# Patient Record
Sex: Female | Born: 1960 | Hispanic: Yes | Marital: Married | Smoking: Never smoker
Health system: Southern US, Community
[De-identification: ages and names within clinical notes are randomized; demographics above are authoritative.]

## PROBLEM LIST (undated history)

## (undated) DIAGNOSIS — E785 Hyperlipidemia, unspecified: Secondary | ICD-10-CM

## (undated) DIAGNOSIS — C55 Malignant neoplasm of uterus, part unspecified: Secondary | ICD-10-CM

## (undated) DIAGNOSIS — C189 Malignant neoplasm of colon, unspecified: Secondary | ICD-10-CM

## (undated) DIAGNOSIS — K5900 Constipation, unspecified: Secondary | ICD-10-CM

## (undated) DIAGNOSIS — Z8049 Family history of malignant neoplasm of other genital organs: Secondary | ICD-10-CM

## (undated) DIAGNOSIS — Z1509 Genetic susceptibility to other malignant neoplasm: Secondary | ICD-10-CM

## (undated) DIAGNOSIS — K219 Gastro-esophageal reflux disease without esophagitis: Secondary | ICD-10-CM

## (undated) DIAGNOSIS — I10 Essential (primary) hypertension: Secondary | ICD-10-CM

## (undated) DIAGNOSIS — E119 Type 2 diabetes mellitus without complications: Secondary | ICD-10-CM

## (undated) DIAGNOSIS — E669 Obesity, unspecified: Secondary | ICD-10-CM

## (undated) DIAGNOSIS — Z5189 Encounter for other specified aftercare: Secondary | ICD-10-CM

## (undated) HISTORY — DX: Encounter for other specified aftercare: Z51.89

## (undated) HISTORY — DX: Family history of malignant neoplasm of other genital organs: Z80.49

## (undated) HISTORY — PX: ABDOMINAL HYSTERECTOMY: SHX81

## (undated) HISTORY — DX: Genetic susceptibility to other malignant neoplasm: Z15.09

## (undated) HISTORY — DX: Malignant neoplasm of colon, unspecified: C18.9

## (undated) HISTORY — PX: MULTIPLE TOOTH EXTRACTIONS: SHX2053

## (undated) HISTORY — DX: Gastro-esophageal reflux disease without esophagitis: K21.9

## (undated) HISTORY — PX: UPPER GASTROINTESTINAL ENDOSCOPY: SHX188

## (undated) HISTORY — PX: OTHER SURGICAL HISTORY: SHX169

## (undated) HISTORY — DX: Hyperlipidemia, unspecified: E78.5

## (undated) HISTORY — PX: COLON SURGERY: SHX602

## (undated) HISTORY — DX: Essential (primary) hypertension: I10

## (undated) HISTORY — DX: Constipation, unspecified: K59.00

## (undated) HISTORY — DX: Malignant neoplasm of uterus, part unspecified: C55

## (undated) HISTORY — DX: Obesity, unspecified: E66.9

---

## 1991-06-16 HISTORY — PX: TUBAL LIGATION: SHX77

## 2004-06-15 HISTORY — PX: FRACTURE SURGERY: SHX138

## 2007-08-10 DIAGNOSIS — C189 Malignant neoplasm of colon, unspecified: Secondary | ICD-10-CM | POA: Insufficient documentation

## 2008-06-15 HISTORY — PX: COLON SURGERY: SHX602

## 2013-08-10 ENCOUNTER — Emergency Department (HOSPITAL_COMMUNITY): Payer: Self-pay

## 2013-08-10 ENCOUNTER — Encounter (HOSPITAL_COMMUNITY): Payer: Self-pay | Admitting: Emergency Medicine

## 2013-08-10 ENCOUNTER — Emergency Department (HOSPITAL_COMMUNITY)
Admission: EM | Admit: 2013-08-10 | Discharge: 2013-08-10 | Disposition: A | Discharge status: Home / Self Care | Payer: Self-pay | Attending: Emergency Medicine | Admitting: Emergency Medicine

## 2013-08-10 DIAGNOSIS — R0602 Shortness of breath: Secondary | ICD-10-CM | POA: Insufficient documentation

## 2013-08-10 DIAGNOSIS — Z79899 Other long term (current) drug therapy: Secondary | ICD-10-CM | POA: Insufficient documentation

## 2013-08-10 DIAGNOSIS — J329 Chronic sinusitis, unspecified: Secondary | ICD-10-CM | POA: Insufficient documentation

## 2013-08-10 DIAGNOSIS — N898 Other specified noninflammatory disorders of vagina: Secondary | ICD-10-CM | POA: Insufficient documentation

## 2013-08-10 DIAGNOSIS — R000 Tachycardia, unspecified: Secondary | ICD-10-CM | POA: Insufficient documentation

## 2013-08-10 LAB — CBC WITH DIFFERENTIAL/PLATELET
BASOS ABS: 0 10*3/uL (ref 0.0–0.1)
Basophils Relative: 0 % (ref 0–1)
EOS PCT: 2 % (ref 0–5)
Eosinophils Absolute: 0.2 10*3/uL (ref 0.0–0.7)
HEMATOCRIT: 40.1 % (ref 36.0–46.0)
Hemoglobin: 13.7 g/dL (ref 12.0–15.0)
Lymphocytes Relative: 27 % (ref 12–46)
Lymphs Abs: 2.4 10*3/uL (ref 0.7–4.0)
MCH: 29.2 pg (ref 26.0–34.0)
MCHC: 34.2 g/dL (ref 30.0–36.0)
MCV: 85.5 fL (ref 78.0–100.0)
MONO ABS: 0.5 10*3/uL (ref 0.1–1.0)
Monocytes Relative: 5 % (ref 3–12)
Neutro Abs: 5.9 10*3/uL (ref 1.7–7.7)
Neutrophils Relative %: 66 % (ref 43–77)
PLATELETS: 241 10*3/uL (ref 150–400)
RBC: 4.69 MIL/uL (ref 3.87–5.11)
RDW: 12.6 % (ref 11.5–15.5)
WBC: 9.1 10*3/uL (ref 4.0–10.5)

## 2013-08-10 LAB — I-STAT CHEM 8, ED
BUN: 7 mg/dL (ref 6–23)
CALCIUM ION: 1.19 mmol/L (ref 1.12–1.23)
Chloride: 101 mEq/L (ref 96–112)
Creatinine, Ser: 0.7 mg/dL (ref 0.50–1.10)
Glucose, Bld: 219 mg/dL — ABNORMAL HIGH (ref 70–99)
HEMATOCRIT: 44 % (ref 36.0–46.0)
Hemoglobin: 15 g/dL (ref 12.0–15.0)
Potassium: 3.8 mEq/L (ref 3.7–5.3)
SODIUM: 139 meq/L (ref 137–147)
TCO2: 23 mmol/L (ref 0–100)

## 2013-08-10 LAB — WET PREP, GENITAL
CLUE CELLS WET PREP: NONE SEEN
TRICH WET PREP: NONE SEEN
Yeast Wet Prep HPF POC: NONE SEEN

## 2013-08-10 LAB — RAPID STREP SCREEN (MED CTR MEBANE ONLY): STREPTOCOCCUS, GROUP A SCREEN (DIRECT): NEGATIVE

## 2013-08-10 LAB — D-DIMER, QUANTITATIVE: D-Dimer, Quant: <0.27 ug/mL-FEU (ref 0.00–0.48)

## 2013-08-10 LAB — URINALYSIS, ROUTINE W REFLEX MICROSCOPIC
Bilirubin Urine: NEGATIVE
GLUCOSE, UA: NEGATIVE mg/dL
HGB URINE DIPSTICK: NEGATIVE
Ketones, ur: NEGATIVE mg/dL
Leukocytes, UA: NEGATIVE
Nitrite: NEGATIVE
Protein, ur: NEGATIVE mg/dL
SPECIFIC GRAVITY, URINE: 1.006 (ref 1.005–1.030)
UROBILINOGEN UA: 0.2 mg/dL (ref 0.0–1.0)
pH: 6.5 (ref 5.0–8.0)

## 2013-08-10 MED ORDER — HYDROCODONE-ACETAMINOPHEN 5-325 MG PO TABS: 1.0000 | ORAL_TABLET | ORAL | Status: DC | PRN

## 2013-08-10 MED ORDER — SULFAMETHOXAZOLE-TRIMETHOPRIM 800-160 MG PO TABS: 1.0000 | ORAL_TABLET | Freq: Two times a day (BID) | ORAL | Status: DC

## 2013-08-10 MED ORDER — MORPHINE SULFATE 2 MG/ML IJ SOLN
2.0000 mg | Freq: Once | INTRAMUSCULAR | Status: AC
  Administered 2013-08-10: 2 mg via INTRAVENOUS
  Filled 2013-08-10: qty 1

## 2013-08-10 MED ORDER — SODIUM CHLORIDE 0.9 % IV BOLUS (SEPSIS)
1000.0000 mL | Freq: Once | INTRAVENOUS | Status: AC
  Administered 2013-08-10: 1000 mL via INTRAVENOUS

## 2013-08-10 NOTE — ED Notes (Signed)
Patient states has had sore throat x 2 wks.   Patient states is short of breath and worsens at night.   Patient is tachycardic on arrival.   Patient denies fever, nausea, vomiting.  Does state that she had chills recently.

## 2013-08-10 NOTE — ED Notes (Signed)
MD at bedside. 

## 2013-08-10 NOTE — ED Notes (Signed)
Obtaining the Interpreter phone

## 2013-08-10 NOTE — ED Provider Notes (Signed)
CSN: [id number]     Arrival date & time 08/10/13  1243 History   First MD Initiated Contact with Patient 08/10/13 1306     Chief Complaint  Patient presents with  . Shortness of Breath  . Tachycardia  . Sore Throat     (Consider location/radiation/quality/duration/timing/severity/associated sxs/prior Treatment) Patient is a 53 y.o. female presenting with pharyngitis. The history is provided by the patient. A language interpreter was used Designer, multimedia [phone]).  Sore Throat Associated symptoms include coughing and a sore throat. Pertinent negatives include no abdominal pain, myalgias, nausea or vomiting. Associated symptoms comments: She reports sore throat for the past 2 weeks. She states that multiple family members have been ill as well. No fever, nausea or vomiting. She denies abdominal pain, urinary symptoms. She does have a brownish vaginal discharge and is experiencing periodic hot flashes and is concerned she is going through menopause. Marland Kitchen    History reviewed. No pertinent past medical history. Past Surgical History  Procedure Laterality Date  . Arm surgery    . Colon surgery     No family history on file. History  Substance Use Topics  . Smoking status: Not on file  . Smokeless tobacco: Not on file  . Alcohol Use: Not on file   OB History   Grav Para Term Preterm Abortions TAB SAB Ect Mult Living                 Review of Systems  HENT: Positive for postnasal drip, sinus pressure and sore throat. Negative for trouble swallowing.   Respiratory: Positive for cough. Negative for shortness of breath.   Cardiovascular: Negative for palpitations.  Gastrointestinal: Negative for nausea, vomiting and abdominal pain.  Genitourinary: Negative for dysuria.  Musculoskeletal: Negative for myalgias.      Allergies  Review of patient's allergies indicates no known allergies.  Home Medications   Current Outpatient Rx  Name  Route  Sig  Dispense  Refill  . OVER THE  COUNTER MEDICATION   Oral   Take 1 tablet by mouth 2 (two) times daily. Pt takes one in the morning and one at night for menopause symptoms         . OVER THE COUNTER MEDICATION   Oral   Take 1 tablet by mouth 2 (two) times daily. Pt takes for cold symptoms          BP 132/88  Pulse 131  Temp(Src) 98.5 F (36.9 C) (Oral)  Resp 18  SpO2 99% Physical Exam  Constitutional: She is oriented to person, place, and time. She appears well-developed and well-nourished.  HENT:  Head: Normocephalic.  Mouth/Throat: Oropharynx is clear and moist.  Eyes: Conjunctivae are normal.  Neck: Normal range of motion. Neck supple. No thyromegaly present.  Cardiovascular: Regular rhythm.  Tachycardia present.   Pulmonary/Chest: Effort normal and breath sounds normal.  Abdominal: Soft. Bowel sounds are normal. There is no tenderness. There is no rebound and no guarding.  Musculoskeletal: Normal range of motion.  Neurological: She is alert and oriented to person, place, and time.  Skin: Skin is warm and dry. No rash noted.  Psychiatric: She has a normal mood and affect.    ED Course  Procedures (including critical care time) Labs Review Labs Reviewed  WET PREP, GENITAL - Abnormal; Notable for the following:    WBC, Wet Prep HPF POC MODERATE (*)    All other components within normal limits  I-STAT CHEM 8, ED - Abnormal; Notable for the following:  Glucose, Bld 219 (*)    All other components within normal limits  RAPID STREP SCREEN  CULTURE, GROUP A STREP  GC/CHLAMYDIA PROBE AMP  CBC WITH DIFFERENTIAL  D-DIMER, QUANTITATIVE  URINALYSIS, ROUTINE W REFLEX MICROSCOPIC  TSH  T4, FREE   Results for orders placed during the hospital encounter of 08/10/13  RAPID STREP SCREEN      Result Value Ref Range   Streptococcus, Group A Screen (Direct) NEGATIVE  NEGATIVE  WET PREP, GENITAL      Result Value Ref Range   Yeast Wet Prep HPF POC NONE SEEN  NONE SEEN   Trich, Wet Prep NONE SEEN  NONE  SEEN   Clue Cells Wet Prep HPF POC NONE SEEN  NONE SEEN   WBC, Wet Prep HPF POC MODERATE (*) NONE SEEN  CBC WITH DIFFERENTIAL      Result Value Ref Range   WBC 9.1  4.0 - 10.5 K/uL   RBC 4.69  3.87 - 5.11 MIL/uL   Hemoglobin 13.7  12.0 - 15.0 g/dL   HCT 72.6  20.3 - 55.9 %   MCV 85.5  78.0 - 100.0 fL   MCH 29.2  26.0 - 34.0 pg   MCHC 34.2  30.0 - 36.0 g/dL   RDW 74.1  63.8 - 45.3 %   Platelets 241  150 - 400 K/uL   Neutrophils Relative % 66  43 - 77 %   Neutro Abs 5.9  1.7 - 7.7 K/uL   Lymphocytes Relative 27  12 - 46 %   Lymphs Abs 2.4  0.7 - 4.0 K/uL   Monocytes Relative 5  3 - 12 %   Monocytes Absolute 0.5  0.1 - 1.0 K/uL   Eosinophils Relative 2  0 - 5 %   Eosinophils Absolute 0.2  0.0 - 0.7 K/uL   Basophils Relative 0  0 - 1 %   Basophils Absolute 0.0  0.0 - 0.1 K/uL  D-DIMER, QUANTITATIVE      Result Value Ref Range   D-Dimer, Quant <0.27  0.00 - 0.48 ug/mL-FEU  URINALYSIS, ROUTINE W REFLEX MICROSCOPIC      Result Value Ref Range   Color, Urine YELLOW  YELLOW   APPearance CLEAR  CLEAR   Specific Gravity, Urine 1.006  1.005 - 1.030   pH 6.5  5.0 - 8.0   Glucose, UA NEGATIVE  NEGATIVE mg/dL   Hgb urine dipstick NEGATIVE  NEGATIVE   Bilirubin Urine NEGATIVE  NEGATIVE   Ketones, ur NEGATIVE  NEGATIVE mg/dL   Protein, ur NEGATIVE  NEGATIVE mg/dL   Urobilinogen, UA 0.2  0.0 - 1.0 mg/dL   Nitrite NEGATIVE  NEGATIVE   Leukocytes, UA NEGATIVE  NEGATIVE  I-STAT CHEM 8, ED      Result Value Ref Range   Sodium 139  137 - 147 mEq/L   Potassium 3.8  3.7 - 5.3 mEq/L   Chloride 101  96 - 112 mEq/L   BUN 7  6 - 23 mg/dL   Creatinine, Ser 6.46  0.50 - 1.10 mg/dL   Glucose, Bld 803 (*) 70 - 99 mg/dL   Calcium, Ion 2.12  2.48 - 1.23 mmol/L   TCO2 23  0 - 100 mmol/L   Hemoglobin 15.0  12.0 - 15.0 g/dL   HCT 25.0  03.7 - 04.8 %    Imaging Review Dg Chest 2 View  08/10/2013   CLINICAL DATA:  Shortness of breath, tachycardia  EXAM: CHEST  2 VIEW  COMPARISON:  None.  FINDINGS:  The heart size and mediastinal contours are within normal limits. Both lungs are clear. The visualized skeletal structures are unremarkable.  IMPRESSION: No active cardiopulmonary disease.   Electronically Signed   By: Daryll Brod M.D.   On: 08/10/2013 14:41    EKG Interpretation    Date/Time:  Thursday August 10 2013 13:23:42 EST Ventricular Rate:  115 PR Interval:  146 QRS Duration: 74 QT Interval:  331 QTC Calculation: 458 R Axis:   63 Text Interpretation:  Sinus tachycardia LAE, consider biatrial enlargement Confirmed by DELOS  MD, DOUGLAS ([id number]) on 08/10/2013 2:08:20 PM            MDM   Final diagnoses:  None    1. Sinusitis 2. Tachycardia 3. Vaginal discharge  Tachycardia improves with IV fluids. She is evaluated by Dr. Stark Jock who feels symptoms of sore throat, sinus pressure and congestion x 2 weeks warrants abx treatment. Thyroid studies pending for follow up with primary care. She had a concern for menopausal symptoms which cannot be confirmed in the ED, however, are not thought contributory to sore throat or tachycardia. Patient is stable for discharge home.     Dewaine Oats, PA-C 08/10/13 1648

## 2013-08-10 NOTE — Discharge Instructions (Signed)
Grgaras de agua con sal ([place] Gargle) Esta solucin har que sienta alivio en la boca y la garganta. INSTRUCCIONES PARA EL CUIDADO DOMICILIARIO  Mezcle 1 cucharadita de sal en 8 onzas de agua tibia.  Haga grgaras con esta solucin con la frecuencia que necesite o segn le hayan indicado. Hgalo suavemente si tiene lesiones o lastimaduras en la boca.  No trague esta solucin. Document Released: 09/17/2008 Document Revised: 08/24/2011 [facility] Patient Information 2014 [place], [place].  Sinusitis  (Sinusitis) La sinusitis es el enrojecimiento, sensibilidad e hinchazn (inflamacin) de los senos paranasales. Los senos paranasales son bolsas de aire que se encuentran dentro de los huesos de la cara (por debajo de los ojos, en la mitad de la frente o por encima de los ojos). En los senos paranasales sanos, el moco puede drenar y el aire circula a travs de ellos en su camino hacia la Lawyer. Sin embargo, cuando se [place], el moco y el aire [place]. Esto hace que se desarrollen bacterias y otros grmenes y originen una infeccin.   La sinusitis puede desarrollarse rpidamente y durar slo un tiempo corto (aguda) o continuar por un perodo largo (crnica). La sinusitis que dura ms de 12 semanas se considera crnica.  CAUSAS  Las causas de la sinusitis son:   Advertising account executive estructurales, como el desplazamiento del cartlago que separa las fosas nasales (desvo del tabique) pueden disminuir el flujo de aire por la nariz y los senos paranasales y Print production planner su drenaje.  Las alteraciones funcionales, como cuando los pequeos pelos (cilias) que se encuentran en los senos nasales y que ayudan a eliminar la mucosidad no funcionan correctamente o no estn presentes. SNTOMAS  Los sntomas de sinusitis aguda y crnica son los mismos. Los sntomas principales son Conservation officer, historic buildings y la presin alrededor de los senos paranasales afectados. Otros sntomas son:   Agricultural consultant.  Dolor de odos.  Dolor de [place].  Mal aliento.  Disminucin del sentido del olfato y del gusto.  Tos, que empeora al [employer].  Fatiga.  Cristy Hilts.  Drenaje de moco espeso por la nariz, que generalmente es de color verde y puede contener pus (purulento).  Hinchazn y calor en los senos paranasales afectados. DIAGNSTICO  El mdico le har un examen fsico. Durante el examen, el mdico:   Revisar su nariz buscando signos de crecimientos anormales en las fosas nasales (plipos nasales).  Palpar los senos paranasales afectados para buscar signos de infeccin.  Observar el interior de los senos paranasales (endoscopa) con un dispositivo especial que emite luz (endoscopio) colocndolo dentro de los senos paranasales. Si el mdico sospecha que usted sufre sinusitis crnica, podr indicar una o ms de las siguientes pruebas:   Pruebas de Buyer, retail.  Cultivo de secreciones nasales: tomar Truddie Coco del moco nasal y la enviar a un laboratorio para detectar bacterias.  Citologa nasal: el mdico tomar [place] de moco de la nariz para determinar si la sinusitis que usted sufre est relacionada con Obie Dredge. TRATAMIENTO  La mayora de los casos de sinusitis aguda se deben a una infeccin viral y se resuelven espontneamente dentro de los 10 das. En algunos casos se recetan medicamentos para [employer] (analgsicos, descongestivos, aerosoles nasales con corticoides o aerosoles salinos).  Sin embargo, para la sinusitis por infeccin bacteriana, Camera operator. Los antibiticos son medicamentos que destruyen las bacterias que causan la infeccin.  Rara vez la sinusitis tiene su origen en una infeccin por hongos.  En estos casos, Designer, television/film set un medicamento antifngico.  Para algunos casos de sinusitis crnica, es necesario someterse a [place]. Generalmente se trata de [employer] la sinusitis se repite ms de 3 veces al  ao, a pesar de otros tratamientos.  INSTRUCCIONES PARA EL CUIDADO EN EL HOGAR   Tiene que beber gran cantidad de agua. Los lquidos ayudan a Psychologist, educational moco para que drene ms fcilmente de los senos paranasales.  Use un humidificador.  Inhale vapor de 3 a 4 veces al da (por ejemplo, sintese en el bao con la ducha abierta).  Aplique un pao tibio y hmedo en su cara 3  4 veces al da, o segn las indicaciones de su mdico.  Use un aerosol nasal salino para ayudar a Air cabin crew y [employer] senos nasales.  Tome medicamentos de venta libre o recetados para Haematologist, Health and safety inspector o la fiebre slo segn las indicaciones de su mdico. SOLICITE ATENCIN MDICA DE INMEDIATO SI:   Siente ms dolor o sufre dolores de cabeza intensos.  Tiene nuseas, vmitos o somnolencia.  Observa hinchazn alrededor del rostro.  Tiene problemas de visin.  Presenta rigidez en el cuello.  Tiene dificultad para respirar. ASEGRESE DE QUE:   Comprende estas instrucciones.  Controlar su enfermedad.  Recibir ayuda de inmediato si no mejora o si empeora. Document Released: 03/11/2005 Document Revised: 02/01/2013 [facility] Patient Information 2014 [place], [place].

## 2013-08-10 NOTE — ED Notes (Signed)
Per pt. And translator phone. Pt is experiencing coughing and sore throat with scant blood in saliva. Pt. Stated no n/v/d or fever. States that her family members have been sick as well.

## 2013-08-10 NOTE — ED Provider Notes (Signed)
MSE was initiated and I personally evaluated the patient and placed orders (if any) at  1:02 PM on August 10, 2013.  The patient appears stable so that the remainder of the MSE may be completed by another provider.   Streeter phone interpreter was used. Pt with URI sxs including nasal congestion, sore throat, cough productive with phlegm but occasional hemoptysis x 2 weeks.  No prior hx of PE or , and no significant risk factors for PE but pt is tachycardic and c/o SOB.  No fever.  Pt will need further work up to r/o PE.   BP 141/100  Pulse 131  Temp(Src) 98.5 F (36.9 C) (Oral)  Resp 20  SpO2 99%   Domenic Moras, PA-C 08/10/13 1306

## 2013-08-11 LAB — GC/CHLAMYDIA PROBE AMP
CT PROBE, AMP APTIMA: NEGATIVE
GC Probe RNA: NEGATIVE

## 2013-08-11 LAB — TSH: TSH: 3.446 u[IU]/mL (ref 0.350–4.500)

## 2013-08-11 LAB — T4, FREE: Free T4: 1.15 ng/dL (ref 0.80–1.80)

## 2013-08-12 LAB — CULTURE, GROUP A STREP

## 2013-08-12 NOTE — ED Provider Notes (Signed)
Medical screening examination/treatment/procedure(s) were performed by non-physician practitioner and as supervising physician I was immediately available for consultation/collaboration.   EKG Interpretation   Date/Time:  Thursday August 10 2013 13:23:42 EST Ventricular Rate:  115 PR Interval:  146 QRS Duration: 74 QT Interval:  331 QTC Calculation: 458 R Axis:   63 Text Interpretation:  Sinus tachycardia LAE, consider biatrial enlargement  Confirmed by Beau Fanny  MD, DOUGLAS ([id number]) on 08/10/2013 2:08:20 PM        Houston Siren III, MD 08/12/13 [phone]

## 2013-08-16 NOTE — ED Provider Notes (Signed)
Medical screening examination/treatment/procedure(s) were conducted as a shared visit with non-physician practitioner(s) and myself.  I personally evaluated the patient during the encounter. Patient presents with complaints of sore throat and vaginal discharge. This is been going on for the past 2 weeks. She denies fevers or chills. She denies vomiting or diarrhea. She does not speak [place] and history was taken with the use of the translator line.  On exam vitals are stable the patient is afebrile. Head is atraumatic normocephalic. Heart is regular rate and rhythm and lungs are clear. Oropharynx is erythematous without exudates. Abdomen is soft nontender nondistended. Extremities are without edema.  Workup reveals unremarkable laboratory studies and WBCs on the wet prep. Due to the length of symptoms she will be treated with antibiotics for suspected sinusitis. She was initially tachycardic upon presentation and this improved with IV hydration.    EKG Interpretation   Date/Time:  Thursday August 10 2013 13:23:42 EST Ventricular Rate:  115 PR Interval:  146 QRS Duration: 74 QT Interval:  331 QTC Calculation: 458 R Axis:   63 Text Interpretation:  Sinus tachycardia LAE, consider biatrial enlargement  Confirmed by Beau Fanny  MD, Nhyla Nappi ([id number]) on 08/10/2013 2:08:20 PM       Veryl Speak, MD 08/16/13 [phone]

## 2014-06-15 DIAGNOSIS — C55 Malignant neoplasm of uterus, part unspecified: Secondary | ICD-10-CM

## 2014-06-15 HISTORY — DX: Malignant neoplasm of uterus, part unspecified: C55

## 2014-07-25 ENCOUNTER — Inpatient Hospital Stay (HOSPITAL_COMMUNITY)
Admission: AD | Admit: 2014-07-25 | Discharge: 2014-07-25 | Disposition: A | Discharge status: Home / Self Care | Payer: Self-pay | Source: Ambulatory Visit | Attending: Obstetrics & Gynecology | Admitting: Obstetrics & Gynecology

## 2014-07-25 DIAGNOSIS — R1030 Lower abdominal pain, unspecified: Secondary | ICD-10-CM | POA: Insufficient documentation

## 2014-07-25 DIAGNOSIS — M549 Dorsalgia, unspecified: Secondary | ICD-10-CM | POA: Insufficient documentation

## 2014-07-25 DIAGNOSIS — N950 Postmenopausal bleeding: Secondary | ICD-10-CM | POA: Insufficient documentation

## 2014-07-25 DIAGNOSIS — N939 Abnormal uterine and vaginal bleeding, unspecified: Secondary | ICD-10-CM

## 2014-07-25 LAB — CBC
HCT: 41.3 % (ref 36.0–46.0)
Hemoglobin: 13.9 g/dL (ref 12.0–15.0)
MCH: 29.6 pg (ref 26.0–34.0)
MCHC: 33.7 g/dL (ref 30.0–36.0)
MCV: 88.1 fL (ref 78.0–100.0)
PLATELETS: 225 10*3/uL (ref 150–400)
RBC: 4.69 MIL/uL (ref 3.87–5.11)
RDW: 12.7 % (ref 11.5–15.5)
WBC: 6.7 10*3/uL (ref 4.0–10.5)

## 2014-07-25 LAB — POCT PREGNANCY, URINE: Preg Test, Ur: NEGATIVE

## 2014-07-25 LAB — URINALYSIS, ROUTINE W REFLEX MICROSCOPIC
Bilirubin Urine: NEGATIVE
Glucose, UA: NEGATIVE mg/dL
Ketones, ur: NEGATIVE mg/dL
LEUKOCYTES UA: NEGATIVE
Nitrite: NEGATIVE
PH: 6 (ref 5.0–8.0)
Protein, ur: NEGATIVE mg/dL
Specific Gravity, Urine: 1.01 (ref 1.005–1.030)
UROBILINOGEN UA: 0.2 mg/dL (ref 0.0–1.0)

## 2014-07-25 LAB — WET PREP, GENITAL
Clue Cells Wet Prep HPF POC: NONE SEEN
TRICH WET PREP: NONE SEEN
Yeast Wet Prep HPF POC: NONE SEEN

## 2014-07-25 LAB — URINE MICROSCOPIC-ADD ON

## 2014-07-25 MED ORDER — IBUPROFEN 600 MG PO TABS: 600.0000 mg | ORAL_TABLET | Freq: Four times a day (QID) | ORAL | Status: DC | PRN

## 2014-07-25 NOTE — MAU Note (Signed)
Pt states she is post menopausal, last period was 3 years ago.  Started bleeding again 5 days ago.  Only sees blood on toilet tissue when wiping.  Also having lower abd & back pain.  Noted  Brownish clot today.

## 2014-07-25 NOTE — Discharge Instructions (Signed)
Hemorragia postmenopusica (Postmenopausal Bleeding) El sangrado postmenopusico es el sangrado que tiene una mujer despus de haber entrado en la menopausia. La menopausia es el final de la edad frtil de la [place]. Despus de la menopausia, una mujer deja de ovular y de tener perodos Guadalupe Guerra.  La hemorragia postmenopusica puede tener varias causas. Cualquier tipo de hemorragia postmenopusica, incluso si parece ser un perodo menstrual tpico, es preocupante. Esto lo evaluar el mdico. Cualquier tratamiento depender de la causa del sangrado. INSTRUCCIONES PARA EL CUIDADO EN EL HOGAR Controle su afeccin para ver si hay cambios. Las siguientes indicaciones ayudarn a Chief Strategy Officer que pueda sentir:  Evite las duchas vaginales y el uso de tampones segn lo que le indique su mdico.  Lyons compresas con frecuencia.  Hgase exmenes plvicos regulares y pruebas de [place]apan[place]olaou.  Cumpla con todas las visitas de control, segn le indique su mdico. SOLICITE ATENCIN MDICA SI:   El sangrado dura ms de 1 semana.  Siente dolor abdominal.  Tiene hemorragias [place]. SOLICITE ATENCIN MDICA DE INMEDIATO SI:   Usted tiene fiebre, escalofros, mareos, dolor de cabeza, dolores musculares y Pensions consultant.  Tiene dolor con el sangrado.  Elimina cogulos de [place].  Tiene sangrado y necesita ms de un apsito por hora.  Siente que va a desmayarse. ASEGRESE DE QUE:  Comprende estas instrucciones.  Controlar su afeccin.  Recibir ayuda de inmediato si no mejora o si empeora. Document Released: 11/18/2007 Document Revised: 03/22/2013 [facility] Patient Information 2015 [place]. This information is not intended to replace advice given to you by your health care provider. Make sure you discuss any questions you have with your health care provider.

## 2014-07-25 NOTE — Progress Notes (Signed)
Pt states bleeding is a small amount  Only when she wipes

## 2014-07-25 NOTE — MAU Provider Note (Signed)
Chief Complaint: Vaginal Bleeding; Abdominal Pain; and Back Pain   First Provider Initiated Contact with Patient 07/25/14 1646     SUBJECTIVE HPI: Laura Douglas is a 54 y.o. No obstetric history on file who presents to maternity admissions reporting postmenopausal bleeding.  She reports her last menstrual period was 3 years ago and was heavy.  She has not had any vaginal bleeding for 3 years until 2 weeks ago when she started having dark brown spotting off and on. The bleeding has continued to be light, spotting only, and is accompanied by back pain and abdominal cramping.   She does not have a PCP or Gyn provider currently. She denies vaginal itching/burning, urinary symptoms, h/a, dizziness, n/v, or fever/chills.     No past medical history on file. No past surgical history on file. History   Social History  . Marital Status: Single    Spouse Name: N/A  . Number of Children: N/A  . Years of Education: N/A   Occupational History  . Not on file.   Social History Main Topics  . Smoking status: Not on file  . Smokeless tobacco: Not on file  . Alcohol Use: Not on file  . Drug Use: Not on file  . Sexual Activity: Not on file   Other Topics Concern  . Not on file   Social History Narrative  . No narrative on file   No current facility-administered medications on file prior to encounter.   No current outpatient prescriptions on file prior to encounter.   No Known Allergies  ROS: Pertinent items in HPI  OBJECTIVE Blood pressure 145/87, pulse 96, resp. rate 18, height 5\' 2"  (1.575 m), weight 73.029 kg (161 lb). GENERAL: Well-developed, well-nourished female in no acute distress.  HEENT: Normocephalic HEART: normal rate RESP: normal effort ABDOMEN: Soft, non-tender EXTREMITIES: Nontender, no edema NEURO: Alert and oriented Pelvic exam: Cervix pink, visually closed, without lesion, scant thin brown discharge, vaginal walls and external genitalia normal Bimanual  exam: Cervix 0/long/high, firm, anterior, neg CMT, uterus nontender, nonenlarged, adnexa without tenderness, enlargement, or mass  LAB RESULTS Results for orders placed or performed during the hospital encounter of 07/25/14 (from the past 24 hour(s))  Urinalysis, Routine w reflex microscopic     Status: Abnormal   Collection Time: 07/25/14  3:16 PM  Result Value Ref Range   Color, Urine YELLOW YELLOW   APPearance CLEAR CLEAR   Specific Gravity, Urine 1.010 1.005 - 1.030   pH 6.0 5.0 - 8.0   Glucose, UA NEGATIVE NEGATIVE mg/dL   Hgb urine dipstick SMALL (A) NEGATIVE   Bilirubin Urine NEGATIVE NEGATIVE   Ketones, ur NEGATIVE NEGATIVE mg/dL   Protein, ur NEGATIVE NEGATIVE mg/dL   Urobilinogen, UA 0.2 0.0 - 1.0 mg/dL   Nitrite NEGATIVE NEGATIVE   Leukocytes, UA NEGATIVE NEGATIVE  Urine microscopic-add on     Status: None   Collection Time: 07/25/14  3:16 PM  Result Value Ref Range   Squamous Epithelial / LPF RARE RARE   WBC, UA 0-2 <3 WBC/hpf   RBC / HPF 0-2 <3 RBC/hpf   Bacteria, UA RARE RARE  Pregnancy, urine POC     Status: None   Collection Time: 07/25/14  3:32 PM  Result Value Ref Range   Preg Test, Ur NEGATIVE NEGATIVE  CBC     Status: None   Collection Time: 07/25/14  4:39 PM  Result Value Ref Range   WBC 6.7 4.0 - 10.5 K/uL   RBC 4.69 3.87 -  5.11 MIL/uL   Hemoglobin 13.9 12.0 - 15.0 g/dL   HCT 41.3 36.0 - 46.0 %   MCV 88.1 78.0 - 100.0 fL   MCH 29.6 26.0 - 34.0 pg   MCHC 33.7 30.0 - 36.0 g/dL   RDW 12.7 11.5 - 15.5 %   Platelets 225 150 - 400 K/uL  Wet prep, genital     Status: Abnormal   Collection Time: 07/25/14  4:39 PM  Result Value Ref Range   Yeast Wet Prep HPF POC NONE SEEN NONE SEEN   Trich, Wet Prep NONE SEEN NONE SEEN   Clue Cells Wet Prep HPF POC NONE SEEN NONE SEEN   WBC, Wet Prep HPF POC MODERATE (A) NONE SEEN    A:  1. Postmenopausal bleeding   2. Abnormal uterine bleeding (AUB)    P: D/C home Ibuprofen 600 mg Q 6 hours PRN Outpatient  Pelvic U/S ordered Appt 1:00 on 08/01/14 for follow up in [place] Return to MAU as needed for emergencies     Follow-up Information    Follow up with [facility].   Specialty:  Radiology   Why:  El hospital le llamar con cita o llamar al nmero de abajo. The hospital will call you with appointment or call the number below.   Contact information:   [address] [id number] [place] [phone]      Follow up with [facility].   Specialty:  Obstetrics and Gynecology   Why:  [place] cita a las 1:00 horas del mircoles 08/01/13. Por favor, llame al nmero a continuacin si necesita cambiar la fecha. You have an appointment at 1:00 pm on Wednesday 08/01/13. Please call the number below if you need to reschedule.    Contact information:   [place] [phone]      Follow up with [facility].   Why:  Segn sea necesario para emergencias.  As needed for emergencies   Contact information:   [address] [phone] [place] Certified Nurse-Midwife 07/25/2014  5:16 PM

## 2014-07-26 LAB — GC/CHLAMYDIA PROBE AMP (~~LOC~~) NOT AT ARMC
CHLAMYDIA, DNA PROBE: NEGATIVE
Neisseria Gonorrhea: NEGATIVE

## 2014-08-01 ENCOUNTER — Ambulatory Visit: Payer: Self-pay | Admitting: Obstetrics & Gynecology

## 2014-08-01 ENCOUNTER — Ambulatory Visit (INDEPENDENT_AMBULATORY_CARE_PROVIDER_SITE_OTHER): Payer: Self-pay | Admitting: Obstetrics & Gynecology

## 2014-08-01 ENCOUNTER — Encounter: Payer: Self-pay | Admitting: Obstetrics & Gynecology

## 2014-08-01 ENCOUNTER — Other Ambulatory Visit (HOSPITAL_COMMUNITY)
Admission: RE | Admit: 2014-08-01 | Discharge: 2014-08-01 | Disposition: A | Discharge status: Home / Self Care | Payer: Self-pay | Source: Ambulatory Visit | Attending: Obstetrics & Gynecology | Admitting: Obstetrics & Gynecology

## 2014-08-01 VITALS — BP 150/94 | HR 113 | Temp 98.6°F | Resp 20 | Ht 60.0 in | Wt 164.9 lb

## 2014-08-01 DIAGNOSIS — N950 Postmenopausal bleeding: Secondary | ICD-10-CM | POA: Insufficient documentation

## 2014-08-01 NOTE — Progress Notes (Signed)
   Subjective:    Patient ID: Laura Douglas, female    DOB: 03/29/1961, 54 y.o.   MRN: [id number]  HPI  54 yo HP4 is here for a EMBX. She has had PMB for 2 weeks. She had been menopausal for about 3 years.  Review of Systems     Objective:   Physical Exam   UPT negative, consent signed, time out done There was a small polyp near the os but I was unable to grasp it with uterine dressing forceps. Cervix prepped with betadine and grasped with a single tooth tenaculum Uterus sounded to 9 cm Pipelle used for 3 passes with a very large amount of tissue obtained. She tolerated the procedure well.        Assessment & Plan:  PMB- await EMBX Schedule gyn u/s RTC 2 weeks

## 2014-08-02 ENCOUNTER — Telehealth: Payer: Self-pay | Admitting: General Practice

## 2014-08-02 NOTE — Telephone Encounter (Signed)
Called patient with [facility] for interpreter and informed patient of new appointment of 2/24 @ 2:45

## 2014-08-08 ENCOUNTER — Ambulatory Visit (INDEPENDENT_AMBULATORY_CARE_PROVIDER_SITE_OTHER): Payer: Self-pay | Admitting: Obstetrics & Gynecology

## 2014-08-08 ENCOUNTER — Ambulatory Visit: Payer: Self-pay | Admitting: Obstetrics & Gynecology

## 2014-08-08 ENCOUNTER — Ambulatory Visit (HOSPITAL_COMMUNITY)
Admission: RE | Admit: 2014-08-08 | Discharge: 2014-08-08 | Disposition: A | Discharge status: Home / Self Care | Payer: Self-pay | Source: Ambulatory Visit | Attending: Advanced Practice Midwife | Admitting: Advanced Practice Midwife

## 2014-08-08 DIAGNOSIS — C541 Malignant neoplasm of endometrium: Secondary | ICD-10-CM

## 2014-08-08 DIAGNOSIS — N950 Postmenopausal bleeding: Secondary | ICD-10-CM | POA: Insufficient documentation

## 2014-08-08 NOTE — Patient Instructions (Signed)
Cncer de tero (Uterine Cancer) El cncer de tero es un crecimiento de tejido anormal (tumor) en el tero, que es canceroso ([place]). A diferencia de los tumores no cancerosos (benignos) los tumores malignos pueden diseminarse a otras partes del cuerpo. La pared del tero tiene dos capas de tejido. La capa interna es el endometrio. La capa externa de tejido muscular es el miometrio. El tipo ms frecuente de cncer de tero comienza en el endometrio. Se denomina cncer de endometrio. El que se inicia en el miometrio se llama sarcoma uterino, y es muy raro.  FACTORES DE RIESGO  Aunque la causa exacta del cncer de tero no se conoce, hay una cantidad de factores de riesgo que aumentan la probabilidad de sufrir cncer de tero. Entre los que se incluyen:  La edad. El cncer de tero ocurre principalmente en mujeres mayores de 41 aos.  Tener el endometrio agrandado (hiperplasia de endometrio).  Terapia hormonal.  La obesidad.  Tomar tamoxifeno.  Ser de raza blanca.  Infertilidad.  No haber tenido embarazos.  Inicio del perodo menstrual antes de los 12 aos.  Continuar con los perodos [employer] de los 60 aos.  Antecedentes personales de cncer de ovarios, intestinos o colorrectal.  Tener antecedentes familiares de cncer de tero.  Antecedentes familiares de cncer de colon hereditario sin poliposis.  Sufrir diabetes, hipertensin arterial, enfermedad tiroidea o enfermedad de la vescula biliar.  Uso de anticonceptivos orales en altas dosis por [employer].  Exposicin a la radiacin.  Fumar. SIGNOS Y SNTOMAS   Secrecin o sangrado vaginal anormal. El sangrado puede comenzar como un flujo sanguinolento acuoso, que gradualmente contiene cada vez ms [place].  Todo sangrado luego de la menopausia.  Dificultad o dolor al [employer].  [place].  Dolor en la zona plvica.  Bultos en la vagina.  Dolor o distensin en el  abdomen.  Ganas frecuentes de Garment/textile technologist.  Hemorragias entre los perodos [employer].  Bultos en el estmago.  Prdida de peso sin causa aparente. El cncer de tero generalmente aparece despus de la menopausia. Sin embargo, tambin puede ocurrir en el momento en que la menopausia comienza. El sangrado vaginal anormal es el sntoma ms frecuente. Las mujeres no deben suponer que un sangrado vaginal anormal es parte de la menopausia. DIAGNSTICO  El [employer] preguntar sobre su historia clnica. Tambin podr realizar algunos procedimientos como:  Examen fsico. El mdico palpar la pelvis para ver si hay bultos.  Anlisis de [place] y [place].  Radiografas.  Diagnsticos por imagen, como tomografa computarizada, ecografa o resonancia magntica.  Una histeroscopia para visualizar el interior del tero.  Un Papanicolau para tomar Truddie Coco de clulas del cuello del tero y de la parte superior de la vagina, para ver si hay clulas anormales.  Truddie Coco de tejido (biopsia) de la membrana interna que recubre el tero para ver si hay clulas cancerosas.  Dilatacin y Advertising copywriter (D y L). Consiste en la apertura (dilatacin) del cuello del tero y el raspado (legrado) en la membrana interna del tero para tomar Truddie Coco de tejido. La muestra se examina en el microscopio para diagnosticar si hay clulas cancerosas. El tumor se estadificar para determinar su gravedad y extensin. La estadificacin es un intento cuidadoso para diagnosticar el tamao del tumor, si se ha propagado, y si es as, a Brewing technologist del cuerpo. Es posible que tenga que hacerse ms estudios para determinar el estadio o etapa del cncer. Los resultados de las pruebas ayudarn a Teacher, adult education  qu plan de tratamiento es el mejor para usted. Las etapas son:   Joyice Faster. El cncer se encuentra solamente en el tero.  Estadio II. El cncer se ha diseminado al cuello del tero.  Estadio III. El cncer se ha diseminado al  exterior del tero, pero no fuera de la pelvis. El cncer puede haberse diseminado a los ganglios linfticos en la pelvis.  Estadio IV. El cncer se ha extendido a Airline pilot del cuerpo, como la vejiga o el recto. TRATAMIENTO  El tratamiento de la mayora de las mujeres que sufren cncer de tero es la [place]. Esto incluye la extirpacin del tero, el cuello del tero, las trompas de Falopio y los ovarios (histerectoma total). Tambin le extirparn los ganglios linfticos que se encuentran cerca del tumor. Algunas mujeres recibirn tratamiento de radiacin, quimioterapia o terapia hormonal. Otras mujeres reciben una combinacin de estas terapias. [place] los medicamentos solamente como se lo haya indicado el mdico.  Mantenga una dieta saludable.  Haga ejercicios regularmente.  Si tiene diabetes, hipertensin arterial, enfermedad tiroidea o enfermedad de la vescula biliar, siga las indicaciones de su mdico para mantenerlas bajo control.  No fume.  Considere participar en un grupo de apoyo. Esto puede ayudarla a sobrellevar el estrs que implica sufrir cncer de tero.  Busque asesoramiento que lo ayude a [employer] secundarios del [place].  Concurra a todas las visitas de control como se lo haya indicado el mdico. SOLICITE ATENCIN MDICA SI:  Aumenta el dolor estomacal o plvico.  No puede orinar.  Tiene un sangrado anormal. Document Released: 02/01/2013 Document Revised: 10/16/2013 [facility] Patient Information 2015 [place], [place]. This information is not intended to replace advice given to you by your health care provider. Make sure you discuss any questions you have with your health care provider.

## 2014-08-09 ENCOUNTER — Encounter: Payer: Self-pay | Admitting: Obstetrics & Gynecology

## 2014-08-09 ENCOUNTER — Ambulatory Visit: Payer: Self-pay | Admitting: Obstetrics & Gynecology

## 2014-08-09 NOTE — Progress Notes (Signed)
Patient ID: Laura Douglas, female   DOB: 08-15-1960, 54 y.o.   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  I reviewed with pt her sx and her surg path.  She thought she just had polyps.  I explained to her the need for referral to GYN ONC and explained that she would need surgery.     She was apporpriately upset by the dx.  She asked appropriate questions. The questions were answered to her satisfaction.  She was informed of her visit with GYN ONC.  She reports that she will keep the appt,  82min face to face with pt was spent.  A spanish interpreter was used for the entire conversation.

## 2014-08-13 ENCOUNTER — Ambulatory Visit: Payer: Self-pay | Attending: Gynecologic Oncology | Admitting: Gynecologic Oncology

## 2014-08-13 ENCOUNTER — Ambulatory Visit: Payer: Self-pay | Admitting: Gynecologic Oncology

## 2014-08-13 ENCOUNTER — Encounter: Payer: Self-pay | Admitting: Gynecologic Oncology

## 2014-08-13 VITALS — BP 159/85 | HR 97 | Temp 98.5°F | Resp 20 | Ht 60.0 in | Wt 160.3 lb

## 2014-08-13 DIAGNOSIS — Z8719 Personal history of other diseases of the digestive system: Secondary | ICD-10-CM | POA: Insufficient documentation

## 2014-08-13 DIAGNOSIS — Z808 Family history of malignant neoplasm of other organs or systems: Secondary | ICD-10-CM

## 2014-08-13 DIAGNOSIS — Z9049 Acquired absence of other specified parts of digestive tract: Secondary | ICD-10-CM | POA: Insufficient documentation

## 2014-08-13 DIAGNOSIS — C541 Malignant neoplasm of endometrium: Secondary | ICD-10-CM | POA: Insufficient documentation

## 2014-08-13 DIAGNOSIS — Z8049 Family history of malignant neoplasm of other genital organs: Secondary | ICD-10-CM | POA: Insufficient documentation

## 2014-08-13 DIAGNOSIS — I10 Essential (primary) hypertension: Secondary | ICD-10-CM | POA: Insufficient documentation

## 2014-08-13 NOTE — Patient Instructions (Addendum)
Preparing for your Surgery  Plan for surgery on March 29 with Dr. Skeet Latch.  Pre-operative Testing -You will receive a phone call from presurgical testing at [facility] to arrange for a pre-operative testing appointment before your surgery.  This appointment normally occurs one to two weeks before your scheduled surgery.   -Bring your insurance card, copy of an advanced directive if applicable, medication list  -At that visit, you will be asked to sign a consent for a possible blood transfusion in case a transfusion becomes necessary during surgery.  The need for a blood transfusion is rare but having consent is a necessary part of your care.     -You should not be taking blood thinners or aspirin at least ten days prior to surgery unless instructed by your surgeon.  Day Before Surgery at [place] will be asked to take in only clear liquids the day before surgery.  Examples of clear liquids include broths, jello, and clear juices.  You will be advised to have nothing to eat or drink after midnight the evening before.    Your role in recovery Your role is to become active as soon as directed by your doctor, while still giving yourself time to heal.  Rest when you feel tired. You will be asked to do the following in order to speed your recovery:  - Cough and breathe deeply. This helps toclear and expand your lungs and can prevent pneumonia. You may be given a spirometer to practice deep breathing. A staff member will show you how to use the spirometer. - Do mild physical activity. Walking or moving your legs help your circulation and body functions return to normal. A staff member will help you when you try to walk and will provide you with simple exercises. Do not try to get up or walk alone the first time. - Actively manage your pain. Managing your pain lets you move in comfort. We will ask you to rate your pain on a scale of zero to 10. It is your responsibility to tell  your doctor or nurse where and how much you hurt so your pain can be treated.  Special Considerations -If you are diabetic, you may be placed on insulin after surgery to have closer control over your blood sugars to promote healing and recovery.  This does not mean that you will be discharged on insulin.  If applicable, your oral antidiabetics will be resumed when you are tolerating a solid diet.  -Your final pathology results from surgery should be available by the Friday after surgery and the results will be relayed to you when available.   Cncer de tero (Uterine Cancer) El cncer de tero es un crecimiento de tejido anormal (tumor) en el tero, que es canceroso ([place]). A diferencia de los tumores no cancerosos (benignos) los tumores malignos pueden diseminarse a otras partes del cuerpo. La pared del tero tiene dos capas de tejido. La capa interna es el endometrio. La capa externa de tejido muscular es el miometrio. El tipo ms frecuente de cncer de tero comienza en el endometrio. Se denomina cncer de endometrio. El que se inicia en el miometrio se llama sarcoma uterino, y es muy raro.  FACTORES DE RIESGO  Aunque la causa exacta del cncer de tero no se conoce, hay una cantidad de factores de riesgo que aumentan la probabilidad de sufrir cncer de tero. Entre los que se incluyen:  La edad. El cncer de tero ocurre principalmente en mujeres mayores de  [place]  Tener el endometrio agrandado (hiperplasia de endometrio).  Terapia hormonal.  La obesidad.  Tomar tamoxifeno.  Ser de raza blanca.  Infertilidad.  No haber tenido embarazos.  Inicio del perodo menstrual antes de los 12 aos.  Continuar con los perodos [employer] de los 29 aos.  Antecedentes personales de cncer de ovarios, intestinos o colorrectal.  Tener antecedentes familiares de cncer de tero.  Antecedentes familiares de cncer de colon hereditario sin poliposis.  Sufrir diabetes,  hipertensin arterial, enfermedad tiroidea o enfermedad de la vescula biliar.  Uso de anticonceptivos orales en altas dosis por [employer].  Exposicin a la radiacin.  Fumar. SIGNOS Y SNTOMAS   Secrecin o sangrado vaginal anormal. El sangrado puede comenzar como un flujo sanguinolento acuoso, que gradualmente contiene cada vez ms [place].  Todo sangrado luego de la menopausia.  Dificultad o dolor al [employer].  [place].  Dolor en la zona plvica.  Bultos en la vagina.  Dolor o distensin en el abdomen.  Ganas frecuentes de Garment/textile technologist.  Hemorragias entre los perodos [employer].  Bultos en el estmago.  Prdida de peso sin causa aparente. El cncer de tero generalmente aparece despus de la menopausia. Sin embargo, tambin puede ocurrir en el momento en que la menopausia comienza. El sangrado vaginal anormal es el sntoma ms frecuente. Las mujeres no deben suponer que un sangrado vaginal anormal es parte de la menopausia. DIAGNSTICO  El [employer] preguntar sobre su historia clnica. Tambin podr realizar algunos procedimientos como:  Examen fsico. El mdico palpar la pelvis para ver si hay bultos.  Anlisis de [place] y [place].  Radiografas.  Diagnsticos por imagen, como tomografa computarizada, ecografa o resonancia magntica.  Una histeroscopia para visualizar el interior del tero.  Un Papanicolau para tomar Truddie Coco de clulas del cuello del tero y de la parte superior de la vagina, para ver si hay clulas anormales.  Truddie Coco de tejido (biopsia) de la membrana interna que recubre el tero para ver si hay clulas cancerosas.  Dilatacin y Advertising copywriter (D y L). Consiste en la apertura (dilatacin) del cuello del tero y el raspado (legrado) en la membrana interna del tero para tomar Truddie Coco de tejido. La muestra se examina en el microscopio para diagnosticar si hay clulas cancerosas. El tumor se estadificar para  determinar su gravedad y extensin. La estadificacin es un intento cuidadoso para diagnosticar el tamao del tumor, si se ha propagado, y si es as, a Brewing technologist del cuerpo. Es posible que tenga que hacerse ms estudios para determinar el estadio o etapa del cncer. Los resultados de las pruebas ayudarn a Transport planner plan de tratamiento es el mejor para usted. Las etapas son:   Joyice Faster. El cncer se encuentra solamente en el tero.  Estadio II. El cncer se ha diseminado al cuello del tero.  Estadio III. El cncer se ha diseminado al exterior del tero, pero no fuera de la pelvis. El cncer puede haberse diseminado a los ganglios linfticos en la pelvis.  Estadio IV. El cncer se ha extendido a Airline pilot del cuerpo, como la vejiga o el recto. TRATAMIENTO  El tratamiento de la mayora de las mujeres que sufren cncer de tero es la [place]. Esto incluye la extirpacin del tero, el cuello del tero, las trompas de Falopio y los ovarios (histerectoma total). Tambin le extirparn los ganglios linfticos que se encuentran cerca del tumor. Algunas mujeres recibirn tratamiento de radiacin, quimioterapia o terapia hormonal. Otras mujeres reciben  una combinacin de estas terapias. [place] los medicamentos solamente como se lo haya indicado el mdico.  Mantenga una dieta saludable.  Haga ejercicios regularmente.  Si tiene diabetes, hipertensin arterial, enfermedad tiroidea o enfermedad de la vescula biliar, siga las indicaciones de su mdico para mantenerlas bajo control.  No fume.  Considere participar en un grupo de apoyo. Esto puede ayudarla a sobrellevar el estrs que implica sufrir cncer de tero.  Busque asesoramiento que lo ayude a [employer] secundarios del [place].  Concurra a todas las visitas de control como se lo haya indicado el mdico. SOLICITE ATENCIN MDICA SI:  Aumenta el dolor estomacal o plvico.  No  puede orinar.  Tiene un sangrado anormal. Document Released: 02/01/2013 Document Revised: 10/16/2013 [facility] Patient Information 2015 [place], [place]. This information is not intended to replace advice given to you by your health care provider. Make sure you discuss any questions you have with your health care provider.                 Preparing for your Surgery  Plan for surgery on March 29 with Dr. Skeet Latch.  Pre-operative Testing -You will receive a phone call from presurgical testing at [facility] to arrange for a pre-operative testing appointment before your surgery.  This appointment normally occurs one to two weeks before your scheduled surgery.   -Bring your insurance card, copy of an advanced directive if applicable, medication list  -At that visit, you will be asked to sign a consent for a possible blood transfusion in case a transfusion becomes necessary during surgery.  The need for a blood transfusion is rare but having consent is a necessary part of your care.     -You should not be taking blood thinners or aspirin at least ten days prior to surgery unless instructed by your surgeon.  Day Before Surgery at [place] will be asked to take in only clear liquids the day before surgery.  Examples of clear liquids include broths, jello, and clear juices. You will be advised to have nothing to eat or drink after midnight the evening before.    Your role in recovery Your role is to become active as soon as directed by your doctor, while still giving yourself time to heal.  Rest when you feel tired. You will be asked to do the following in order to speed your recovery:  - Cough and breathe deeply. This helps toclear and expand your lungs and can prevent pneumonia. You may be given a spirometer to practice deep breathing. A staff member will show you how to use the spirometer. - Do mild physical activity. Walking or moving your legs help your circulation and body functions  return to normal. A staff member will help you when you try to walk and will provide you with simple exercises. Do not try to get up or walk alone the first time. - Actively manage your pain. Managing your pain lets you move in comfort. We will ask you to rate your pain on a scale of zero to 10. It is your responsibility to tell your doctor or nurse where and how much you hurt so your pain can be treated.  Special Considerations -If you are diabetic, you may be placed on insulin after surgery to have closer control over your blood sugars to promote healing and recovery.  This does not mean that you will be discharged on insulin.  If applicable,  your oral antidiabetics will be resumed when you are tolerating a solid diet.  -Your final pathology results from surgery should be available by the Friday after surgery and the results will be relayed to you when available.   Transfursin de sangre  (Blood Transfusion) En una transfusin se repone la sangre o algunos de sus componentes. Debe reponerse si ha perdido [place] debido a [place], un accidente, o debido a trastornos graves como la anemia. Puede donar sangre para que se vuelva a Risk manager en usted mismo si tiene planeado realizarse [place]. Si pierde sangre durante esa [place], puede utilizarse su misma sangre para reponerla. Toda la sangre que se utilice en usted se verifica para asegurarse de que concuerde el grupo sanguneo. Se controlarn con frecuencia la temperatura, la presin arterial, y la frecuencia cardaca (signos vitales).  SOLICITE AYUDA DE INMEDIATO SI:   Si tiene malestar estomacal (nuseas) o vmitos.  Tiene materia fecal lquida (diarrea).  Le falta el aire o tiene dificultad para respirar.  Observa sangre en su orina, o tiene orina de color oscuro.  Siente dolor o presin en el pecho.  Sus ojos o su piel se vuelven amarillos (ictericia).  La temperatura oral le sube a ms de 102 F (38.9 C) y no puede bajarla con  medicamentos.  Comienza a temblar y tiene escalofros.  Observa una erupcin roja (urticaria) o siente picazn.  Se siente mareado o confundido.  Siente dolor en la espalda las articulaciones, o los msculos.  No siente hambre (prdida del apetito).  Se siente cansado, inquieto o nervioso.  Tiene clicos en el vientre (abdominales). Document Released: 07/04/2010 Document Revised: 08/24/2011 [facility] Patient Information 2015 [place]. This information is not intended to replace advice given to you by your health care provider. Make sure you discuss any questions you have with your health care provider.

## 2014-08-13 NOTE — Progress Notes (Signed)
Consult Note: Gyn-Onc  Consult was requested by Dr. Hulan Fray for the evaluation of Laura Douglas 54 y.o. 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 endometrial cancer    Assessment/Plan:  [name]  is a 54 y.o.  year old [phone] with clinical stage I endometrial adenocarcinoma and possible personal history of colon cancer (s/p colonic resection in 2009).   A detailed discussion was held with the patient with an interpreter present to translate. Discussion was made with regard to to her endometrial cancer diagnosis. We discussed the standard management options for uterine cancer which includes surgery followed possibly by adjuvant therapy depending on the results of surgery. The options for surgical management include a hysterectomy and removal of the tubes and ovaries possibly with removal of pelvic and para-aortic lymph nodes. A minimally invasive approach including a robotic hysterectomy or laparoscopic hysterectomy have benefits including shorter hospital stay, recovery time and better wound healing. The alternative approach is an open hysterectomy. The patient has been counseled about these surgical options and the risks of surgery in general including infection, bleeding, damage to surrounding structures (including bowel, bladder, ureters, nerves or vessels), and the postoperative risks of PE/ DVT, and lymphedema. I extensively reviewed the additional risks of robotic hysterectomy including possible need for conversion to open laparotomy.  I discussed positioning during surgery of trendelenberg and risks of minor facial swelling and care we take in preoperative positioning.  I discussed that she is at an increased risk for conversion to laparotomy secondary to adhesive disease due to her prior laparotomy and colon resection. After counseling and consideration of her options, she desires  to proceed with robotic hysterectomy, BSO, lymph node dissection.   She will be seen by anesthesia for preoperative clearance and discussion of postoperative pain management.  She was given the opportunity to ask questions, which were answered to her satisfaction, and she is agreement with the above mentioned plan of care.  Given her young age at diagnosis and possible prior history of colon cancer I have concerns that she may carry deletion in a microsatellite instability gene and recommends workup for Lynch syndrome postoperatively.   HPI: Laura Douglas is a 54 year old woman who is seen in consultation at the request of Dr Hulan Fray for endometrial cancer. She has a history of 2 weeks of postmenopausal bleeding in January 2016. She been menopausal for about 3 years prior to that time. She sought evaluation with OB/GYN and Dr. Hulan Fray who performed a transvaginal ultrasound scan which revealed a 7.4 x 2.7 x 4 cm uterus with a 10 mm endometrial stripe. The ovaries were grossly normal bilaterally. To follow up the thickened endometrial stripe Dr. Hulan Fray performed an endometrial [place] biopsy on 08/01/2014 which revealed well-differentiated endometrial adenocarcinoma associated with complex atypical hyperplasia.  The patient has no primary care doctor. She is an immigrant from [place]. She has hypertension but has not been treated for this.  She reports a history of resection of a colon tumor 7 years ago in [place]. She denies HEENT told that she had cancer. She denies any other family history significant for malignancy other than a paternal aunt with cervical cancer.  Current Meds:  Outpatient Encounter Prescriptions as of 08/13/2014  Medication Sig  . ibuprofen (ADVIL,MOTRIN) 600 MG tablet Take 1 tablet (600 mg total) by mouth every 6 (six) hours as needed.    Allergy: No Known Allergies  Social Hx:  History   Social History  . Marital Status: Single    Spouse Name: N/A  . Number of Children: N/A  .  Years of Education: N/A   Occupational History  . Not on file.   Social History Main Topics  . Smoking status: Never Smoker   . Smokeless tobacco: Not on file  . Alcohol Use: Yes     Comment: occasionally  . Drug Use: No  . Sexual Activity: Yes    Birth Control/ Protection: Surgical   Other Topics Concern  . Not on file   Social History Narrative    Past Surgical Hx:  Past Surgical History  Procedure Laterality Date  . Fracture surgery  2006    Lt lower arm  . Colon surgery  2010    tumor removal  . Tubal ligation  1993  . Multiple tooth extractions      Past Medical Hx:  Past Medical History  Diagnosis Date  . Hypertension   . Obesity   . Colon cancer     possible? Patient had surgery in [place]. Denies needing adjuvant therapy. The surgery "removed a tumor from the colon".    Past Gynecological History:  SVD x 4  No LMP recorded. Patient is postmenopausal.  Family Hx:  Family History  Problem Relation Age of Onset  . Cancer Father     prostate  . Diabetes Mother     Review of Systems:  Constitutional  Feels well,    ENT Normal appearing ears and nares bilaterally Skin/Breast  No rash, sores, jaundice, itching, dryness Cardiovascular  No chest pain, shortness of breath, or edema  Pulmonary  No cough or wheeze.  Gastro Intestinal  No nausea, vomitting, or diarrhoea. No bright red blood per rectum, no abdominal pain, change in bowel movement, or constipation.  Genito Urinary  No frequency, urgency, dysuria,  Musculo Skeletal  No myalgia, arthralgia, joint swelling or pain  Neurologic  No weakness, numbness, change in gait,  Psychology  No depression, anxiety, insomnia.   Vitals:  Blood pressure 159/85, pulse 97, temperature 98.5 F (36.9 C), temperature source Oral, resp. rate 20, height 5' (1.524 m), weight 160 lb 4.8 oz (72.712 kg).  Physical Exam: WD in NAD Neck  Supple NROM, without any enlargements.  Lymph Node Survey No cervical  supraclavicular or inguinal adenopathy Cardiovascular  Pulse normal rate, regularity and rhythm. S1 and S2 normal.  Lungs  Clear to auscultation bilateraly, without wheezes/crackles/rhonchi. Good air movement.  Skin  No rash/lesions/breakdown  Psychiatry  Alert and oriented to person, place, and time  Abdomen  Vertical midline incision well healed. Normoactive bowel sounds, abdomen soft, non-tender and obese without evidence of hernia.  Back No CVA tenderness Genito Urinary  Vulva/vagina: Normal external female genitalia.  No lesions. No discharge or bleeding.  Bladder/urethra:  No lesions or masses, well supported bladder  Vagina: normal  Cervix: Normal appearing, no lesions.  Uterus: Small, mobile, no parametrial involvement or nodularity.  Adnexa: no palpable masses. Rectal  Good tone, no masses no cul de sac nodularity.  Extremities  No bilateral cyanosis, clubbing or edema.   Donaciano Eva, MD   08/13/2014, 1:56 PM

## 2014-08-15 ENCOUNTER — Telehealth: Payer: Self-pay | Admitting: *Deleted

## 2014-08-15 NOTE — Telephone Encounter (Signed)
Patient does not have a primary care MD. Made patient an appt on Thursday, 08/23/14 at 10:30am at [facility] at [facility] to establish care. Spoke with Adela Lank and she states patient needs to come by the clinic anytime before her scheduled appt to pick up paperwork. Patient does not speak [place]. Information passed along to Hamel (interpreter) who is agreeable to tell patient.

## 2014-08-16 ENCOUNTER — Ambulatory Visit: Payer: Self-pay | Admitting: Gynecologic Oncology

## 2014-08-17 ENCOUNTER — Ambulatory Visit: Payer: Self-pay | Admitting: Obstetrics & Gynecology

## 2014-08-20 ENCOUNTER — Encounter: Payer: Self-pay | Admitting: *Deleted

## 2014-09-03 ENCOUNTER — Encounter (HOSPITAL_COMMUNITY): Payer: Self-pay

## 2014-09-03 ENCOUNTER — Encounter (HOSPITAL_COMMUNITY)
Admission: RE | Admit: 2014-09-03 | Discharge: 2014-09-03 | Disposition: A | Discharge status: Home / Self Care | Payer: Self-pay | Source: Ambulatory Visit | Attending: Gynecologic Oncology | Admitting: Gynecologic Oncology

## 2014-09-03 ENCOUNTER — Ambulatory Visit (HOSPITAL_COMMUNITY)
Admission: RE | Admit: 2014-09-03 | Discharge: 2014-09-03 | Disposition: A | Discharge status: Home / Self Care | Payer: Self-pay | Source: Ambulatory Visit | Attending: Gynecologic Oncology | Admitting: Gynecologic Oncology

## 2014-09-03 DIAGNOSIS — C541 Malignant neoplasm of endometrium: Secondary | ICD-10-CM

## 2014-09-03 DIAGNOSIS — Z01818 Encounter for other preprocedural examination: Secondary | ICD-10-CM | POA: Insufficient documentation

## 2014-09-03 LAB — URINALYSIS, ROUTINE W REFLEX MICROSCOPIC
Bilirubin Urine: NEGATIVE
Glucose, UA: >1000 mg/dL — AB
KETONES UR: NEGATIVE mg/dL
Leukocytes, UA: NEGATIVE
NITRITE: POSITIVE — AB
PROTEIN: NEGATIVE mg/dL
Specific Gravity, Urine: 1.017 (ref 1.005–1.030)
Urobilinogen, UA: 0.2 mg/dL (ref 0.0–1.0)
pH: 5 (ref 5.0–8.0)

## 2014-09-03 LAB — CBC WITH DIFFERENTIAL/PLATELET
BASOS ABS: 0 10*3/uL (ref 0.0–0.1)
BASOS PCT: 0 % (ref 0–1)
Eosinophils Absolute: 0.1 10*3/uL (ref 0.0–0.7)
Eosinophils Relative: 2 % (ref 0–5)
HCT: 41.8 % (ref 36.0–46.0)
Hemoglobin: 13.8 g/dL (ref 12.0–15.0)
Lymphocytes Relative: 38 % (ref 12–46)
Lymphs Abs: 2.4 10*3/uL (ref 0.7–4.0)
MCH: 29.2 pg (ref 26.0–34.0)
MCHC: 33 g/dL (ref 30.0–36.0)
MCV: 88.6 fL (ref 78.0–100.0)
MONO ABS: 0.4 10*3/uL (ref 0.1–1.0)
Monocytes Relative: 6 % (ref 3–12)
NEUTROS ABS: 3.4 10*3/uL (ref 1.7–7.7)
NEUTROS PCT: 54 % (ref 43–77)
Platelets: 221 10*3/uL (ref 150–400)
RBC: 4.72 MIL/uL (ref 3.87–5.11)
RDW: 12.5 % (ref 11.5–15.5)
WBC: 6.3 10*3/uL (ref 4.0–10.5)

## 2014-09-03 LAB — COMPREHENSIVE METABOLIC PANEL
ALBUMIN: 4.4 g/dL (ref 3.5–5.2)
ALT: 52 U/L — ABNORMAL HIGH (ref 0–35)
AST: 42 U/L — ABNORMAL HIGH (ref 0–37)
Alkaline Phosphatase: 64 U/L (ref 39–117)
Anion gap: 12 (ref 5–15)
BUN: 12 mg/dL (ref 6–23)
CO2: 24 mmol/L (ref 19–32)
CREATININE: 0.76 mg/dL (ref 0.50–1.10)
Calcium: 10 mg/dL (ref 8.4–10.5)
Chloride: 101 mmol/L (ref 96–112)
GFR calc Af Amer: >90 mL/min (ref >90)
Glucose, Bld: 258 mg/dL — ABNORMAL HIGH (ref 70–99)
Potassium: 4.1 mmol/L (ref 3.5–5.1)
Sodium: 137 mmol/L (ref 135–145)
Total Bilirubin: 0.4 mg/dL (ref 0.3–1.2)
Total Protein: 7.6 g/dL (ref 6.0–8.3)

## 2014-09-03 LAB — URINE MICROSCOPIC-ADD ON

## 2014-09-03 NOTE — Progress Notes (Signed)
Urinalysis and micro results in epic per PAT visit 09/03/2014 sent to Dr Skeet Latch

## 2014-09-03 NOTE — Progress Notes (Signed)
Pt states she signs her name legally as Laura Douglas but pts identification states Laura Douglas-copy of pts identification card placed in chart

## 2014-09-03 NOTE — Patient Instructions (Signed)
[place]  09/03/2014   Your procedure is scheduled on:     Tuesday September 11, 2014  Report to [facility] Main Entrance and follow signs to  [place] arrive at 5:30 AM.   Call this number if you have problems the morning of surgery [phone] or Presurgical Testing [phone].   Remember:  Do not eat food or drink liquids :After Midnight.  For Living Will and/or Health Care Power Attorney Forms: please provide copy for your medical record, may bring AM of surgery (forms should be already notarized-we do not provide this service).     Take these medicines the morning of surgery with A SIP OF WATER: NONE                               You may not have any metal on your body including hair pins and piercings  Do not wear jewelry, make-up, lotions, powders, prefumes or deodorant.  Do not shave body hair  48 hours(2 days) of CHG soap use.                Do not bring valuables to the hospital. [place].  Contacts, dentures or bridgework may not be worn into surgery.  Leave suitcase in the car. After surgery it may be brought to your room.  For patients admitted to the hospital, checkout time is 11:00 AM the day of discharge.     Special Instructions: review fact sheets for  Blood Transfusion fact sheet, Incentive Spirometry. ________________________________________________________________________  [facility] - Preparing for Surgery Before surgery, you can play an important role.  Because skin is not sterile, your skin needs to be as free of germs as possible.  You can reduce the number of germs on your skin by washing with CHG (chlorahexidine gluconate) soap before surgery.  CHG is an antiseptic cleaner which kills germs and bonds with the skin to continue killing germs even after washing. Please DO NOT use if you have an allergy to CHG or antibacterial soaps.  If your skin becomes reddened/irritated stop using the CHG  and inform your nurse when you arrive at Short Stay. Do not shave (including legs and underarms) for at least 48 hours prior to the first CHG shower.  You may shave your face/neck. Please follow these instructions carefully:  1.  Shower with CHG Soap the night before surgery and the  morning of Surgery.  2.  If you choose to wash your hair, wash your hair first as usual with your  normal  shampoo.  3.  After you shampoo, rinse your hair and body thoroughly to remove the  shampoo.                           4.  Use CHG as you would any other liquid soap.  You can apply chg directly  to the skin and wash                       Gently with a scrungie or clean washcloth.  5.  Apply the CHG Soap to your body ONLY FROM THE NECK DOWN.   Do not use on face/ open                           Wound  or open sores. Avoid contact with eyes, ears mouth and genitals (private parts).                       Wash face,  Genitals (private parts) with your normal soap.             6.  Wash thoroughly, paying special attention to the area where your surgery  will be performed.  7.  Thoroughly rinse your body with warm water from the neck down.  8.  DO NOT shower/wash with your normal soap after using and rinsing off  the CHG Soap.                9.  Pat yourself dry with a clean towel.            10.  Wear clean pajamas.            11.  Place clean sheets on your bed the night of your first shower and do not  sleep with pets. Day of Surgery : Do not apply any lotions/deodorants the morning of surgery.  Please wear clean clothes to the hospital/surgery center.  FAILURE TO FOLLOW THESE INSTRUCTIONS MAY RESULT IN THE CANCELLATION OF YOUR SURGERY PATIENT SIGNATURE_________________________________  NURSE SIGNATURE__________________________________  ________________________________________________________________________   Laura Douglas  An incentive spirometer is a tool that can help keep your lungs clear and  active. This tool measures how well you are filling your lungs with each breath. Taking long deep breaths may help reverse or decrease the chance of developing breathing (pulmonary) problems (especially infection) following:  A long period of time when you are unable to move or be active. BEFORE THE PROCEDURE   If the spirometer includes an indicator to show your best effort, your nurse or respiratory therapist will set it to a desired goal.  If possible, sit up straight or lean slightly forward. Try not to slouch.  Hold the incentive spirometer in an upright position. INSTRUCTIONS FOR USE   Sit on the edge of your bed if possible, or sit up as far as you can in bed or on a chair.  Hold the incentive spirometer in an upright position.  Breathe out normally.  Place the mouthpiece in your mouth and seal your lips tightly around it.  Breathe in slowly and as deeply as possible, raising the piston or the ball toward the top of the column.  Hold your breath for 3-5 seconds or for as long as possible. Allow the piston or ball to fall to the bottom of the column.  Remove the mouthpiece from your mouth and breathe out normally.  Rest for a few seconds and repeat Steps 1 through 7 at least 10 times every 1-2 hours when you are awake. Take your time and take a few normal breaths between deep breaths.  The spirometer may include an indicator to show your best effort. Use the indicator as a goal to work toward during each repetition.  After each set of 10 deep breaths, practice coughing to be sure your lungs are clear. If you have an incision (the cut made at the time of surgery), support your incision when coughing by placing a pillow or rolled up towels firmly against it. Once you are able to get out of bed, walk around indoors and cough well. You may stop using the incentive spirometer when instructed by your caregiver.  RISKS AND COMPLICATIONS  Take your time so you do not get dizzy  or  light-headed.  If you are in pain, you may need to take or ask for pain medication before doing incentive spirometry. It is harder to take a deep breath if you are having pain. AFTER USE  Rest and breathe slowly and easily.  It can be helpful to keep track of a log of your progress. Your caregiver can provide you with a simple table to help with this. If you are using the spirometer at home, follow these instructions: [place] IF:   You are having difficultly using the spirometer.  You have trouble using the spirometer as often as instructed.  Your pain medication is not giving enough relief while using the spirometer.  You develop fever of 100.5 F (38.1 C) or higher. SEEK IMMEDIATE MEDICAL CARE IF:   You cough up bloody sputum that had not been present before.  You develop fever of 102 F (38.9 C) or greater.  You develop worsening pain at or near the incision site. MAKE SURE YOU:   Understand these instructions.  Will watch your condition.  Will get help right away if you are not doing well or get worse. Document Released: 10/12/2006 Document Revised: 08/24/2011 Document Reviewed: 12/13/2006 ExitCare Patient Information 2014 ExitCare, [place].   ________________________________________________________________________  WHAT IS A BLOOD TRANSFUSION? Blood Transfusion Information  A transfusion is the replacement of blood or some of its parts. Blood is made up of multiple cells which provide different functions.  Red blood cells carry oxygen and are used for blood loss replacement.  White blood cells fight against infection.  Platelets control bleeding.  Plasma helps clot blood.  Other blood products are available for specialized needs, such as hemophilia or other clotting disorders. BEFORE THE TRANSFUSION  Who gives blood for transfusions?   Healthy volunteers who are fully evaluated to make sure their blood is safe. This is blood bank  blood. Transfusion therapy is the safest it has ever been in the practice of medicine. Before blood is taken from a donor, a complete history is taken to make sure that person has no history of diseases nor engages in risky social behavior (examples are intravenous drug use or sexual activity with multiple partners). The donor's travel history is screened to minimize risk of transmitting infections, such as malaria. The donated blood is tested for signs of infectious diseases, such as HIV and hepatitis. The blood is then tested to be sure it is compatible with you in order to minimize the chance of a transfusion reaction. If you or a relative donates blood, this is often done in anticipation of surgery and is not appropriate for emergency situations. It takes many days to process the donated blood. RISKS AND COMPLICATIONS Although transfusion therapy is very safe and saves many lives, the main dangers of transfusion include:   Getting an infectious disease.  Developing a transfusion reaction. This is an allergic reaction to something in the blood you were given. Every precaution is taken to prevent this. The decision to have a blood transfusion has been considered carefully by your caregiver before blood is given. Blood is not given unless the benefits outweigh the risks. AFTER THE TRANSFUSION  Right after receiving a blood transfusion, you will usually feel much better and more energetic. This is especially true if your red blood cells have gotten low (anemic). The transfusion raises the level of the red blood cells which carry oxygen, and this usually causes an energy increase.  The nurse administering the transfusion will monitor  you carefully for complications. HOME CARE INSTRUCTIONS  No special instructions are needed after a transfusion. You may find your energy is better. Speak with your caregiver about any limitations on activity for underlying diseases you may have. SEEK MEDICAL CARE IF:    Your condition is not improving after your transfusion.  You develop redness or irritation at the intravenous (IV) site. SEEK IMMEDIATE MEDICAL CARE IF:  Any of the following symptoms occur over the next 12 hours:  Shaking chills.  You have a temperature by mouth above 102 F (38.9 C), not controlled by medicine.  Chest, back, or muscle pain.  People around you feel you are not acting correctly or are confused.  Shortness of breath or difficulty breathing.  Dizziness and fainting.  You get a rash or develop hives.  You have a decrease in urine output.  Your urine turns a dark color or changes to pink, red, or brown. Any of the following symptoms occur over the next 10 days:  You have a temperature by mouth above 102 F (38.9 C), not controlled by medicine.  Shortness of breath.  Weakness after normal activity.  The white part of the eye turns yellow (jaundice).  You have a decrease in the amount of urine or are urinating less often.  Your urine turns a dark color or changes to pink, red, or brown. Document Released: 05/29/2000 Document Revised: 08/24/2011 Document Reviewed: 01/16/2008 [facility] Patient Information 2014 [place], [place].  _______________________________________________________________________

## 2014-09-04 ENCOUNTER — Other Ambulatory Visit: Payer: Self-pay | Admitting: *Deleted

## 2014-09-04 DIAGNOSIS — C541 Malignant neoplasm of endometrium: Secondary | ICD-10-CM

## 2014-09-04 NOTE — Progress Notes (Signed)
CMP results in epic per PAT visit 09/03/2014 sent to Dr Skeet Latch

## 2014-09-04 NOTE — Telephone Encounter (Addendum)
Called [facility] to confirm if patient made it to appt on 08/23/14. Patient did keep appt and was given orange card for future visits (her co-pay will be $20/visit). Patient did not schedule a new patient MD visit though. There are no new patient appts until mid May at this clinic. Was given contact information for [facility] who also accepts the orange card. Called and spoke with Varney Biles and she scheduled patient for 10/11/14 at 9:30am. Clinic address is [address]. Almyra Free (interpreter) is agreeable to pass information along to the patient.

## 2014-09-05 ENCOUNTER — Telehealth: Payer: Self-pay | Admitting: *Deleted

## 2014-09-05 ENCOUNTER — Other Ambulatory Visit: Payer: Self-pay | Admitting: *Deleted

## 2014-09-05 DIAGNOSIS — R7309 Other abnormal glucose: Secondary | ICD-10-CM

## 2014-09-05 LAB — HEMOGLOBIN A1C
Hgb A1c MFr Bld: 8 % — ABNORMAL HIGH (ref 4.8–5.6)
MEAN PLASMA GLUCOSE: 183 mg/dL

## 2014-09-05 MED ORDER — METFORMIN HCL 500 MG PO TABS
500.0000 mg | ORAL_TABLET | Freq: Every day | ORAL | Status: DC
Start: 2014-09-05 — End: 2015-06-03

## 2014-09-05 NOTE — Telephone Encounter (Signed)
Per Dr. Denman George, patient started on metformin 500mg  tablets once daily. Patient called with the assistance of Almyra Free (interpreter) and given information on this medication. Patient agreeable to pickup medication at her pharmacy. While on the phone, it was stressed that patient needs to keep appt on 10/11/14 to establish PCP at [facility] and patient notified that Dr. Denman George will only prescribe this medicine pre-op since she cannot get an appt until April to establish care.  Called Varney Biles back at [facility] and she is agreeable to mail new patient packet to mailing address confirmed with patient.

## 2014-09-05 NOTE — Telephone Encounter (Signed)
Labs, Dr. Serita Grit note, and information about patient starting on metformin faxed to [facility]. Called and spoke with Varney Biles and confirmed she got records.

## 2014-09-06 LAB — URINE CULTURE

## 2014-09-07 ENCOUNTER — Telehealth: Payer: Self-pay | Admitting: *Deleted

## 2014-09-07 ENCOUNTER — Other Ambulatory Visit: Payer: Self-pay | Admitting: *Deleted

## 2014-09-07 DIAGNOSIS — B9620 Unspecified Escherichia coli [E. coli] as the cause of diseases classified elsewhere: Secondary | ICD-10-CM

## 2014-09-07 DIAGNOSIS — N390 Urinary tract infection, site not specified: Principal | ICD-10-CM

## 2014-09-07 MED ORDER — CIPROFLOXACIN HCL 250 MG PO TABS: ORAL_TABLET | ORAL | Status: DC

## 2014-09-07 NOTE — Telephone Encounter (Signed)
Patient notified (with assistance of Almyra Free - Spanish interpreter) that Cipro has been sent into the pharmacy for her to take as recent urine culture shows she has a UTI. Patient notified that prescription will be on the $4 list at [facility]. Patient agreeable to pickup antibiotic and take first dose tonight.

## 2014-09-11 ENCOUNTER — Encounter (HOSPITAL_COMMUNITY): Payer: Self-pay | Admitting: *Deleted

## 2014-09-11 ENCOUNTER — Ambulatory Visit (HOSPITAL_COMMUNITY): Payer: Self-pay | Admitting: Certified Registered"

## 2014-09-11 ENCOUNTER — Encounter (HOSPITAL_COMMUNITY)
Admission: RE | Disposition: A | Discharge status: Home / Self Care | Payer: Self-pay | Source: Ambulatory Visit | Attending: Obstetrics & Gynecology

## 2014-09-11 ENCOUNTER — Ambulatory Visit (HOSPITAL_COMMUNITY)
Admission: RE | Admit: 2014-09-11 | Discharge: 2014-09-12 | Disposition: A | Discharge status: Home / Self Care | Payer: Self-pay | Source: Ambulatory Visit | Attending: Obstetrics & Gynecology | Admitting: Obstetrics & Gynecology

## 2014-09-11 DIAGNOSIS — E669 Obesity, unspecified: Secondary | ICD-10-CM | POA: Insufficient documentation

## 2014-09-11 DIAGNOSIS — C541 Malignant neoplasm of endometrium: Secondary | ICD-10-CM | POA: Insufficient documentation

## 2014-09-11 DIAGNOSIS — N950 Postmenopausal bleeding: Secondary | ICD-10-CM

## 2014-09-11 HISTORY — PX: ROBOTIC ASSISTED TOTAL HYSTERECTOMY WITH BILATERAL SALPINGO OOPHERECTOMY: SHX6086

## 2014-09-11 LAB — GLUCOSE, CAPILLARY
Glucose-Capillary: 137 mg/dL — ABNORMAL HIGH (ref 70–99)
Glucose-Capillary: 198 mg/dL — ABNORMAL HIGH (ref 70–99)

## 2014-09-11 LAB — TYPE AND SCREEN
ABO/RH(D): A POS
Antibody Screen: NEGATIVE

## 2014-09-11 LAB — ABO/RH: ABO/RH(D): A POS

## 2014-09-11 SURGERY — ROBOTIC ASSISTED TOTAL HYSTERECTOMY WITH BILATERAL SALPINGO OOPHORECTOMY
Anesthesia: General | Laterality: Bilateral

## 2014-09-11 MED ORDER — CEFAZOLIN SODIUM-DEXTROSE 2-3 GM-% IV SOLR
2.0000 g | INTRAVENOUS | Status: AC
Start: 2014-09-11 — End: 2014-09-11
  Administered 2014-09-11: 2 g via INTRAVENOUS

## 2014-09-11 MED ORDER — HYDROMORPHONE HCL 1 MG/ML IJ SOLN
INTRAMUSCULAR | Status: DC | PRN
  Administered 2014-09-11 (×2): 1 mg via INTRAVENOUS

## 2014-09-11 MED ORDER — INFLUENZA VAC SPLIT QUAD 0.5 ML IM SUSY
0.5000 mL | PREFILLED_SYRINGE | INTRAMUSCULAR | Status: AC
  Administered 2014-09-12: 0.5 mL via INTRAMUSCULAR
  Filled 2014-09-11 (×2): qty 0.5

## 2014-09-11 MED ORDER — HYDROMORPHONE HCL 1 MG/ML IJ SOLN
0.2000 mg | INTRAMUSCULAR | Status: DC | PRN
  Administered 2014-09-11 – 2014-09-12 (×2): 0.6 mg via INTRAVENOUS
  Filled 2014-09-11 (×2): qty 1

## 2014-09-11 MED ORDER — MEPERIDINE HCL 50 MG/ML IJ SOLN: 6.2500 mg | INTRAMUSCULAR | Status: DC | PRN

## 2014-09-11 MED ORDER — MIDAZOLAM HCL 5 MG/5ML IJ SOLN
INTRAMUSCULAR | Status: DC | PRN
  Administered 2014-09-11: 2 mg via INTRAVENOUS

## 2014-09-11 MED ORDER — PROPOFOL 10 MG/ML IV BOLUS
INTRAVENOUS | Status: AC
  Filled 2014-09-11: qty 20

## 2014-09-11 MED ORDER — GLYCOPYRROLATE 0.2 MG/ML IJ SOLN
INTRAMUSCULAR | Status: DC | PRN
  Administered 2014-09-11: 0.6 mg via INTRAVENOUS

## 2014-09-11 MED ORDER — STERILE WATER FOR IRRIGATION IR SOLN
Status: DC | PRN
  Administered 2014-09-11: 3000 mL

## 2014-09-11 MED ORDER — DEXAMETHASONE SODIUM PHOSPHATE 10 MG/ML IJ SOLN
INTRAMUSCULAR | Status: DC | PRN
  Administered 2014-09-11: 10 mg via INTRAVENOUS

## 2014-09-11 MED ORDER — LIDOCAINE HCL (PF) 2 % IJ SOLN
INTRAMUSCULAR | Status: DC | PRN
  Administered 2014-09-11: 20 mg via INTRADERMAL

## 2014-09-11 MED ORDER — DIPHENHYDRAMINE HCL 50 MG/ML IJ SOLN
INTRAMUSCULAR | Status: AC
  Filled 2014-09-11: qty 1

## 2014-09-11 MED ORDER — FENTANYL CITRATE 0.05 MG/ML IJ SOLN
INTRAMUSCULAR | Status: DC | PRN
  Administered 2014-09-11: 100 ug via INTRAVENOUS
  Administered 2014-09-11: 50 ug via INTRAVENOUS

## 2014-09-11 MED ORDER — KETOROLAC TROMETHAMINE 15 MG/ML IJ SOLN: 15.0000 mg | Freq: Four times a day (QID) | INTRAMUSCULAR | Status: DC | PRN

## 2014-09-11 MED ORDER — HYDROMORPHONE HCL 1 MG/ML IJ SOLN
INTRAMUSCULAR | Status: AC
  Filled 2014-09-11: qty 1

## 2014-09-11 MED ORDER — LIDOCAINE HCL (CARDIAC) 20 MG/ML IV SOLN
INTRAVENOUS | Status: AC
  Filled 2014-09-11: qty 5

## 2014-09-11 MED ORDER — PROPOFOL 10 MG/ML IV BOLUS
INTRAVENOUS | Status: DC | PRN
  Administered 2014-09-11: 130 mg via INTRAVENOUS

## 2014-09-11 MED ORDER — PROMETHAZINE HCL 25 MG/ML IJ SOLN: 6.2500 mg | INTRAMUSCULAR | Status: DC | PRN

## 2014-09-11 MED ORDER — ONDANSETRON HCL 4 MG/2ML IJ SOLN: 4.0000 mg | Freq: Four times a day (QID) | INTRAMUSCULAR | Status: DC | PRN

## 2014-09-11 MED ORDER — HEPARIN SODIUM (PORCINE) 5000 UNIT/ML IJ SOLN
5000.0000 [IU] | INTRAMUSCULAR | Status: AC
  Administered 2014-09-11: 5000 [IU] via SUBCUTANEOUS
  Filled 2014-09-11: qty 1

## 2014-09-11 MED ORDER — HYDROMORPHONE HCL 1 MG/ML IJ SOLN
0.2500 mg | INTRAMUSCULAR | Status: DC | PRN
  Administered 2014-09-11 (×2): 0.5 mg via INTRAVENOUS

## 2014-09-11 MED ORDER — ONDANSETRON HCL 4 MG/2ML IJ SOLN
INTRAMUSCULAR | Status: AC
  Filled 2014-09-11: qty 2

## 2014-09-11 MED ORDER — MIDAZOLAM HCL 2 MG/2ML IJ SOLN
INTRAMUSCULAR | Status: AC
  Filled 2014-09-11: qty 2

## 2014-09-11 MED ORDER — ONDANSETRON HCL 4 MG PO TABS: 4.0000 mg | ORAL_TABLET | Freq: Four times a day (QID) | ORAL | Status: DC | PRN

## 2014-09-11 MED ORDER — HYDROMORPHONE HCL 2 MG/ML IJ SOLN
INTRAMUSCULAR | Status: AC
  Filled 2014-09-11: qty 1

## 2014-09-11 MED ORDER — DIPHENHYDRAMINE HCL 50 MG/ML IJ SOLN
INTRAMUSCULAR | Status: DC | PRN
  Administered 2014-09-11: 12.5 mg via INTRAVENOUS

## 2014-09-11 MED ORDER — FENTANYL CITRATE 0.05 MG/ML IJ SOLN
INTRAMUSCULAR | Status: AC
  Filled 2014-09-11: qty 5

## 2014-09-11 MED ORDER — EPHEDRINE SULFATE 50 MG/ML IJ SOLN: INTRAMUSCULAR | Status: DC | PRN

## 2014-09-11 MED ORDER — LACTATED RINGERS IV SOLN
INTRAVENOUS | Status: DC | PRN
  Administered 2014-09-11 (×2): via INTRAVENOUS

## 2014-09-11 MED ORDER — ONDANSETRON HCL 4 MG/2ML IJ SOLN
INTRAMUSCULAR | Status: DC | PRN
  Administered 2014-09-11: 4 mg via INTRAVENOUS

## 2014-09-11 MED ORDER — ENOXAPARIN SODIUM 40 MG/0.4ML ~~LOC~~ SOLN
40.0000 mg | SUBCUTANEOUS | Status: DC
  Administered 2014-09-12: 40 mg via SUBCUTANEOUS
  Filled 2014-09-11: qty 0.4

## 2014-09-11 MED ORDER — LACTATED RINGERS IV SOLN: INTRAVENOUS | Status: DC

## 2014-09-11 MED ORDER — GLYCOPYRROLATE 0.2 MG/ML IJ SOLN
INTRAMUSCULAR | Status: AC
  Filled 2014-09-11: qty 3

## 2014-09-11 MED ORDER — KCL IN DEXTROSE-NACL 20-5-0.45 MEQ/L-%-% IV SOLN
INTRAVENOUS | Status: DC
  Administered 2014-09-11: 50 mL/h via INTRAVENOUS
  Filled 2014-09-11 (×2): qty 1000

## 2014-09-11 MED ORDER — ROCURONIUM BROMIDE 100 MG/10ML IV SOLN
INTRAVENOUS | Status: DC | PRN
  Administered 2014-09-11: 30 mg via INTRAVENOUS
  Administered 2014-09-11 (×3): 10 mg via INTRAVENOUS

## 2014-09-11 MED ORDER — ROCURONIUM BROMIDE 100 MG/10ML IV SOLN
INTRAVENOUS | Status: AC
  Filled 2014-09-11: qty 1

## 2014-09-11 MED ORDER — OXYCODONE-ACETAMINOPHEN 5-325 MG PO TABS
1.0000 | ORAL_TABLET | ORAL | Status: DC | PRN
  Administered 2014-09-11 (×2): 1 via ORAL
  Administered 2014-09-12 (×2): 2 via ORAL
  Filled 2014-09-11 (×3): qty 2

## 2014-09-11 MED ORDER — BUPIVACAINE HCL (PF) 0.25 % IJ SOLN
INTRAMUSCULAR | Status: AC
  Filled 2014-09-11: qty 30

## 2014-09-11 MED ORDER — CEFAZOLIN SODIUM-DEXTROSE 2-3 GM-% IV SOLR
INTRAVENOUS | Status: AC
  Filled 2014-09-11: qty 50

## 2014-09-11 MED ORDER — NEOSTIGMINE METHYLSULFATE 10 MG/10ML IV SOLN
INTRAVENOUS | Status: DC | PRN
  Administered 2014-09-11: 3.5 mg via INTRAVENOUS

## 2014-09-11 MED ORDER — SUCCINYLCHOLINE CHLORIDE 20 MG/ML IJ SOLN
INTRAMUSCULAR | Status: DC | PRN
  Administered 2014-09-11: 100 mg via INTRAVENOUS

## 2014-09-11 MED ORDER — MIDAZOLAM HCL 2 MG/2ML IJ SOLN: 0.5000 mg | Freq: Once | INTRAMUSCULAR | Status: DC | PRN

## 2014-09-11 SURGICAL SUPPLY — 56 items
BENZOIN TINCTURE PRP APPL 2/3 (GAUZE/BANDAGES/DRESSINGS) ×2 IMPLANT
CABLE HIGH FREQUENCY MONO STRZ (ELECTRODE) ×2 IMPLANT
CHLORAPREP W/TINT 26ML (MISCELLANEOUS) ×2 IMPLANT
CORDS BIPOLAR (ELECTRODE) ×2 IMPLANT
COVER SURGICAL LIGHT HANDLE (MISCELLANEOUS) ×2 IMPLANT
COVER TIP SHEARS 8 DVNC (MISCELLANEOUS) ×1 IMPLANT
COVER TIP SHEARS 8MM DA VINCI (MISCELLANEOUS) ×1
DERMABOND ADVANCED (GAUZE/BANDAGES/DRESSINGS) ×1
DERMABOND ADVANCED .7 DNX12 (GAUZE/BANDAGES/DRESSINGS) ×1 IMPLANT
DRAPE SHEET LG 3/4 BI-LAMINATE (DRAPES) ×4 IMPLANT
DRAPE SURG IRRIG POUCH 19X23 (DRAPES) ×2 IMPLANT
DRAPE TABLE BACK 44X90 PK DISP (DRAPES) ×4 IMPLANT
DRAPE UTILITY XL STRL (DRAPES) ×2 IMPLANT
DRAPE WARM FLUID 44X44 (DRAPE) ×2 IMPLANT
DRSG TEGADERM 2-3/8X2-3/4 SM (GAUZE/BANDAGES/DRESSINGS) ×6 IMPLANT
DRSG TEGADERM 4X4.75 (GAUZE/BANDAGES/DRESSINGS) ×4 IMPLANT
DRSG TEGADERM 6X8 (GAUZE/BANDAGES/DRESSINGS) ×4 IMPLANT
ELECT REM PT RETURN 9FT ADLT (ELECTROSURGICAL) ×2
ELECTRODE REM PT RTRN 9FT ADLT (ELECTROSURGICAL) ×1 IMPLANT
GAUZE SPONGE 2X2 8PLY STRL LF (GAUZE/BANDAGES/DRESSINGS) ×2 IMPLANT
GLOVE BIO SURGEON STRL SZ 6.5 (GLOVE) ×8 IMPLANT
GLOVE BIO SURGEON STRL SZ7.5 (GLOVE) ×4 IMPLANT
GLOVE INDICATOR 8.0 STRL GRN (GLOVE) ×4 IMPLANT
GOWN STRL NON-REIN LRG LVL3 (GOWN DISPOSABLE) ×2 IMPLANT
GOWN STRL REUS W/TWL XL LVL3 (GOWN DISPOSABLE) ×4 IMPLANT
HOLDER FOLEY CATH W/STRAP (MISCELLANEOUS) ×2 IMPLANT
KIT ACCESSORY DA VINCI DISP (KITS) ×1
KIT ACCESSORY DVNC DISP (KITS) ×1 IMPLANT
KIT BASIN OR (CUSTOM PROCEDURE TRAY) ×2 IMPLANT
MANIPULATOR UTERINE 4.5 ZUMI (MISCELLANEOUS) ×2 IMPLANT
OCCLUDER COLPOPNEUMO (BALLOONS) ×2 IMPLANT
POUCH SPECIMEN RETRIEVAL 10MM (ENDOMECHANICALS) ×4 IMPLANT
SCISSORS LAP 5X35 DISP (ENDOMECHANICALS) ×2 IMPLANT
SET TUBE IRRIG SUCTION NO TIP (IRRIGATION / IRRIGATOR) ×2 IMPLANT
SHEET LAVH (DRAPES) ×2 IMPLANT
SOLUTION ELECTROLUBE (MISCELLANEOUS) ×2 IMPLANT
SPONGE GAUZE 2X2 STER 10/PKG (GAUZE/BANDAGES/DRESSINGS) ×2
SPONGE LAP 18X18 X RAY DECT (DISPOSABLE) IMPLANT
STRIP CLOSURE SKIN 1/2X4 (GAUZE/BANDAGES/DRESSINGS) ×2 IMPLANT
SUT VIC AB 0 CT1 27 (SUTURE) ×2
SUT VIC AB 0 CT1 27XBRD ANTBC (SUTURE) ×2 IMPLANT
SUT VIC AB 4-0 PS2 27 (SUTURE) ×4 IMPLANT
SUT VICRYL 0 UR6 27IN ABS (SUTURE) ×2 IMPLANT
SYR 50ML LL SCALE MARK (SYRINGE) ×2 IMPLANT
SYR BULB IRRIGATION 50ML (SYRINGE) IMPLANT
TOWEL OR 17X26 10 PK STRL BLUE (TOWEL DISPOSABLE) ×4 IMPLANT
TOWEL OR NON WOVEN STRL DISP B (DISPOSABLE) ×2 IMPLANT
TRAP SPECIMEN MUCOUS 40CC (MISCELLANEOUS) IMPLANT
TRAY FOLEY CATH 14FRSI W/METER (CATHETERS) ×2 IMPLANT
TRAY LAPAROSCOPIC (CUSTOM PROCEDURE TRAY) ×2 IMPLANT
TROCAR 12M 150ML BLUNT (TROCAR) ×2 IMPLANT
TROCAR BLADELESS OPT 5 75 (ENDOMECHANICALS) ×2 IMPLANT
TROCAR XCEL 12X100 BLDLESS (ENDOMECHANICALS) ×2 IMPLANT
TUBING INSUFFLATION 10FT LAP (TUBING) ×2 IMPLANT
VCARE LG CERV RETRACTOR ×2 IMPLANT
WATER STERILE IRR 1500ML POUR (IV SOLUTION) ×4 IMPLANT

## 2014-09-11 NOTE — Anesthesia Procedure Notes (Signed)
Procedure Name: Intubation Date/Time: 09/11/2014 7:40 AM Performed by: Lajuana Carry E Pre-anesthesia Checklist: Patient identified, Emergency Drugs available, Suction available and Patient being monitored Patient Re-evaluated:Patient Re-evaluated prior to inductionOxygen Delivery Method: Circle System Utilized Preoxygenation: Pre-oxygenation with 100% oxygen Intubation Type: IV induction Ventilation: Mask ventilation without difficulty Laryngoscope Size: Miller and 2 (First attempt by paramedic student w/ Mac 4) Grade View: Grade II Tube type: Oral Tube size: 7.0 mm Number of attempts: 2 (Attempt by student M. Foss w/o success, post pharynx  w/some blood noted. No difficulty by CRNA w/Miller 2) Airway Equipment and Method: Stylet Placement Confirmation: ETT inserted through vocal cords under direct vision,  positive ETCO2 and breath sounds checked- equal and bilateral Tube secured with: Tape Dental Injury: Teeth and Oropharynx as per pre-operative assessment

## 2014-09-11 NOTE — H&P (View-Only) (Signed)
Consult Note: Gyn-Onc  Consult was requested by Dr. Dove for the evaluation of Laura Douglas 54 y.o. 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 endometrial cancer    Assessment/Plan:  Laura Douglas  is a 54 y.o.  year old [id number] with clinical stage I endometrial adenocarcinoma and possible personal history of colon cancer (s/p colonic resection in 2009).   A detailed discussion was held with the patient with an interpreter present to translate. Discussion was made with regard to to her endometrial cancer diagnosis. We discussed the standard management options for uterine cancer which includes surgery followed possibly by adjuvant therapy depending on the results of surgery. The options for surgical management include a hysterectomy and removal of the tubes and ovaries possibly with removal of pelvic and para-aortic lymph nodes. A minimally invasive approach including a robotic hysterectomy or laparoscopic hysterectomy have benefits including shorter hospital stay, recovery time and better wound healing. The alternative approach is an open hysterectomy. The patient has been counseled about these surgical options and the risks of surgery in general including infection, bleeding, damage to surrounding structures (including bowel, bladder, ureters, nerves or vessels), and the postoperative risks of PE/ DVT, and lymphedema. I extensively reviewed the additional risks of robotic hysterectomy including possible need for conversion to open laparotomy.  I discussed positioning during surgery of trendelenberg and risks of minor facial swelling and care we take in preoperative positioning.  I discussed that she is at an increased risk for conversion to laparotomy secondary to adhesive disease due to her prior laparotomy and colon resection. After counseling and consideration of her options, she desires  to proceed with robotic hysterectomy, BSO, lymph node dissection.   She will be seen by anesthesia for preoperative clearance and discussion of postoperative pain management.  She was given the opportunity to ask questions, which were answered to her satisfaction, and she is agreement with the above mentioned plan of care.  Given her young age at diagnosis and possible prior history of colon cancer I have concerns that she may carry deletion in a microsatellite instability gene and recommends workup for Lynch syndrome postoperatively.   HPI: Laura Douglas is a 54-year-old woman who is seen in consultation at the request of Dr Dove for endometrial cancer. She has a history of 2 weeks of postmenopausal bleeding in January 2016. She been menopausal for about 3 years prior to that time. She sought evaluation with OB/GYN and Dr. Dove who performed a transvaginal ultrasound scan which revealed a 7.4 x 2.7 x 4 cm uterus with a 10 mm endometrial stripe. The ovaries were grossly normal bilaterally. To follow up the thickened endometrial stripe Dr. Dove performed an endometrial [place] biopsy on 08/01/2014 which revealed well-differentiated endometrial adenocarcinoma associated with complex atypical hyperplasia.  The patient has no primary care doctor. She is an immigrant from [place]. She has hypertension but has not been treated for this.  She reports a history of resection of a colon tumor 7 years ago in [place]. She denies HEENT told that she had cancer. She denies any other family history significant for malignancy other than a paternal aunt with cervical cancer.  Current Meds:  Outpatient Encounter Prescriptions as of 08/13/2014  Medication Sig  . ibuprofen (ADVIL,MOTRIN) 600 MG tablet Take 1 tablet (600 mg total) by mouth every 6 (six) hours as needed.    Allergy: No Known Allergies  Social Hx:     History   Social History  . Marital Status: Single    Spouse Name: N/A  . Number of Children: N/A  .  Years of Education: N/A   Occupational History  . Not on file.   Social History Main Topics  . Smoking status: Never Smoker   . Smokeless tobacco: Not on file  . Alcohol Use: Yes     Comment: occasionally  . Drug Use: No  . Sexual Activity: Yes    Birth Control/ Protection: Surgical   Other Topics Concern  . Not on file   Social History Narrative    Past Surgical Hx:  Past Surgical History  Procedure Laterality Date  . Fracture surgery  2006    Lt lower arm  . Colon surgery  2010    tumor removal  . Tubal ligation  1993  . Multiple tooth extractions      Past Medical Hx:  Past Medical History  Diagnosis Date  . Hypertension   . Obesity   . Colon cancer     possible? Patient had surgery in [place]. Denies needing adjuvant therapy. The surgery "removed a tumor from the colon".    Past Gynecological History:  SVD x 4  No LMP recorded. Patient is postmenopausal.  Family Hx:  Family History  Problem Relation Age of Onset  . Cancer Father     prostate  . Diabetes Mother     Review of Systems:  Constitutional  Feels well,    ENT Normal appearing ears and nares bilaterally Skin/Breast  No rash, sores, jaundice, itching, dryness Cardiovascular  No chest pain, shortness of breath, or edema  Pulmonary  No cough or wheeze.  Gastro Intestinal  No nausea, vomitting, or diarrhoea. No bright red blood per rectum, no abdominal pain, change in bowel movement, or constipation.  Genito Urinary  No frequency, urgency, dysuria,  Musculo Skeletal  No myalgia, arthralgia, joint swelling or pain  Neurologic  No weakness, numbness, change in gait,  Psychology  No depression, anxiety, insomnia.   Vitals:  Blood pressure 159/85, pulse 97, temperature 98.5 F (36.9 C), temperature source Oral, resp. rate 20, height 5' (1.524 m), weight 160 lb 4.8 oz (72.712 kg).  Physical Exam: WD in NAD Neck  Supple NROM, without any enlargements.  Lymph Node Survey No cervical  supraclavicular or inguinal adenopathy Cardiovascular  Pulse normal rate, regularity and rhythm. S1 and S2 normal.  Lungs  Clear to auscultation bilateraly, without wheezes/crackles/rhonchi. Good air movement.  Skin  No rash/lesions/breakdown  Psychiatry  Alert and oriented to person, place, and time  Abdomen  Vertical midline incision well healed. Normoactive bowel sounds, abdomen soft, non-tender and obese without evidence of hernia.  Back No CVA tenderness Genito Urinary  Vulva/vagina: Normal external female genitalia.  No lesions. No discharge or bleeding.  Bladder/urethra:  No lesions or masses, well supported bladder  Vagina: normal  Cervix: Normal appearing, no lesions.  Uterus: Small, mobile, no parametrial involvement or nodularity.  Adnexa: no palpable masses. Rectal  Good tone, no masses no cul de sac nodularity.  Extremities  No bilateral cyanosis, clubbing or edema.   Pakou Rainbow Caroline, MD   08/13/2014, 1:56 PM      

## 2014-09-11 NOTE — Transfer of Care (Signed)
Immediate Anesthesia Transfer of Care Note  Patient: Laura Douglas  Procedure(s) Performed: Procedure(s): ROBOTIC ASSISTED TOTAL LAPAROSCOPIC HYSTERECTOMY WITH BILATERAL SALPINGO OOPHORECTOMY LYMPH NODE DISSECTION; LYSIS OF ADHESIONS (Bilateral)  Patient Location: PACU  Anesthesia Type:General  Level of Consciousness: awake, sedated and responds to stimulation  Airway & Oxygen Therapy: Patient Spontanous Breathing and Patient connected to face mask oxygen  Post-op Assessment: Report given to RN and Post -op Vital signs reviewed and stable  Post vital signs: Reviewed and stable  Last Vitals:  Filed Vitals:   09/11/14 1050  BP:   Pulse: 126  Temp:   Resp:     Complications: No apparent anesthesia complications

## 2014-09-11 NOTE — Anesthesia Postprocedure Evaluation (Signed)
  Anesthesia Post-op Note  Patient: Laura Douglas  Procedure(s) Performed: Procedure(s): ROBOTIC ASSISTED TOTAL LAPAROSCOPIC HYSTERECTOMY WITH BILATERAL SALPINGO OOPHORECTOMY LYMPH NODE DISSECTION; LYSIS OF ADHESIONS (Bilateral)  Patient Location: PACU  Anesthesia Type:General  Level of Consciousness: awake, alert , oriented and patient cooperative  Airway and Oxygen Therapy: Patient Spontanous Breathing and Patient connected to nasal cannula oxygen  Post-op Pain: mild  Post-op Assessment: Post-op Vital signs reviewed, Patient's Cardiovascular Status Stable, Respiratory Function Stable, Patent Airway, No signs of Nausea or vomiting and Pain level controlled  Post-op Vital Signs: Reviewed and stable  Last Vitals:  Filed Vitals:   09/11/14 1454  BP: 120/71  Pulse: 114  Temp: 36.8 C  Resp: 18    Complications: No apparent anesthesia complications

## 2014-09-11 NOTE — Anesthesia Preprocedure Evaluation (Addendum)
Anesthesia Evaluation  Patient identified by MRN, date of birth, ID band Patient awake    Reviewed: Allergy & Precautions, NPO status , Patient's Chart, lab work & pertinent test results  History of Anesthesia Complications Negative for: history of anesthetic complications  Airway Mallampati: II  TM Distance: >3 FB Neck ROM: Full    Dental  (+) Poor Dentition, Missing, Chipped, Dental Advisory Given   Pulmonary neg pulmonary ROS,  breath sounds clear to auscultation        Cardiovascular hypertension (never on meds), - anginaRhythm:Regular Rate:Normal     Neuro/Psych negative neurological ROS     GI/Hepatic negative GI ROS, Elevated LFTs   Endo/Other  diabetes (glu 137), Oral Hypoglycemic AgentsMorbid obesity  Renal/GU negative Renal ROS     Musculoskeletal   Abdominal (+) + obese,   Peds  Hematology   Anesthesia Other Findings   Reproductive/Obstetrics S/p BTL Endometrial cancer                            Anesthesia Physical Anesthesia Plan  ASA: II  Anesthesia Plan: General   Post-op Pain Management:    Induction: Intravenous  Airway Management Planned: Oral ETT  Additional Equipment:   Intra-op Plan:   Post-operative Plan: Extubation in OR  Informed Consent: I have reviewed the patients History and Physical, chart, labs and discussed the procedure including the risks, benefits and alternatives for the proposed anesthesia with the patient or authorized representative who has indicated his/her understanding and acceptance.   Dental advisory given  Plan Discussed with: CRNA and Surgeon  Anesthesia Plan Comments: (Plan routine monitors, GETA)        Anesthesia Quick Evaluation

## 2014-09-11 NOTE — Op Note (Signed)
Preoperative Diagnosis: Grade  1 endometrial cancer  Postoperative Diagnosis: Grade 1 endometrial cancer  Procedure(s) Performed: Robotic total laparoscopic hysterectomy, Bilateral salpingo oophorectomy,  Bilateral pelvic lymph node dissection, extensive adhesiolysis  Anesthesia: GET  Surgeon: Francetta Found.  Skeet Latch, M.D. PhD  Assistant Surgeon: Lahoma Crocker MD.   Specimens: Uterus cervix, bilateral ovaries and tubes, bilateral pelvic LN,   Estimated Blood Loss: 46mL.   Complications: none  Indication for Procedure: This is a 54 y.o. old who underwent prior endometrial cancer  Demonstrating  grade 1 endometrial cancer.  Operative Findings:  7 cm uterus. normal adnexa. No masses. Small bowel adherent to the anterior abdominal wall, absent right colon  Frozen pathology was consistent with grade 1 endometrial cancer with minimal invasion  Procedure: Patient was taken to the operating room and placed under general endotracheal anesthesia without any difficulty. She is placed in the dorsal lithotomy position and secured to the operative table over the chest with tape.   The patient was prepped and draped and the uterine manipulator placed within the endometrial cavity. The balloon was placed within the vagina. An OG tube was present and functional. At an area on the left in line with the nipple approximately 2 cm below the ribs the area was infiltrated with 1% lidocaine and a 5 mm Optiview inserted under direct visualization. The abdomen was insufflated to 15 mm of mercury and the pressure never deviated above that throughout the remainder of the procedure. Maximum Trendelenburg positioning was obtained. At approximately 22 cm proximal to the symphysis pubis an incision was made just superior to the umbilicus. Additional incisions were made 10 cm lateral to the umbilical incision and 2 cm superior to the left anterior superior iliac spine. 10 mm trocar was inserted in the superior umbilicus  incision. Millimeter robotic ports were placed in the other 3 incisions. The left upper quadrant port site was replaced with a 10 mm port. This was all completed under direct visualization. The small and large bowel were reflected as much as possible into the upper abdomen. The small bowel was dissected using sharp dissection from the anterior abdominal wall.  The robot was docked and instruments placed.  The right round ligament was transected and the ureter was identified. The right infundibulopelvic ligament was cauterized and transected The retroperitoneal space was entered on the right and the peritoneum incised to the level of the vesicouterine ligament anteriorly. The bladder flap was created using cautery. The peritoneal dissection was continued inferiorly and across the inferior most aspect of the cervix. In this manner the urethra was deflected inferiorly. The bladder flap was further developed. The uterine vessels on the right were skeletonized ligated and transected.  The left ureter was identified. The left gonadal vessels were cauterized and transected. The broad ligament was skeletonized posteriorly to the level of the cervix and the peritoneum dissected free from the cervix and in this fashion the ureter was deflected inferiorly. The anterior peritoneum was further dissected and the bladder flap appropriately developed. The uterine vessels were skeletonized cauterized and transected. The balloon and the vagina was then maximally insufflated. A colpotomy incision was made circumferentially and the uterus cervix ovaries and tubes were delivered from the vagina. The balloon was replaced.  Right pelvic lymph node dissection was then initiated. The superior vesicle artery was identified and the vesicouterine space developed. The obturator nerve was identified. Nodal tissue was removed within the boundaries of the right genitofemoral nerve the right circumflex vein, the ureter and the superior  vesicle artery.  The nodal tissue was placed in an Endo Catch bag. The left pelvic lymph node dissection was then initiated. The superior vesicle artery was identified and the vesicouterine space developed. The obturator nerve was identified. Nodal tissue was removed within the boundaries of the left  genitofemoral nerve the left circumflex vein, the ureter and the superior vesicle artery. The nodal tissue was placed in an Endo Catch bag.   At that time the frozen section evaluation returned and it was significant for three areas of tumor within the endometrium with less than 50% myometrial invasion, grade 1.   The specimens were removed through the vagina. The vaginal balloon was reinserted.  The pelvis was copiously irrigated and drained and hemostasis was assured. The vaginal cuff was closed with a running 0 Vicryl suture ligature. The needle was removed under direct visualization. The operative site is once again visualized and hemostasis was assured. The instruments were removed from the abdomen and pelvis and the port sites irrigated. The umbilical fascia was closed with an interrupted 0 Vicryl  suture. The subcutaneous tissue of the umbilical left upper quadrant and right lower quadrant subcutaneous tissues were approximated with a single suture. Skin incisions were closed with a subcuticular suture.  The vaginal vault was cleared with a moist sponge stick.  Sponge, lap and needle counts were correct x 3.    The patient had sequential compression devices and preoperative Lovenox for VTE prophylaxis and will receive Lovenox postoperatively.          Disposition: PACU - hemodynamically stable.  EBL 25cc  UO 100cc  Flusids 1500cc crystalloid         Condition:stable Foley draining clear urine.

## 2014-09-11 NOTE — Interval H&P Note (Signed)
History and Physical Interval Note:  09/11/2014 7:23 AM  Laura Douglas  has presented today for surgery, with the diagnosis of ENDOMETRIAL CANCER  The various methods of treatment have been discussed with the patient and family. After consideration of risks, benefits and other options for treatment, the patient has consented to  Procedure(s): ROBOTIC ASSISTED TOTAL LAPAROSCOPIC HYSTERECTOMY WITH BILATERAL SALPINGO OOPHORECTOMY/POSSIBLE LYMPH NODE DISSECTION (Bilateral) as a surgical intervention .  The patient's history has been reviewed, patient examined, no change in status, stable for surgery.  I have reviewed the patient's chart and labs.  Questions were answered to the patient's satisfaction.     [place], [facility]

## 2014-09-12 LAB — BASIC METABOLIC PANEL
ANION GAP: 9 (ref 5–15)
BUN: 10 mg/dL (ref 6–23)
CO2: 24 mmol/L (ref 19–32)
Calcium: 9.1 mg/dL (ref 8.4–10.5)
Chloride: 101 mmol/L (ref 96–112)
Creatinine, Ser: 0.78 mg/dL (ref 0.50–1.10)
GFR calc Af Amer: >90 mL/min (ref >90)
Glucose, Bld: 180 mg/dL — ABNORMAL HIGH (ref 70–99)
Potassium: 4.3 mmol/L (ref 3.5–5.1)
Sodium: 134 mmol/L — ABNORMAL LOW (ref 135–145)

## 2014-09-12 LAB — CBC
HCT: 37.9 % (ref 36.0–46.0)
Hemoglobin: 12.5 g/dL (ref 12.0–15.0)
MCH: 29.6 pg (ref 26.0–34.0)
MCHC: 33 g/dL (ref 30.0–36.0)
MCV: 89.6 fL (ref 78.0–100.0)
Platelets: 233 10*3/uL (ref 150–400)
RBC: 4.23 MIL/uL (ref 3.87–5.11)
RDW: 12.7 % (ref 11.5–15.5)
WBC: 10.6 10*3/uL — ABNORMAL HIGH (ref 4.0–10.5)

## 2014-09-12 MED ORDER — OXYCODONE-ACETAMINOPHEN 5-325 MG PO TABS: 1.0000 | ORAL_TABLET | ORAL | Status: DC | PRN

## 2014-09-12 MED ORDER — METFORMIN HCL 500 MG PO TABS
500.0000 mg | ORAL_TABLET | Freq: Every day | ORAL | Status: DC
  Administered 2014-09-12: 500 mg via ORAL
  Filled 2014-09-12 (×2): qty 1

## 2014-09-12 NOTE — Progress Notes (Signed)
Pt discharged home with family. Pt voided prior to discharge. Discharge teaching reviewed with pt and family by nurse using interpreter line. Pt verbalized that she understands and had no further questions or concerns. IV dc'd.

## 2014-09-12 NOTE — Discharge Instructions (Addendum)
09/12/2014  Return to work: 4-6 weeks if applicable  Activity: 1. Be up and out of the bed during the day.  Take a nap if needed.  You may walk up steps but be careful and use the hand rail.  Stair climbing will tire you more than you think, you may need to stop part way and rest.   2. No lifting or straining for 6 weeks.  3. No driving for 1 week(s).  Do not drive if you are taking narcotic pain medicine.  4. Shower daily.  Use soap and water on your incision and pat dry; don't rub.  No tub baths until cleared by your surgeon.   5. No sexual activity and nothing in the vagina for 8 weeks.  Diet: 1. Low sodium Heart Healthy Diet is recommended.  2. It is safe to use a laxative, such as Miralax or Colace, if you have difficulty moving your bowels.   Wound Care: 1. Keep clean and dry.  Shower daily.  Reasons to call the Doctor:  Fever - Oral temperature greater than 100.4 degrees Fahrenheit  Foul-smelling vaginal discharge  Difficulty urinating  Nausea and vomiting  Increased pain at the site of the incision that is unrelieved with pain medicine.  Difficulty breathing with or without chest pain  New calf pain especially if only on one side  Sudden, continuing increased vaginal bleeding with or without clots.   Contacts: For questions or concerns you should contact:  Dr. Skeet Latch at [phone] or [facility] [phone]  Dr. Everitt Amber at [phone]  Joylene John, NP at [phone]  After Hours: call [phone] and ask for the GYN Oncologist on call  Histerectoma abdominal, [place] (Abdominal Hysterectomy, Care After) Estas indicaciones le proporcionan informacin general acerca de cmo deber cuidarse despus del procedimiento. El mdico tambin podr darle instrucciones especficas. Comunquese con el mdico si tiene algn problema o tiene preguntas despus del procedimiento.  CUIDADOS EN EL HOGAR La recuperacin de esta ciruga demora de 4a  6semanas. [place] los medicamentos solamente como se lo haya indicado el mdico.  Cambie el vendaje como se lo haya indicado el mdico.  Debe ver nuevamente al mdico para que le saque los puntos.  Tome duchas durante 2a 3semanas. Pregntele al mdico cundo puede tomar duchas.  No se haga duchas vaginales, no use tampones ni tenga relaciones sexuales durante al menos 6semanas o segn las indicaciones.  Siga las indicaciones del mdico con respecto a la actividad fsica, a Surveyor, quantity, a conducir el automvil y a las actividades en general.  Descanse y duerma lo suficiente.  No levante nada que sea ms pesado que un galn de Air traffic controller (alrededor de 10libras [4.5kilogramos]) durante el primer mes posterior a la Leisure centre manager.  Retome su dieta normal segn lo indicado por el mdico.  No beba alcohol hasta que el mdico se lo autorice.  Tome un medicamento como ayuda para Management consultant (laxante) segn las indicaciones del mdico.  Los alimentos con alto contenido de fibra pueden tener un efecto laxante. Coma muchas frutas y vegetales crudos, cereales integrales y frijoles.  Beba suficiente lquido para mantener el pis (orina) claro o de color amarillo plido.  Pdale a alguna persona que la ayude con las tareas del hogar durante 1 a 2 semanas despus de la [place].  [place]pl[place] con los controles mdicos segn las indicaciones. SOLICITE AYUDA SI:  Siente escalofros o tiene fiebre.  Tiene inflamacin, enrojecimiento o dolor en el rea del  corte (incisin).  Observa un lquido blanco amarillento (pus) que supura del corte.  Advierte un olor ftido que proviene de la herida o del vendaje.  La herida se abre.  Se siente mareada o tiene vahdos.  Siente dolor o tiene una hemorragia al [employer].  Tiene evacuaciones lquidas (diarrea) que no se interrumpen.  Tiene malestar estomacal (nuseas) o vmitos que no se interrumpen.  Le sale un lquido  (secrecin) de la vagina.  Tiene una erupcin cutnea.  Tiene algn problema o sufre alguna reaccin a los medicamentos.  Necesita analgsicos ms fuertes. SOLICITE AYUDA DE INMEDIATO SI:   Tiene fiebre y los sntomas empeoran repentinamente.  Tiene mucho dolor de estmago (abdominal).  Siente dolor en el pecho.  Comienza a sentir falta de [place].  Pierde el conocimiento (se desmaya).  Siente dolor, u observa hinchazn o enrojecimiento en la pierna.  Sangra mucho por la vagina y observa grumos de tejido (cogulos). ASEGRESE DE QUE:   Comprende estas instrucciones.  Controlar su afeccin.  Recibir ayuda de inmediato si no mejora o si empeora. Document Released: 05/21/2011 Document Revised: 06/06/2013 [facility] Patient Information 2015 [place]. This information is not intended to replace advice given to you by your health care provider. Make sure you discuss any questions you have with your health care provider.

## 2014-09-12 NOTE — Discharge Summary (Signed)
Physician Discharge Summary  Patient ID: RAVAN SCHLEMMER MRN: [id number] DOB/AGE: February 09, 1961 54 y.o.  Admit date: 09/11/2014 Discharge date: 09/12/2014  Admission Diagnoses: Endometrial cancer  Discharge Diagnoses:  Principal Problem:   Endometrial cancer   Discharged Condition:  The patient is in good condition and stable for discharge.    Hospital Course: On 09/11/2014, the patient underwent the following: Procedure(s): ROBOTIC ASSISTED TOTAL LAPAROSCOPIC HYSTERECTOMY WITH BILATERAL SALPINGO OOPHORECTOMY LYMPH NODE DISSECTION; LYSIS OF ADHESIONS.  The postoperative course was uneventful.  She was discharged to home on postoperative day 1 tolerating a regular diet, voiding without difficulty, with minimal pain.  Consults: None  Significant Diagnostic Studies: None  Treatments: surgery: see above  Discharge Exam: Blood pressure 106/64, pulse 94, temperature 98.7 F (37.1 C), temperature source Oral, resp. rate 18, height 5' (1.524 m), weight 164 lb (74.39 kg), SpO2 97 %. General appearance: alert, cooperative and no distress Resp: clear to auscultation bilaterally Cardio: regular rate and rhythm, S1, S2 normal, no murmur, click, rub or gallop GI: soft, non-tender; bowel sounds normal; no masses,  no organomegaly and abdomen obese Extremities: extremities normal, atraumatic, no cyanosis or edema Incision/Wound: Lap sites with dermabond without erythema or drainage  Disposition: 01-Home or Self Care      Discharge Instructions    Call MD for:  difficulty breathing, headache or visual disturbances    Complete by:  As directed      Call MD for:  extreme fatigue    Complete by:  As directed      Call MD for:  hives    Complete by:  As directed      Call MD for:  persistant dizziness or light-headedness    Complete by:  As directed      Call MD for:  persistant nausea and vomiting    Complete by:  As directed      Call MD for:  redness, tenderness, or signs of  infection (pain, swelling, redness, odor or green/yellow discharge around incision site)    Complete by:  As directed      Call MD for:  severe uncontrolled pain    Complete by:  As directed      Call MD for:  temperature >100.4    Complete by:  As directed      Diet - low sodium heart healthy    Complete by:  As directed      Driving Restrictions    Complete by:  As directed   No driving for 1 week.  Do not take narcotics and drive.     Increase activity slowly    Complete by:  As directed      Lifting restrictions    Complete by:  As directed   No lifting greater than 10 lbs.     Sexual Activity Restrictions    Complete by:  As directed   No sexual activity, nothing in the vagina, for 8 weeks.            Medication List    TAKE these medications        ciprofloxacin 250 MG tablet  Commonly known as:  CIPRO  Take 1 tablet by mouth twice a day for 7 days.     metFORMIN 500 MG tablet  Commonly known as:  GLUCOPHAGE  Take 1 tablet (500 mg total) by mouth daily with breakfast.     oxyCODONE-acetaminophen 5-325 MG per tablet  Commonly known as:  PERCOCET/ROXICET  Take 1-2 tablets by  mouth every 4 (four) hours as needed for moderate pain or severe pain (moderate to severe pain).       Follow-up Information    Follow up with Janie Morning, MD On 10/04/2014.   Specialty:  Obstetrics and Gynecology   Why:  at 1:45pm at the [facility] information:   [place] [address] [phone]       Greater than thirty minutes were spend for face to face discharge instructions and discharge orders/summary in EPIC.   Signed: Jayziah Bankhead DEAL 09/12/2014, 12:36 PM

## 2014-09-13 ENCOUNTER — Encounter (HOSPITAL_COMMUNITY): Payer: Self-pay | Admitting: Gynecologic Oncology

## 2014-09-19 ENCOUNTER — Encounter (HOSPITAL_COMMUNITY): Payer: Self-pay | Admitting: Gynecologic Oncology

## 2014-09-28 ENCOUNTER — Emergency Department (HOSPITAL_COMMUNITY)
Admission: EM | Admit: 2014-09-28 | Discharge: 2014-09-28 | Disposition: A | Discharge status: Home / Self Care | Payer: Self-pay | Attending: Emergency Medicine | Admitting: Emergency Medicine

## 2014-09-28 ENCOUNTER — Encounter (HOSPITAL_COMMUNITY): Payer: Self-pay | Admitting: Emergency Medicine

## 2014-09-28 DIAGNOSIS — Z79899 Other long term (current) drug therapy: Secondary | ICD-10-CM | POA: Insufficient documentation

## 2014-09-28 DIAGNOSIS — IMO0002 Reserved for concepts with insufficient information to code with codable children: Secondary | ICD-10-CM

## 2014-09-28 DIAGNOSIS — Z85038 Personal history of other malignant neoplasm of large intestine: Secondary | ICD-10-CM | POA: Insufficient documentation

## 2014-09-28 DIAGNOSIS — I10 Essential (primary) hypertension: Secondary | ICD-10-CM | POA: Insufficient documentation

## 2014-09-28 DIAGNOSIS — E119 Type 2 diabetes mellitus without complications: Secondary | ICD-10-CM | POA: Insufficient documentation

## 2014-09-28 DIAGNOSIS — E669 Obesity, unspecified: Secondary | ICD-10-CM | POA: Insufficient documentation

## 2014-09-28 DIAGNOSIS — N99820 Postprocedural hemorrhage and hematoma of a genitourinary system organ or structure following a genitourinary system procedure: Secondary | ICD-10-CM | POA: Insufficient documentation

## 2014-09-28 HISTORY — DX: Type 2 diabetes mellitus without complications: E11.9

## 2014-09-28 LAB — CBC WITH DIFFERENTIAL/PLATELET
BASOS ABS: 0 10*3/uL (ref 0.0–0.1)
BASOS PCT: 0 % (ref 0–1)
Eosinophils Absolute: 0.2 10*3/uL (ref 0.0–0.7)
Eosinophils Relative: 2 % (ref 0–5)
HEMATOCRIT: 36.6 % (ref 36.0–46.0)
Hemoglobin: 12.4 g/dL (ref 12.0–15.0)
Lymphocytes Relative: 29 % (ref 12–46)
Lymphs Abs: 2.3 10*3/uL (ref 0.7–4.0)
MCH: 29.8 pg (ref 26.0–34.0)
MCHC: 33.9 g/dL (ref 30.0–36.0)
MCV: 88 fL (ref 78.0–100.0)
Monocytes Absolute: 0.5 10*3/uL (ref 0.1–1.0)
Monocytes Relative: 6 % (ref 3–12)
NEUTROS ABS: 5 10*3/uL (ref 1.7–7.7)
Neutrophils Relative %: 63 % (ref 43–77)
PLATELETS: 289 10*3/uL (ref 150–400)
RBC: 4.16 MIL/uL (ref 3.87–5.11)
RDW: 12.4 % (ref 11.5–15.5)
WBC: 8 10*3/uL (ref 4.0–10.5)

## 2014-09-28 LAB — URINALYSIS, ROUTINE W REFLEX MICROSCOPIC
Bilirubin Urine: NEGATIVE
GLUCOSE, UA: NEGATIVE mg/dL
Ketones, ur: NEGATIVE mg/dL
LEUKOCYTES UA: NEGATIVE
NITRITE: POSITIVE — AB
PH: 7 (ref 5.0–8.0)
Protein, ur: NEGATIVE mg/dL
Specific Gravity, Urine: 1.01 (ref 1.005–1.030)
Urobilinogen, UA: 0.2 mg/dL (ref 0.0–1.0)

## 2014-09-28 LAB — URINE MICROSCOPIC-ADD ON

## 2014-09-28 NOTE — ED Notes (Signed)
Triage completed using translator phone assisted by [employer] [phone]

## 2014-09-28 NOTE — ED Provider Notes (Signed)
CSN: [id number]     Arrival date & time 09/28/14  0001 History   First MD Initiated Contact with Patient 09/28/14 [phone]     Chief Complaint  Patient presents with  . Vaginal Bleeding    POST HYSTRECTOMY     (Consider location/radiation/quality/duration/timing/severity/associated sxs/prior Treatment) HPI 54 year old female presents to the emergency department from home with complaint of vaginal bleeding.  Patient is status post total hysterectomy with bilateral salping oophorectomy on March 29.  She has been told that brown or maroon blood was normal, but if she had bright red blood, she should come to the emergency department for evaluation.  She denies any pain.  No dysuria.  Patient reports she notices only a small  amount of bright red blood when wiping and on her underwear.  No dizziness, no weakness, no fevers or chills  Past Medical History  Diagnosis Date  . Hypertension   . Obesity   . Colon cancer     possible? Patient had surgery in [place]. Denies needing adjuvant therapy. The surgery "removed a tumor from the colon".  . Diabetes mellitus without complication    Past Surgical History  Procedure Laterality Date  . Fracture surgery  2006    Lt lower arm  . Colon surgery  2010    tumor removal  . Tubal ligation  1993  . Multiple tooth extractions    . Abortions      twice   . Robotic assisted total hysterectomy with bilateral salpingo oopherectomy Bilateral 09/11/2014    Procedure: ROBOTIC ASSISTED TOTAL LAPAROSCOPIC HYSTERECTOMY WITH BILATERAL SALPINGO OOPHORECTOMY LYMPH NODE DISSECTION; LYSIS OF ADHESIONS;  Surgeon: Janie Morning, MD;  Location: [facility];  Service: Gynecology;  Laterality: Bilateral;   Family History  Problem Relation Age of Onset  . Cancer Father     prostate  . Diabetes Mother    History  Substance Use Topics  . Smoking status: Never Smoker   . Smokeless tobacco: Never Used  . Alcohol Use: Yes     Comment: occasionally has beer   OB History     Gravida Para Term Preterm AB TAB SAB Ectopic Multiple Living   6 4 4  2  2   4      Review of Systems  See History of Present Illness; otherwise all other systems are reviewed and negative   Allergies  Review of patient's allergies indicates no known allergies.  Home Medications   Prior to Admission medications   Medication Sig Start Date End Date Taking? Authorizing Provider  guaiFENesin (ROBITUSSIN) 100 MG/5ML SOLN Take 10 mLs by mouth every 4 (four) hours as needed for cough or to loosen phlegm.   Yes Historical Provider, MD  ibuprofen (ADVIL,MOTRIN) 200 MG tablet Take 400 mg by mouth every 6 (six) hours as needed for moderate pain.   Yes Historical Provider, MD  metFORMIN (GLUCOPHAGE) 500 MG tablet Take 1 tablet (500 mg total) by mouth daily with breakfast. 09/05/14  Yes Everitt Amber, MD  oxyCODONE-acetaminophen (PERCOCET/ROXICET) 5-325 MG per tablet Take 1-2 tablets by mouth every 4 (four) hours as needed for moderate pain or severe pain (moderate to severe pain). 09/12/14  Yes Melissa D Cross, NP  ciprofloxacin (CIPRO) 250 MG tablet Take 1 tablet by mouth twice a day for 7 days. 09/07/14   Everitt Amber, MD   BP 134/73 mmHg  Pulse 101  Temp(Src) 97.6 F (36.4 C) (Oral)  Resp 20  SpO2 96% Physical Exam  Constitutional: She is oriented to person,  place, and time. She appears well-developed and well-nourished.  HENT:  Head: Normocephalic and atraumatic.  Nose: Nose normal.  Mouth/Throat: Oropharynx is clear and moist.  Eyes: Conjunctivae and EOM are normal. Pupils are equal, round, and reactive to light.  Neck: Normal range of motion. Neck supple. No JVD present. No tracheal deviation present. No thyromegaly present.  Cardiovascular: Normal rate, regular rhythm, normal heart sounds and intact distal pulses.  Exam reveals no gallop and no friction rub.   No murmur heard. Pulmonary/Chest: Effort normal and breath sounds normal. No stridor. No respiratory distress. She has no wheezes.  She has no rales. She exhibits no tenderness.  Abdominal: Soft. Bowel sounds are normal. She exhibits no distension and no mass. There is no tenderness. There is no rebound and no guarding.  Genitourinary:  External genitalia within normal limits Vagina without discharge Cervix cuff with scant amount of bright red blood along suture line without active bleeding, clots, or pooling   Musculoskeletal: Normal range of motion. She exhibits no edema or tenderness.  Lymphadenopathy:    She has no cervical adenopathy.  Neurological: She is alert and oriented to person, place, and time. She displays normal reflexes. She exhibits normal muscle tone. Coordination normal.  Skin: Skin is warm and dry. No rash noted. No erythema. No pallor.  Psychiatric: She has a normal mood and affect. Her behavior is normal. Judgment and thought content normal.  Nursing note and vitals reviewed.   ED Course  Procedures (including critical care time) Labs Review Labs Reviewed  URINALYSIS, ROUTINE W REFLEX MICROSCOPIC - Abnormal; Notable for the following:    Hgb urine dipstick LARGE (*)    Nitrite POSITIVE (*)    All other components within normal limits  URINE MICROSCOPIC-ADD ON - Abnormal; Notable for the following:    Bacteria, UA MANY (*)    All other components within normal limits  URINE CULTURE  CBC WITH DIFFERENTIAL/PLATELET    Imaging Review No results found.   EKG Interpretation None      MDM   Final diagnoses:  Postoperative vaginal bleeding   54 year old female status post hysterectomy with increased bleeding.  Case discussed with on-call OB/GYN, Dr. Annamaria Boots.  Patient is stable for follow-up with Dr. Skeet Latch on the 21st.  Linton Flemings, MD 09/28/14 [phone]

## 2014-09-28 NOTE — ED Notes (Signed)
Hysterectomy at [facility] hospital d/t CA,  March 29 th is now having increased vaginal bleeding small amount and is not using peri-pads but bleeding has changed from Brow to red and she was instructed to come to the hospital if the bleeding turns red. Denies pain

## 2014-09-28 NOTE — ED Notes (Signed)
Pt stated she cannot give a urine sample at this time.

## 2014-09-28 NOTE — Discharge Instructions (Signed)
Su evaluacin en la sala de emergencia de hoy mostr slo una muy pequea cantidad de sangrado en el lugar de la [place]. Esto esta bien. Si comienza castor sangrado, lo que requiere una almohadilla menstrual, llegar a ser dbil o mareado, por favor volver a la MAU en [facility] [place] para su evaluacin. Su anlisis de [place] fue normal. Su orina mostr algunas bacterias, pero no hay infeccin aguda. La orina fue enviada para su anlisis posterior, y usted ser contactado si necesita ms antibiticos.  Your evaluation in the ER today showed only a very small amount of bleeding from your surgical site.  This is ok.  If you begin bleeding heaver, requiring a menstrual pad, become weak or dizzy, please return to the MAU at the [facility] for evaluation.  Your blood work was normal.  Your urine showed some bacteria, but no acute infection.  The urine was sent for further testing, and you will be contacted if you need more antibiotics.

## 2014-10-01 LAB — URINE CULTURE: Colony Count: >=100000

## 2014-10-02 ENCOUNTER — Telehealth (HOSPITAL_COMMUNITY): Payer: Self-pay

## 2014-10-02 NOTE — ED Notes (Signed)
Lab results faxed to  W. Brewster at [phone]

## 2014-10-04 ENCOUNTER — Ambulatory Visit: Payer: Self-pay | Attending: Gynecologic Oncology | Admitting: Gynecologic Oncology

## 2014-10-04 ENCOUNTER — Encounter: Payer: Self-pay | Admitting: Gynecologic Oncology

## 2014-10-04 VITALS — BP 122/73 | HR 107 | Temp 98.2°F | Resp 20 | Ht 60.0 in | Wt 160.2 lb

## 2014-10-04 DIAGNOSIS — Z90722 Acquired absence of ovaries, bilateral: Secondary | ICD-10-CM | POA: Insufficient documentation

## 2014-10-04 DIAGNOSIS — Z1504 Genetic susceptibility to malignant neoplasm of endometrium: Secondary | ICD-10-CM

## 2014-10-04 DIAGNOSIS — C189 Malignant neoplasm of colon, unspecified: Secondary | ICD-10-CM

## 2014-10-04 DIAGNOSIS — C541 Malignant neoplasm of endometrium: Secondary | ICD-10-CM | POA: Insufficient documentation

## 2014-10-04 NOTE — Patient Instructions (Addendum)
Follow-up with Dr. Hulan Fray 10/2015 Follow-up April 25, 2015 with Dr. Skeet Latch  We will refer you to Genetic Counseling at the [facility].  Thank you very much [name] for allowing me to provide care for you today.  I appreciate your confidence in choosing our Gynecologic Oncology team.  If you have any questions about your visit today please call our office and we will get back to you as soon as possible.  Please consider using the website Medlineplus.gov as an Geneticist, molecular.   Laura Douglas. Laura Mccleod MD., PhD Gynecologic Oncology

## 2014-10-04 NOTE — Progress Notes (Signed)
HPI:  Laura Douglas is a 54 y.o. year old [id number] initially seen in consultation on 08/13/2014 for grade 1 endometrial cancer.  She then underwent a [place] on 7/49/4496 without complications.  Her postoperative course was uncomplicated.  Her final pathologic diagnosis is a Stage IA Grade 1 endometrioid endometrial cancer with no lymphovascular space invasion, 0 mm of myometrial invasion and negative lymph nodes.  History is notable for colon cancer 2009  Mismatch Repair (MMR) Protein Immunohistochemistry (IHC) on endometrial tumoe IHC Expression Result: MLH1: Preserved nuclear expression (greater 50% tumor expression) MSH2: LOSS OF NUCLEAR EXPRESSION (LESS THAN 5% TUMOR EXPRESSION) MSH6: LOSS OF NUCLEAR EXPRESSION (LESS THAN 5% TUMOR EXPRESSION) PMS2: Preserved nuclear expression (greater 50% tumor expression) * Internal control demonstrates intact nuclear expression  She is seen today for a postoperative check and to discuss her pathology results and treatment plan.  Since discharge from the hospital, she is feeling well.  She has improving appetite, normal bowel and bladder function, and pain controlled with minimal PO medication. She has no other complaints today.    Review of systems: Constitutional:  She has no weight gain or weight loss. She has no fever or chills. Eyes: No blurred vision Ears, Nose, Mouth, Throat: No dizziness, headaches or changes in hearing. No mouth sores. Cardiovascular: No chest pain, palpitations or edema. Respiratory:  No shortness of breath, wheezing or cough Gastrointestinal: She has normal bowel movements without diarrhea or constipation. She denies any nausea or vomiting. She denies blood in her stool or heart burn. Genitourinary:  She denies pelvic pain, pelvic pressure or changes in her urinary function. She has no hematuria, dysuria, or incontinence. She has no irregular vaginal bleeding or vaginal discharge Musculoskeletal: Denies  muscle weakness or joint pains.  Skin:  She has no skin changes, rashes or itching Neurological:  Denies dizziness or headaches. No neuropathy, no numbness or tingling. Psychiatric:  She denies depression or anxiety. Hematologic/Lymphatic:   No easy bruising or bleeding   Physical Exam: Blood pressure 122/73, pulse 107, temperature 98.2 F (36.8 C), temperature source Oral, resp. rate 20, height 5' (1.524 m), weight 160 lb 3.2 oz (72.666 kg). General: Well dressed, well nourished in no apparent distress.   HEENT:  Normocephalic and atraumatic, no lesions.  Extraocular muscles intact. Sclerae anicteric. Pupils equal, round, reactive. No mouth sores or ulcers. Thyroid is normal size, not nodular, midline. Skin:  No lesions or rashes. Breasts:  Soft, symmetric.  No skin or nipple changes.  No palpable LN or masses. Lungs:  Clear to auscultation bilaterally.  No wheezes. Cardiovascular:  Regular rate and rhythm.  No murmurs or rubs. Abdomen:  Soft, nontender, nondistended.  No palpable masses.  No hepatosplenomegaly.  No ascites. Normal bowel sounds.  No hernias.  Incision is C/D/I no erythema, tenderness or discharge from port sites Genitourinary: Normal EGBUS  Vaginal cuff intact.  No bleeding or discharge.  No cul de sac fullness. Extremities: No cyanosis, clubbing or edema.  No calf tenderness or erythema. No palpable cords. Psychiatric: Mood and affect are appropriate. Neurological: Awake, alert and oriented x 3. Sensation is intact, no neuropathy.  Musculoskeletal: No pain, normal strength and range of motion.  Assessment:    55 y.o. year old with Stage IA Grade 1 endometrioid endometrial cancer.   S/p RATLH BSO BPLND 09/11/2014 on . No LVSI, 0% myometrial invasion, neg  lymph nodes. 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  We will start with visits every 6 months x 2 years, then every 6 months for 3 more years, at which time she can return to annual visits.  Discussed signs and symptoms of recurrence including vaginal bleeding or discharge, leg pain or swelling and changes in bowel or bladder habits. She was given the opportunity to ask questions, which were answered to her satisfaction, and she is agreement with the above mentioned plan of care. 3)  Return to clinic in 6 months 4) Referral for genetic counseling and possible germline testing for HNPCC Follow-up with Dr. Hulan Fray in 6 months

## 2014-10-08 ENCOUNTER — Telehealth: Payer: Self-pay | Admitting: Genetic Counselor

## 2014-10-08 NOTE — Telephone Encounter (Signed)
Used interprutting service to contact patient and scheduled appt to see  Laura Douglas 10/17/14 1 pm  Dx: Endometrial Adenocarcinoma Referring: Joylene John

## 2014-10-17 ENCOUNTER — Other Ambulatory Visit: Payer: Self-pay

## 2014-10-17 ENCOUNTER — Encounter: Payer: Self-pay | Admitting: Genetic Counselor

## 2014-10-17 ENCOUNTER — Ambulatory Visit (HOSPITAL_BASED_OUTPATIENT_CLINIC_OR_DEPARTMENT_OTHER): Payer: Self-pay | Admitting: Genetic Counselor

## 2014-10-17 DIAGNOSIS — C541 Malignant neoplasm of endometrium: Secondary | ICD-10-CM

## 2014-10-17 DIAGNOSIS — Z8049 Family history of malignant neoplasm of other genital organs: Secondary | ICD-10-CM

## 2014-10-17 DIAGNOSIS — Z315 Encounter for genetic counseling: Secondary | ICD-10-CM

## 2014-10-17 DIAGNOSIS — Z85038 Personal history of other malignant neoplasm of large intestine: Secondary | ICD-10-CM

## 2014-10-17 DIAGNOSIS — C189 Malignant neoplasm of colon, unspecified: Secondary | ICD-10-CM

## 2014-10-17 DIAGNOSIS — C55 Malignant neoplasm of uterus, part unspecified: Secondary | ICD-10-CM

## 2014-10-17 NOTE — Progress Notes (Signed)
REFERRING PROVIDER: Doylene Bode, NP [address] [place], [place] [address]   Laurette Schimke, MD  PRIMARY PROVIDER:  No PCP Per Patient  PRIMARY REASON FOR VISIT:  1. Uterine cancer   2. Colon cancer   3. Family history of uterine cancer   4. Endometrial cancer      HISTORY OF PRESENT ILLNESS:   Laura Douglas, a 54 y.o. female, was seen for a [place] cancer genetics consultation at the request of Warner Mccreedy, NP due to a personal history of cancer.  Laura Douglas presents to clinic today to discuss the possibility of a hereditary predisposition to cancer, genetic testing, and to further clarify her future cancer risks, as well as potential cancer risks for family members. Genetic counseling was performed through an interpreter.  In 2009, at the age of 17, Laura Douglas was diagnosed with colon cancer. This was treated with in [place] with surgery only. In 2016, at the age of 16, Laura Douglas was diagnosed with uterine cancer.  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  CANCER HISTORY:   No history exists.     HORMONAL RISK FACTORS:  Menarche was at age 24.  First live birth at age 53.  OCP use for approximately 0 years.  Ovaries intact: no.  Hysterectomy: yes.  Menopausal status: postmenopausal.  HRT use: 0 years. Colonoscopy: no colonoscopy since her colon cancer diagnosis; not examined. Mammogram within the last year: no. Number of breast biopsies: 0. Up to date with pelvic exams:  no. Any excessive radiation exposure in the past:  no  Past Medical History  Diagnosis Date  . Hypertension   . Obesity   . Colon cancer     possible? Patient had surgery in [place]. Denies needing adjuvant therapy. The surgery "removed a tumor from the colon".  . Diabetes mellitus without complication   . 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 Family history of uterine cancer     Past  Surgical History  Procedure Laterality Date  . Fracture surgery  2006    Lt lower arm  . Colon surgery  2010    tumor removal  . Tubal ligation  1993  . Multiple tooth extractions    . Abortions      twice   . Robotic assisted total hysterectomy with bilateral salpingo oopherectomy Bilateral 09/11/2014    Procedure: ROBOTIC ASSISTED TOTAL LAPAROSCOPIC HYSTERECTOMY WITH BILATERAL SALPINGO OOPHORECTOMY LYMPH NODE DISSECTION; LYSIS OF ADHESIONS;  Surgeon: Laurette Schimke, MD;  Location: [facility];  Service: Gynecology;  Laterality: Bilateral;    History   Social History  . Marital Status: Single    Spouse Name: N/A  . Number of Children: 4  . Years of Education: N/A   Social History Main Topics  . Smoking status: Never Smoker   . Smokeless tobacco: Never Used  . Alcohol Use: Yes     Comment: occasionally has beer  . Drug Use: No  . Sexual Activity: Not Currently    Birth Control/ Protection: Surgical   Other Topics Concern  . None   Social History Narrative     FAMILY HISTORY:  We obtained a detailed, 4-generation family history.  Significant diagnoses are listed below: Family History  Problem Relation Age of Onset  . Prostate cancer Father   . Diabetes Mother   . Uterine cancer Sister 91  . Uterine cancer Maternal Aunt  The patient had one brother and has two sisters.  One sister was diagnosed recently with uterine cancer.  She is in [place].  Laura Douglas has one daughter and two sons alive, and one son who is deceased.  Her mother is alive at 22, and her father passed away with prostate cancer at 33.  He had 8 brothers and 1 sister.  His sister had uterine cancer and died before Laura Douglas was born.  Laura Douglas does not have information on her mother's family. Patient's maternal ancestors are of [place] descent, and paternal ancestors are of [place] descent. There is no reported Ashkenazi Jewish ancestry. There is no known consanguinity.  GENETIC COUNSELING  ASSESSMENT: Laura Douglas is a 54 y.o. female with a personal history of colon and uterine cancer which somewhat suggestive of a Lynch syndrome and predisposition to cancer. We, therefore, discussed and recommended the following at today's visit.   DISCUSSION: We reviewed the characteristics, features and inheritance patterns of hereditary cancer syndromes. We reviewed her tumor testing and that it suggests an MSH2 mutation. We discussed genetic testing based on her insurance status.  Currently she is in the [place] under political asylum, and does not have residency yet.  Therefore, she is unable to obtain [place] Medicaid. We have gone through [employer] assistance program to see if we can get genetic testing through this program.  If we are not able to obtain testing through the MAP, we can wait until she qualifies for [facility] Medicaid and perform genetic testing at that time.  We also discussed genetic testing, including the appropriate family members to test, the process of testing, insurance coverage and turn-around-time for results. We discussed the implications of a negative, positive and/or variant of uncertain significant result. We recommended Laura Douglas pursue genetic testing for the [facility] gene panel.   PLAN: After considering the risks, benefits, and limitations, Laura Douglas  provided informed consent to pursue genetic testing and the blood sample was sent to [facility] for analysis of the [facility] panel test. Results should be available within approximately 2-3 weeks' time, at which point they will be disclosed by telephone to Laura Douglas, as will any additional recommendations warranted by these results. Laura Douglas will receive a summary of her genetic counseling visit and a copy of her results once available. This information will also be available in Epic. We encouraged Laura Douglas to remain in contact with cancer  genetics annually so that we can continuously update the family history and inform her of any changes in cancer genetics and testing that may be of benefit for her family. Laura Douglas questions were answered to her satisfaction today. Our contact information was provided should additional questions or concerns arise.  Lastly, we encouraged Laura Douglas to remain in contact with cancer genetics annually so that we can continuously update the family history and inform her of any changes in cancer genetics and testing that may be of benefit for this family.   Ms.  Douglas questions were answered to her satisfaction today. Our contact information was provided should additional questions or concerns arise. Thank you for the referral and allowing [place] to share in the care of your patient.   Karen P. Lowell Guitar, MS, [facility] Certified Genetic Counselor [email or web address] phone: [phone]  The patient was seen for a total of 60 minutes in face-to-face genetic counseling.  This patient was discussed with Drs. Magrinat, Pamelia Hoit and/or Mosetta Putt who agrees with the above.  _______________________________________________________________________ For Office Staff:  Number of people involved in session: 2 Was an Intern/ student involved with case: no

## 2014-10-26 ENCOUNTER — Encounter: Payer: Self-pay | Admitting: Genetic Counselor

## 2014-10-26 DIAGNOSIS — Z1379 Encounter for other screening for genetic and chromosomal anomalies: Secondary | ICD-10-CM

## 2014-10-26 NOTE — Progress Notes (Signed)
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  GIven the patient's IHC testing that found LOS of MSH2, along with the personal and family history of colon/uterine cancer, I called [facility] to enquire about this variant.  They have seen this variant a total of 3 times, including this patient.  All three patients that they have seen this in are of [place] descent.  The previous two patients had early onset colon and duodeum cancer, but no IHC information was available on those individuals.  They will present this case to the variant testing team to determine if they want to re-evaluate this variant.  I have looked in ClinVar, one of the Variant databases, and did not see this variant listed.  Therefore I can contacted [facility], [facility] and [facility] to see if they have seen this variant and if so, how they classify this change.  [facility] and [facility] have not seen this variant before and therefore do not have a classification to provide, [facility] has seen this and calls it a VUS.   Once this information is gathered, I will contact the patient.  Laura Kayser, MS, [facility] Certified Genetic Counselor [phone]  Update:  [facility] will not reclassify this variant at this time, but will review it as a team and see if there is any additional information on it.  They also suggest having the patient participate in the variant testing program.

## 2014-10-29 ENCOUNTER — Telehealth: Payer: Self-pay | Admitting: Genetic Counselor

## 2014-10-29 ENCOUNTER — Encounter: Payer: Self-pay | Admitting: Genetic Counselor

## 2014-10-29 DIAGNOSIS — C55 Malignant neoplasm of uterus, part unspecified: Secondary | ICD-10-CM

## 2014-10-29 DIAGNOSIS — Z8049 Family history of malignant neoplasm of other genital organs: Secondary | ICD-10-CM

## 2014-10-29 DIAGNOSIS — Z1379 Encounter for other screening for genetic and chromosomal anomalies: Secondary | ICD-10-CM

## 2014-10-29 DIAGNOSIS — C189 Malignant neoplasm of colon, unspecified: Secondary | ICD-10-CM

## 2014-10-29 DIAGNOSIS — C541 Malignant neoplasm of endometrium: Secondary | ICD-10-CM

## 2014-10-29 NOTE — Telephone Encounter (Signed)
Reported the test results, through an interpter, that we found an MSH2 VUS and therefore her test was inconclusive.  We will plan on following her as if she has MSH2 Lynch associated cancers and her family member should also be followed as such.  We would not recommend genetic testing for this VUS in family members, however.  We are following her as if she has Lynch based on her personal and family history of cancer, not the VUS.  The laboratory should hopefully be able to figure out whether this is a disease causing change or not and reclassify it in the future.

## 2014-10-29 NOTE — Telephone Encounter (Signed)
Ms. Lyne called me directly.  I told her I would call back with an interptor.  I called, and the phone went directly to VM.  Left another message asking that she call back for results, and to please leave a phone number where it would be easy to reach her.

## 2014-10-29 NOTE — Progress Notes (Signed)
HPI: Ms. Slevin was previously seen in the [place] clinic due to a personal and family history of cancer and concerns regarding a hereditary predisposition to cancer. Please refer to our prior cancer genetics clinic note for more information regarding Ms. Bringle medical, social and family histories, and our assessment and recommendations, at the time. Ms. Anker recent genetic test results were disclosed to her, as were recommendations warranted by these results. These results and recommendations are discussed in more detail below.  GENETIC TEST RESULTS: At the time of Ms. Hieronymus visit, we recommended she pursue genetic testing of the [facility] gene panel. The [facility] gene panel offered by [employer] includes sequencing and deletion/duplication testing of the following 25 genes: APC, ATM, BARD1, BMPR1A, BRCA1, BRCA2, BRIP1, CHD1, CDK4, CDKN2A, CHEK2, EPCAM (large rearrangement only), MLH1, MSH2, MSH6, MUTYH, NBN, PALB2, PMS2, PTEN, RAD51C, RAD51D, SMAD4, STK11, and TP53.   The report date is Oct 25, 2014.  Genetic testing was normal, and did not reveal a deleterious mutation in these genes. The test report has been scanned into EPIC and is located under the Media tab.   We discussed with Ms. Zucker that since the current genetic testing is not perfect, it is possible there may be a gene mutation in one of these genes that current testing cannot detect, but that chance is small. We also discussed, that it is possible that another gene that has not yet been discovered, or that we have not yet tested, is responsible for the cancer diagnoses in the family, and it is, therefore, important to remain in touch with cancer genetics in the future so that we can continue to offer Ms. Dolder the most up to date genetic testing.   Genetic testing did detect a Variant of Unknown Significance in the MSH2 gene called [id number].   At this time, it is unknown if this variant is associated with increased cancer risk or if this is a normal finding, but most variants such as this get reclassified to being inconsequential. It should not be used to make medical management decisions. With time, we suspect the lab will determine the significance of this variant, if any. If we do learn more about it, we will try to contact Ms. Zhang to discuss it further. However, it is important to stay in touch with [place] periodically and keep the address and phone number up to date.   ADDITIONAL GENETIC TESTING: We discussed with Ms. Monterosso that there are other genes that are associated with increased cancer risk that can be analyzed. The laboratories that offer such testing look at these additional genes via a hereditary cancer gene panel. Should Ms. Wickliffe wish to pursue additional genetic testing, we are happy to discuss and coordinate this testing, at any time.    CANCER SCREENING RECOMMENDATIONS: Given Ms. Pittmon personal and family histories, we must interpret these negative results with some caution.  Families with features suggestive of hereditary risk for cancer tend to have multiple family members with cancer, diagnoses in multiple generations and diagnoses before the age of 38. Ms. Hise family exhibits some of these features. Thus this result may simply reflect our current inability to detect all mutations within these genes or there may be a different gene that has not yet been discovered or tested.   Ms. Markwell meets the criteria to follow her as if she has Lynch syndrome.  Based on her IHC tumor testing, suggesting that she has a mutation in MSH2 and her  MSH2 VUS, we should follow her based on NCCN MSH2 guidelines.  This would include:  1. Annual colonoscopy.   2. While there is no clear evidence to support screening for stomach and small bowel cancer, an upper endoscopy can be  considered at 3-5 year intervals beginning at age 69-35. However, whether to have this screening is best determined by the gastroenterologist.   3. Annual urinalysis.   For women with Lynch syndrome, unlike the effective surveillance plan for colorectal cancer risk, there is no professional agreement regarding management for the increased risk of uterine and ovarian cancer. However, we are available to help women and their providers establish an individualized surveillance plan. It is also important for women to understand the following:   1. Women should seek medical attention if they experience abnormal vaginal bleeding.  2. Some providers may still recommend vaginal ultrasounds, uterine biopsies (for uterine cancer risk) and/or CA-125 analysis ( for ovarian cancer risk), even though these have not been shown to be effective.  3. A hysterectomy with removal of the ovaries and fallopian tubes should be considered once childbearing is completed (if planned).  RECOMMENDATIONS FOR FAMILY MEMBERS: Family members should be followed based on the family history of Lynch syndrome cancers.  Therefore they should consider screening similarly to the above recommendations.  We would not recommend testing for the MSH2 VUS, as we cannot be sure that the individuals who are negative for the VUS would not be at a higher risk for cancer.  FOLLOW-UP: Lastly, we discussed with Ms. Bignell that cancer genetics is a rapidly advancing field and it is possible that new genetic tests will be appropriate for her and/or her family members in the future. We encouraged her to remain in contact with cancer genetics on an annual basis so we can update her personal and family histories and let her know of advances in cancer genetics that may benefit this family.   Our contact number was provided. Ms. Fiala questions were answered to her satisfaction, and she knows she is welcome to call us at anytime with  additional questions or concerns.   Roma Kayser, MS, [facility] Certified Genetic Counselor [email or web address]

## 2014-10-29 NOTE — Telephone Encounter (Signed)
Through an interpreter, LM on VM that we had results and to please call back.  Interpreter called twice.  The first time she answered and asked who it was.  When interpreter told her it was the hospital, she hung up. When we called back we LM on VM.

## 2015-03-20 ENCOUNTER — Other Ambulatory Visit: Payer: Self-pay | Admitting: Gynecologic Oncology

## 2015-03-20 DIAGNOSIS — Z1231 Encounter for screening mammogram for malignant neoplasm of breast: Secondary | ICD-10-CM

## 2015-04-02 ENCOUNTER — Ambulatory Visit
Admission: RE | Admit: 2015-04-02 | Discharge: 2015-04-02 | Disposition: A | Discharge status: Home / Self Care | Payer: No Typology Code available for payment source | Source: Ambulatory Visit | Attending: Gynecologic Oncology | Admitting: Gynecologic Oncology

## 2015-04-02 DIAGNOSIS — Z1231 Encounter for screening mammogram for malignant neoplasm of breast: Secondary | ICD-10-CM

## 2015-04-25 ENCOUNTER — Ambulatory Visit: Payer: Self-pay | Attending: Gynecologic Oncology | Admitting: Gynecologic Oncology

## 2015-04-25 ENCOUNTER — Encounter: Payer: Self-pay | Admitting: Gynecologic Oncology

## 2015-04-25 VITALS — BP 129/87 | HR 90 | Temp 98.5°F | Resp 18 | Ht 60.0 in | Wt 162.4 lb

## 2015-04-25 DIAGNOSIS — C541 Malignant neoplasm of endometrium: Secondary | ICD-10-CM | POA: Insufficient documentation

## 2015-04-25 DIAGNOSIS — Z85038 Personal history of other malignant neoplasm of large intestine: Secondary | ICD-10-CM

## 2015-04-25 DIAGNOSIS — Z1504 Genetic susceptibility to malignant neoplasm of endometrium: Secondary | ICD-10-CM | POA: Insufficient documentation

## 2015-04-25 NOTE — Progress Notes (Signed)
GYN ONCOLOGY OFFICE VISIT  CHIEF COMPLAINT:  Endometrial cancer surveillance  HPI:  Laura Douglas is a 54 y.o. year old [id number] initially seen in consultation on 08/13/2014 for grade 1 endometrial cancer.  She then underwent a [place] on 2/99/3716 without complications.  Her postoperative course was uncomplicated.  Her final pathologic diagnosis is a Stage IA Grade 1 endometrioid endometrial cancer with no lymphovascular space invasion, 0 mm of myometrial invasion and negative lymph nodes.  History is notable for colon cancer 2009  Mismatch Repair (MMR) Protein Immunohistochemistry (IHC) on endometrial tumoe IHC Expression Result: MLH1: Preserved nuclear expression (greater 50% tumor expression) MSH2: LOSS OF NUCLEAR EXPRESSION (LESS THAN 5% TUMOR EXPRESSION) MSH6: LOSS OF NUCLEAR EXPRESSION (LESS THAN 5% TUMOR EXPRESSION) PMS2: Preserved nuclear expression (greater 50% tumor expression) * Internal control demonstrates intact nuclear expression  Review of systems: Constitutional:  She has no weight gain or weight loss. She has no fever or chills. Eyes: No blurred vision Ears, Nose, Mouth, Throat: No dizziness, headaches or changes in hearing. No mouth sores. Cardiovascular: No chest pain, palpitations or edema. Respiratory:  No shortness of breath, wheezing or cough Gastrointestinal: She has normal bowel movements without diarrhea or constipation. She denies any nausea or vomiting. She denies blood in her stool or heart burn. Genitourinary:  She denies pelvic pain, pelvic pressure or changes in her urinary function. She has no hematuria, dysuria, or incontinence. She has no irregular vaginal bleeding or vaginal discharge Musculoskeletal: Denies muscle weakness or joint pains.  Skin:  She has no skin changes, rashes or itching Neurological:  Denies dizziness or headaches. No neuropathy, no numbness or tingling. Psychiatric:  She denies depression or  anxiety. Hematologic/Lymphatic:   No easy bruising or bleeding   Past Medical History  Diagnosis Date  . Hypertension   . Obesity   . Colon cancer [facility])     possible? Patient had surgery in [place]. Denies needing adjuvant therapy. The surgery "removed a tumor from the colon".  . Diabetes mellitus without complication ([place])   . 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 Family history of uterine cancer     Past Surgical History  Procedure Laterality Date  . Fracture surgery  2006    Lt lower arm  . Colon surgery  2010    tumor removal  . Tubal ligation  1993  . Multiple tooth extractions    . Abortions      twice   . Robotic assisted total hysterectomy with bilateral salpingo oopherectomy Bilateral 09/11/2014    Procedure: ROBOTIC ASSISTED TOTAL LAPAROSCOPIC HYSTERECTOMY WITH BILATERAL SALPINGO OOPHORECTOMY LYMPH NODE DISSECTION; LYSIS OF ADHESIONS;  Surgeon: Janie Morning, MD;  Location: [facility];  Service: Gynecology;  Laterality: Bilateral;     Physical Exam: Blood pressure 129/87, pulse 90, temperature 98.5 F (36.9 C), temperature source Oral, resp. rate 18, height 5' (1.524 m), weight 162 lb 6.4 oz (73.664 kg), SpO2 100 %. General: Well dressed, well nourished in no apparent distress.   Skin:  No lesions or rashes. Lungs:  Clear to auscultation bilaterally.  No wheezes. Cardiovascular:  Regular rate and rhythm.  No murmurs or rubs. Abdomen:  Soft, nontender, nondistended.  No palpable masses.  No hepatosplenomegaly.  No ascites. Normal bowel sounds.  No hernias.  Incision is C/D/I no erythema, tenderness or discharge from port sites Genitourinary: Normal EGBUS  Vaginal cuff intact.  No bleeding or discharge.  No cul de sac fullness. Extremities: No  cyanosis, clubbing or edema.  No calf tenderness or erythema. No palpable cords. Psychiatric: Mood and affect are appropriate. Neurological: Awake, alert and oriented x 3. Sensation is intact, no neuropathy.   Musculoskeletal: No pain, normal strength and range of motion.  Assessment:    54 y.o. year old with Stage IA Grade 1 endometrioid endometrial cancer.   S/p RATLH BSO BPLND 09/11/2014 on . No LVSI, 0% myometrial invasion, neg  lymph nodes. MSH6 and MLH2 loss of nuclear expression, genetic testing MSH2 VUS  Presumed to be pathologic given 2 primary related malignancies  Plan: MSH2 VUS  Urine for cytology at next visit Mammogram 03/2015 wnl  Will identify PCP

## 2015-04-25 NOTE — Patient Instructions (Signed)
Plan to follow up with Dr. Skeet Latch in six months or sooner if needed.  We will check your urine at that time.  We will contact you with information about obtaining a primary care doctor.  Please call for any questions or concerns.

## 2015-05-20 ENCOUNTER — Telehealth: Payer: Self-pay

## 2015-05-20 NOTE — Telephone Encounter (Signed)
[facility] [phone] contacted , patient was scheduled for Jun 03, 2015 at 2:30 . Patient is to bring a from of identification , current list of medications and $20.00 for a co-pay. Appointment was scheduled to initiate care with a PCP . Almyra Free from interpreter services was contacted with request to contact the patient and update her with this information . The was contacted and updated , Almyra Free states the patient states understanding and is grateful for the effort made to obtain this appointment . Patient denied further questions or concerns at this time , Dr Everitt Amber and Joylene John were updated with the appointment .

## 2015-06-03 ENCOUNTER — Ambulatory Visit: Payer: Self-pay | Attending: Family Medicine | Admitting: Family Medicine

## 2015-06-03 ENCOUNTER — Encounter: Payer: Self-pay | Admitting: Family Medicine

## 2015-06-03 DIAGNOSIS — I10 Essential (primary) hypertension: Secondary | ICD-10-CM | POA: Insufficient documentation

## 2015-06-03 DIAGNOSIS — C189 Malignant neoplasm of colon, unspecified: Secondary | ICD-10-CM | POA: Insufficient documentation

## 2015-06-03 DIAGNOSIS — C541 Malignant neoplasm of endometrium: Secondary | ICD-10-CM | POA: Insufficient documentation

## 2015-06-03 DIAGNOSIS — Z7984 Long term (current) use of oral hypoglycemic drugs: Secondary | ICD-10-CM | POA: Insufficient documentation

## 2015-06-03 DIAGNOSIS — Z8542 Personal history of malignant neoplasm of other parts of uterus: Secondary | ICD-10-CM | POA: Insufficient documentation

## 2015-06-03 DIAGNOSIS — Z6830 Body mass index (BMI) 30.0-30.9, adult: Secondary | ICD-10-CM | POA: Insufficient documentation

## 2015-06-03 DIAGNOSIS — E663 Overweight: Secondary | ICD-10-CM | POA: Insufficient documentation

## 2015-06-03 DIAGNOSIS — R140 Abdominal distension (gaseous): Secondary | ICD-10-CM | POA: Insufficient documentation

## 2015-06-03 DIAGNOSIS — M549 Dorsalgia, unspecified: Secondary | ICD-10-CM | POA: Insufficient documentation

## 2015-06-03 DIAGNOSIS — M545 Low back pain, unspecified: Secondary | ICD-10-CM

## 2015-06-03 DIAGNOSIS — E119 Type 2 diabetes mellitus without complications: Secondary | ICD-10-CM | POA: Insufficient documentation

## 2015-06-03 DIAGNOSIS — K219 Gastro-esophageal reflux disease without esophagitis: Secondary | ICD-10-CM | POA: Insufficient documentation

## 2015-06-03 DIAGNOSIS — Z0000 Encounter for general adult medical examination without abnormal findings: Secondary | ICD-10-CM | POA: Insufficient documentation

## 2015-06-03 DIAGNOSIS — Z85038 Personal history of other malignant neoplasm of large intestine: Secondary | ICD-10-CM | POA: Insufficient documentation

## 2015-06-03 LAB — POCT GLYCOSYLATED HEMOGLOBIN (HGB A1C): Hemoglobin A1C: 7.7

## 2015-06-03 LAB — GLUCOSE, POCT (MANUAL RESULT ENTRY): POC Glucose: 188 mg/dl — AB (ref 70–99)

## 2015-06-03 MED ORDER — LISINOPRIL 2.5 MG PO TABS: 2.5000 mg | ORAL_TABLET | Freq: Every day | ORAL | Status: DC

## 2015-06-03 MED ORDER — NAPROXEN 500 MG PO TABS: 500.0000 mg | ORAL_TABLET | Freq: Two times a day (BID) | ORAL | Status: DC

## 2015-06-03 MED ORDER — PANTOPRAZOLE SODIUM 40 MG PO TBEC: 40.0000 mg | DELAYED_RELEASE_TABLET | Freq: Every day | ORAL | Status: DC

## 2015-06-03 MED ORDER — METFORMIN HCL 500 MG PO TABS: 500.0000 mg | ORAL_TABLET | Freq: Two times a day (BID) | ORAL | Status: DC

## 2015-06-03 NOTE — Progress Notes (Signed)
Est.care with PCP, HTN, cancer f/up. Patient states that she's having lower back pain rated at 4/10.  Pain is off and on. Described as discomfort x's several weeks.

## 2015-06-04 ENCOUNTER — Ambulatory Visit: Payer: Self-pay | Attending: Family Medicine

## 2015-06-04 DIAGNOSIS — E119 Type 2 diabetes mellitus without complications: Secondary | ICD-10-CM

## 2015-06-04 LAB — COMPLETE METABOLIC PANEL WITH GFR
ALT: 30 U/L — AB (ref 6–29)
AST: 23 U/L (ref 10–35)
Albumin: 4.5 g/dL (ref 3.6–5.1)
Alkaline Phosphatase: 55 U/L (ref 33–130)
BILIRUBIN TOTAL: 0.6 mg/dL (ref 0.2–1.2)
BUN: 13 mg/dL (ref 7–25)
CALCIUM: 9.6 mg/dL (ref 8.6–10.4)
CHLORIDE: 101 mmol/L (ref 98–110)
CO2: 23 mmol/L (ref 20–31)
CREATININE: 0.66 mg/dL (ref 0.50–1.05)
GFR, Est African American: >89 mL/min (ref >=60)
GFR, Est Non African American: >89 mL/min (ref >=60)
Glucose, Bld: 149 mg/dL — ABNORMAL HIGH (ref 65–99)
Potassium: 4.5 mmol/L (ref 3.5–5.3)
Sodium: 137 mmol/L (ref 135–146)
Total Protein: 7.1 g/dL (ref 6.1–8.1)

## 2015-06-04 LAB — LIPID PANEL
CHOLESTEROL: 253 mg/dL — AB (ref 125–200)
HDL: 38 mg/dL — ABNORMAL LOW (ref >=46)
LDL Cholesterol: 148 mg/dL — ABNORMAL HIGH (ref <130)
Total CHOL/HDL Ratio: 6.7 Ratio — ABNORMAL HIGH (ref <=5.0)
Triglycerides: 336 mg/dL — ABNORMAL HIGH (ref <150)
VLDL: 67 mg/dL — AB (ref <30)

## 2015-06-04 NOTE — Progress Notes (Signed)
Subjective:  Patient ID: Laura Douglas, female    DOB: 1960-10-14  Age: 54 y.o. MRN: [id number]  CC: Hypertension   HPI Laura Douglas is  54 year old female with a history of colon cancer diagnosed in 2009 (status post surgery in [place]), stage 1A Grade 1 endometrial cancer diagnosed in 07/2014 (status post robotic-assisted total laparoscopic hysterectomy with bilateral salpingo-oophorectomy, lymph node dissection, lysis of adhesions), type 2 diabetes mellitus (A1c of 7.7 currently on metformin)  who comes into the clinic to establish care today. Her last visit to GYN oncology was on 04/25/2015 and the plan is for urine cytology at her next office visit.   She has been on metformin for diabetes mellitus but states she ran out of metformin 3 weeks ago. Today she complains of low back pain which is intermittent and rated as a 4/10 with no radiation to extremities and no numbness or weakness in lower extremities. She denies loss of sphincteric function.  She complains of abdominal bloating which she describes as "a feeling of gassiness in her abdomen"and denies constipation or diarrhea or nausea or vomiting. She has no abdominal pain.  Outpatient Prescriptions Prior to Visit  Medication Sig Dispense Refill  . metFORMIN (GLUCOPHAGE) 500 MG tablet Take 1 tablet (500 mg total) by mouth daily with breakfast. 30 tablet 1  . acetaminophen (TYLENOL) 325 MG tablet Take 650 mg by mouth as needed. Reported on 06/03/2015    . guaiFENesin (ROBITUSSIN) 100 MG/5ML SOLN Take 10 mLs by mouth every 4 (four) hours as needed for cough or to loosen phlegm. Reported on 06/03/2015     No facility-administered medications prior to visit.    ROS Review of Systems  Constitutional: Negative for activity change, appetite change and fatigue.  HENT: Negative for congestion, sinus pressure and sore throat.   Eyes: Negative for visual disturbance.  Respiratory: Negative for cough, chest  tightness, shortness of breath and wheezing.   Cardiovascular: Negative for chest pain and palpitations.  Gastrointestinal: Negative for abdominal pain, constipation and abdominal distention.  Endocrine: Negative for polydipsia.  Genitourinary: Negative for dysuria and frequency.  Musculoskeletal: Positive for back pain. Negative for arthralgias.  Skin: Negative for rash.  Neurological: Negative for tremors, light-headedness and numbness.  Hematological: Does not bruise/bleed easily.  Psychiatric/Behavioral: Negative for behavioral problems and agitation.    Objective:  BP 124/79 mmHg  Pulse 93  Temp(Src) 98.3 F (36.8 C) (Oral)  Resp 16  Ht 5\' 1"  (1.549 m)  Wt 165 lb (74.844 kg)  BMI 31.19 kg/m2  SpO2 97%  BP/Weight 06/03/2015 04/25/2015 [id number]  Systolic BP [id number] [id number]  Diastolic BP 79 87 73  Wt. (Lbs) 165 162.4 160.2  BMI 31.19 31.72 31.29      Physical Exam  Constitutional: She is oriented to person, place, and time. She appears well-developed and well-nourished.  Cardiovascular: Normal rate, normal heart sounds and intact distal pulses.   No murmur heard. Pulmonary/Chest: Effort normal and breath sounds normal. She has no wheezes. She has no rales. She exhibits no tenderness.  Abdominal: Soft. Bowel sounds are normal. She exhibits no distension and no mass. There is no tenderness.  Musculoskeletal: She exhibits tenderness (mild left-sided lower back tenderness. Negative straight leg raise bilaterally).  Neurological: She is alert and oriented to person, place, and time.  Skin: Skin is warm.     Assessment & Plan:   1. Overweight  Advised to increase physical activity and cut back on portion sizes  2. Diabetes mellitus  without complication (HCC) 123456 is 7.7 which is above goal of less than 7. Increased metformin dose from 500 mg daily to 500 mg twice daily - POCT A1C - Glucose (CBG) - metFORMIN (GLUCOPHAGE) 500 MG tablet; Take 1 tablet (500 mg total) by  mouth 2 (two) times daily with a meal.  Dispense: 60 tablet; Refill: 2 - lisinopril (PRINIVIL,ZESTRIL) 2.5 MG tablet; Take 1 tablet (2.5 mg total) by mouth daily.  Dispense: 30 tablet; Refill: 2 - COMPLETE METABOLIC PANEL WITH GFR; Future - Lipid panel; Future - Microalbumin/Creatinine Ratio, Urine; Future  3. Left-sided low back pain without sciatica Advised to use heating pad - naproxen (NAPROSYN) 500 MG tablet; Take 1 tablet (500 mg total) by mouth 2 (two) times daily with a meal.  Dispense: 60 tablet; Refill: 3  4. Endometrial cancer (HCC) Stage IA, grade 1 endometrial cancer, No lymphovascular space invasion, 0% myometrial invasion, negative lymph nodes Managed by GYN oncology  5. Malignant neoplasm of colon, unspecified part of colon ([place]) In remission. Keep appointment with [place]  6. Abdominal bloating Will treat presumptively for GERD and she has been advised to avoid late meals - pantoprazole (PROTONIX) 40 MG tablet; Take 1 tablet (40 mg total) by mouth daily.  Dispense: 30 tablet; Refill: 3  7. Health care maintenance - Flu Vaccine QUAD 36+ mos PF IM (Fluarix & Fluzone Quad PF)   Meds ordered this encounter  Medications  . metFORMIN (GLUCOPHAGE) 500 MG tablet    Sig: Take 1 tablet (500 mg total) by mouth 2 (two) times daily with a meal.    Dispense:  60 tablet    Refill:  2  . lisinopril (PRINIVIL,ZESTRIL) 2.5 MG tablet    Sig: Take 1 tablet (2.5 mg total) by mouth daily.    Dispense:  30 tablet    Refill:  2  . pantoprazole (PROTONIX) 40 MG tablet    Sig: Take 1 tablet (40 mg total) by mouth daily.    Dispense:  30 tablet    Refill:  3  . DISCONTD: naproxen (NAPROSYN) 500 MG tablet    Sig: Take 1 tablet (500 mg total) by mouth 2 (two) times daily with a meal.    Dispense:  30 tablet    Refill:  1  . naproxen (NAPROSYN) 500 MG tablet    Sig: Take 1 tablet (500 mg total) by mouth 2 (two) times daily with a meal.    Dispense:  60 tablet    Refill:  3     Discontinue previous dosing    Follow-up: Return in about 1 month (around 07/04/2015) for follow up of back pain and diabetes mellitus.   Arnoldo Morale MD

## 2015-06-05 ENCOUNTER — Other Ambulatory Visit: Payer: Self-pay | Admitting: Family Medicine

## 2015-06-05 ENCOUNTER — Telehealth: Payer: Self-pay

## 2015-06-05 DIAGNOSIS — E7800 Pure hypercholesterolemia, unspecified: Secondary | ICD-10-CM | POA: Insufficient documentation

## 2015-06-05 LAB — MICROALBUMIN / CREATININE URINE RATIO
Creatinine, Urine: 204 mg/dL (ref 20–320)
MICROALB UR: 1.1 mg/dL
MICROALB/CREAT RATIO: 5 ug/mg{creat} (ref <30)

## 2015-06-05 MED ORDER — ATORVASTATIN CALCIUM 20 MG PO TABS: 20.0000 mg | ORAL_TABLET | Freq: Every day | ORAL | Status: DC

## 2015-06-05 NOTE — Telephone Encounter (Signed)
CMA called [employer] and spoke to [place] [phone]. Interpreter verified name and DOB. Patient was given lab results, verbalized that she understood. However, the patient had a question as to why she's still having pain in her lower back (kidney area). I told her if symptoms persist, please come in for further evaluation. Patient agreed to my suggestion.

## 2015-06-05 NOTE — Telephone Encounter (Signed)
-----   Message from Arnoldo Morale, MD sent at 06/05/2015  4:57 PM EST ----- Labs revealed she has elevated cholesterol for which I have sent a prescription for atorvastatin to the pharmacy. Urine is negative for microalbuminuria.

## 2015-10-16 NOTE — Progress Notes (Signed)
GYN ONCOLOGY OFFICE VISIT  CHIEF COMPLAINT:  Endometrial cancer surveillance  HPI:  Laura Douglas is a 55 y.o. year old [id number] initially seen in consultation on 08/13/2014 for grade 1 endometrial cancer.  She then underwent a [place] on 0/30/0923 without complications.  Her postoperative course was uncomplicated.  Her final pathologic diagnosis is a Stage IA Grade 1 endometrioid endometrial cancer with no lymphovascular space invasion, 0 mm of myometrial invasion and negative lymph nodes.  History is notable for colon cancer 2009  Mismatch Repair (MMR) Protein Immunohistochemistry (IHC) on endometrial tumoe IHC Expression Result: MLH1: Preserved nuclear expression (greater 50% tumor expression) MSH2: LOSS OF NUCLEAR EXPRESSION (LESS THAN 5% TUMOR EXPRESSION) MSH6: LOSS OF NUCLEAR EXPRESSION (LESS THAN 5% TUMOR EXPRESSION) PMS2: Preserved nuclear expression (greater 50% tumor expression) * Internal control demonstrates intact nuclear expression  Review of systems: Constitutional:  She has no weight gain or weight loss. She has no fever or chills. Eyes: No blurred vision Ears, Nose, Mouth, Throat: No dizziness, headaches or changes in hearing. No mouth sores. Cardiovascular: No chest pain, palpitations or edema. Respiratory:  No shortness of breath, wheezing or cough Gastrointestinal: She has normal bowel movements without diarrhea or constipation. She denies any nausea or vomiting. She denies blood in her stool or heart burn. Genitourinary:  She denies pelvic pain, pelvic pressure or changes in her urinary function. She has no hematuria, dysuria, or incontinence. She has no irregular vaginal bleeding or vaginal discharge Musculoskeletal: Denies muscle weakness or joint pains.  Skin:  She has no skin changes, rashes or itching Neurological:  Denies dizziness or headaches. No neuropathy, no numbness or tingling. Psychiatric:  She denies depression or  anxiety. Hematologic/Lymphatic:   No easy bruising or bleeding   Past Medical History  Diagnosis Date  . Hypertension   . Obesity   . Colon cancer [facility])     possible? Patient had surgery in [place]. Denies needing adjuvant therapy. The surgery "removed a tumor from the colon".  . Diabetes mellitus without complication ([place])   . 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 Family history of uterine cancer     Past Surgical History  Procedure Laterality Date  . Fracture surgery  2006    Lt lower arm  . Colon surgery  2010    tumor removal  . Tubal ligation  1993  . Multiple tooth extractions    . Abortions      twice   . Robotic assisted total hysterectomy with bilateral salpingo oopherectomy Bilateral 09/11/2014    Procedure: ROBOTIC ASSISTED TOTAL LAPAROSCOPIC HYSTERECTOMY WITH BILATERAL SALPINGO OOPHORECTOMY LYMPH NODE DISSECTION; LYSIS OF ADHESIONS;  Surgeon: Janie Morning, MD;  Location: [facility];  Service: Gynecology;  Laterality: Bilateral;     Physical Exam: There were no vitals taken for this visit. General: Well dressed, well nourished in no apparent distress.   Skin:  No lesions or rashes. Lungs:  Clear to auscultation bilaterally.  No wheezes. Cardiovascular:  Regular rate and rhythm.  No murmurs or rubs. Abdomen:  Soft, nontender, nondistended.  No palpable masses.  No hepatosplenomegaly.  No ascites. Normal bowel sounds.  No hernias.  Incision is C/D/I no erythema, tenderness or discharge from port sites Genitourinary: Normal EGBUS  Vaginal cuff intact.  No bleeding or discharge.  No cul de sac fullness. Extremities: No cyanosis, clubbing or edema.  No calf tenderness or erythema. No palpable cords. Psychiatric: Mood and affect are appropriate. Neurological: Awake, alert  and oriented x 3. Sensation is intact, no neuropathy.  Musculoskeletal: No pain, normal strength and range of motion.  Assessment:    55 y.o. year old with Stage IA Grade 1 endometrioid  endometrial cancer.   S/p RATLH BSO BPLND 09/11/2014 on . No LVSI, 0% myometrial invasion, neg  lymph nodes. MSH6 and MLH2 loss of nuclear expression, genetic testing MSH2 VUS  Presumed to be pathologic given 2 primary related malignancies  Plan: MSH2 VUS  Urine for cytology collected  Will identify PCP

## 2015-10-17 ENCOUNTER — Ambulatory Visit: Payer: Self-pay | Admitting: Gynecologic Oncology

## 2015-10-17 ENCOUNTER — Telehealth: Payer: Self-pay | Admitting: Gynecologic Oncology

## 2015-10-17 NOTE — Telephone Encounter (Signed)
GYN ONCOLOGY OFFICE VISIT  CHIEF COMPLAINT:  Endometrial cancer surveillance  HPI:  Laura Douglas is a 55 y.o. year old [id number] initially seen in consultation on 08/13/2014 for grade 1 endometrial cancer.  She then underwent a [place] on 03/09/4627 without complications.  Her postoperative course was uncomplicated.  Her final pathologic diagnosis is a Stage IA Grade 1 endometrioid endometrial cancer with no lymphovascular space invasion, 0 mm of myometrial invasion and negative lymph nodes.  History is notable for colon cancer 2009  Mismatch Repair (MMR) Protein Immunohistochemistry (IHC) on endometrial tumoe IHC Expression Result: MLH1: Preserved nuclear expression (greater 50% tumor expression) MSH2: LOSS OF NUCLEAR EXPRESSION (LESS THAN 5% TUMOR EXPRESSION) MSH6: LOSS OF NUCLEAR EXPRESSION (LESS THAN 5% TUMOR EXPRESSION) PMS2: Preserved nuclear expression (greater 50% tumor expression) * Internal control demonstrates intact nuclear expression  Review of systems: Constitutional:  She has no weight gain or weight loss. She has no fever or chills. Eyes: No blurred vision Ears, Nose, Mouth, Throat: No dizziness, headaches or changes in hearing. No mouth sores. Cardiovascular: No chest pain, palpitations or edema. Respiratory:  No shortness of breath, wheezing or cough Gastrointestinal: She has normal bowel movements without diarrhea or constipation. She denies any nausea or vomiting. She denies blood in her stool or heart burn. Genitourinary:  She denies pelvic pain, pelvic pressure or changes in her urinary function. She has no hematuria, dysuria, or incontinence. She has no irregular vaginal bleeding or vaginal discharge Musculoskeletal: Denies muscle weakness or joint pains.  Skin:  She has no skin changes, rashes or itching Neurological:  Denies dizziness or headaches. No neuropathy, no numbness or tingling. Psychiatric:  She denies depression or  anxiety. Hematologic/Lymphatic:   No easy bruising or bleeding   Past Medical History  Diagnosis Date  . Hypertension   . Obesity   . Colon cancer [facility])     possible? Patient had surgery in [place]. Denies needing adjuvant therapy. The surgery "removed a tumor from the colon".  . Diabetes mellitus without complication ([place])   . 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 Family history of uterine cancer     Past Surgical History  Procedure Laterality Date  . Fracture surgery  2006    Lt lower arm  . Colon surgery  2010    tumor removal  . Tubal ligation  1993  . Multiple tooth extractions    . Abortions      twice   . Robotic assisted total hysterectomy with bilateral salpingo oopherectomy Bilateral 09/11/2014    Procedure: ROBOTIC ASSISTED TOTAL LAPAROSCOPIC HYSTERECTOMY WITH BILATERAL SALPINGO OOPHORECTOMY LYMPH NODE DISSECTION; LYSIS OF ADHESIONS;  Surgeon: Janie Morning, MD;  Location: [facility];  Service: Gynecology;  Laterality: Bilateral;     Physical Exam: There were no vitals taken for this visit. General: Well dressed, well nourished in no apparent distress.   Skin:  No lesions or rashes. Lungs:  Clear to auscultation bilaterally.  No wheezes. Cardiovascular:  Regular rate and rhythm.  No murmurs or rubs. Abdomen:  Soft, nontender, nondistended.  No palpable masses.  No hepatosplenomegaly.  No ascites. Normal bowel sounds.  No hernias.  Incision is C/D/I no erythema, tenderness or discharge from port sites Genitourinary: Normal EGBUS  Vaginal cuff intact.  No bleeding or discharge.  No cul de sac fullness. Extremities: No cyanosis, clubbing or edema.  No calf tenderness or erythema. No palpable cords. Psychiatric: Mood and affect are appropriate. Neurological: Awake, alert  and oriented x 3. Sensation is intact, no neuropathy.  Musculoskeletal: No pain, normal strength and range of motion.  Assessment:    55 y.o. year old with Stage IA Grade 1 endometrioid  endometrial cancer.   S/p RATLH BSO BPLND 09/11/2014 on . No LVSI, 0% myometrial invasion, neg  lymph nodes. MSH6 and MLH2 loss of nuclear expression, genetic testing MSH2 VUS  Presumed to be pathologic given 2 primary related malignancies  Plan: MSH2 VUS  Urine for cytology collected  Will identify PC

## 2015-10-17 NOTE — Progress Notes (Signed)
This encounter was created in error - please disregard.

## 2015-11-08 ENCOUNTER — Ambulatory Visit: Payer: Self-pay | Attending: Gynecologic Oncology | Admitting: Gynecologic Oncology

## 2015-11-08 ENCOUNTER — Encounter: Payer: Self-pay | Admitting: Gynecologic Oncology

## 2015-11-08 VITALS — BP 140/92 | HR 94 | Temp 98.5°F | Resp 18 | Ht 61.0 in | Wt 163.5 lb

## 2015-11-08 DIAGNOSIS — I10 Essential (primary) hypertension: Secondary | ICD-10-CM | POA: Insufficient documentation

## 2015-11-08 DIAGNOSIS — Z90710 Acquired absence of both cervix and uterus: Secondary | ICD-10-CM | POA: Insufficient documentation

## 2015-11-08 DIAGNOSIS — E669 Obesity, unspecified: Secondary | ICD-10-CM | POA: Insufficient documentation

## 2015-11-08 DIAGNOSIS — Z9079 Acquired absence of other genital organ(s): Secondary | ICD-10-CM | POA: Insufficient documentation

## 2015-11-08 DIAGNOSIS — C541 Malignant neoplasm of endometrium: Secondary | ICD-10-CM | POA: Insufficient documentation

## 2015-11-08 DIAGNOSIS — Z85038 Personal history of other malignant neoplasm of large intestine: Secondary | ICD-10-CM | POA: Insufficient documentation

## 2015-11-08 DIAGNOSIS — E119 Type 2 diabetes mellitus without complications: Secondary | ICD-10-CM | POA: Insufficient documentation

## 2015-11-08 DIAGNOSIS — Z1504 Genetic susceptibility to malignant neoplasm of endometrium: Secondary | ICD-10-CM

## 2015-11-08 DIAGNOSIS — Z90722 Acquired absence of ovaries, bilateral: Secondary | ICD-10-CM | POA: Insufficient documentation

## 2015-11-08 DIAGNOSIS — Z8049 Family history of malignant neoplasm of other genital organs: Secondary | ICD-10-CM | POA: Insufficient documentation

## 2015-11-08 NOTE — Progress Notes (Signed)
GYN ONCOLOGY OFFICE VISIT  CHIEF COMPLAINT:  Endometrial cancer surveillance  HPI:  Laura Douglas is a 55 y.o. year old [id number] initially seen in consultation on 08/13/2014 for grade 1 endometrial cancer.  She then underwent a [place] on 07/03/4172 without complications.  Her postoperative course was uncomplicated.  Her final pathologic diagnosis is a Stage IA Grade 1 endometrioid endometrial cancer with no lymphovascular space invasion, 0 mm of myometrial invasion and negative lymph nodes.  History is notable for colon cancer 2009  Mismatch Repair (MMR) Protein Immunohistochemistry (IHC) on endometrial tumoe IHC Expression Result: MLH1: Preserved nuclear expression (greater 50% tumor expression) MSH2: LOSS OF NUCLEAR EXPRESSION (LESS THAN 5% TUMOR EXPRESSION) MSH6: LOSS OF NUCLEAR EXPRESSION (LESS THAN 5% TUMOR EXPRESSION) PMS2: Preserved nuclear expression (greater 50% tumor expression) * Internal control demonstrates intact nuclear expression  Review of systems: Constitutional:  She has no weight gain or weight loss. She has no fever or chills. Eyes: No blurred vision Ears, Nose, Mouth, Throat: No dizziness, headaches or changes in hearing. No mouth sores. Cardiovascular: No chest pain, palpitations or edema. Respiratory:  No shortness of breath, wheezing or cough Gastrointestinal: She has normal bowel movements without diarrhea or constipation. She denies any nausea or vomiting. She denies blood in her stool or heart burn. Genitourinary:  She denies pelvic pain, pelvic pressure or changes in her urinary function. She has no hematuria, dysuria, or incontinence. She has no irregular vaginal bleeding or vaginal discharge Musculoskeletal: Denies muscle weakness or joint pains.  Skin:  She has no skin changes, rashes or itching Neurological:  Denies dizziness or headaches. No neuropathy, no numbness or tingling. Psychiatric:  She denies depression or  anxiety. Hematologic/Lymphatic:   No easy bruising or bleeding   Past Medical History  Diagnosis Date  . Hypertension   . Obesity   . Colon cancer [facility])     possible? Patient had surgery in [place]. Denies needing adjuvant therapy. The surgery "removed a tumor from the colon".  . Diabetes mellitus without complication ([place])   . 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 Family history of uterine cancer     Past Surgical History  Procedure Laterality Date  . Fracture surgery  2006    Lt lower arm  . Colon surgery  2010    tumor removal  . Tubal ligation  1993  . Multiple tooth extractions    . Abortions      twice   . Robotic assisted total hysterectomy with bilateral salpingo oopherectomy Bilateral 09/11/2014    Procedure: ROBOTIC ASSISTED TOTAL LAPAROSCOPIC HYSTERECTOMY WITH BILATERAL SALPINGO OOPHORECTOMY LYMPH NODE DISSECTION; LYSIS OF ADHESIONS;  Surgeon: Janie Morning, MD;  Location: [facility];  Service: Gynecology;  Laterality: Bilateral;     Physical Exam: There were no vitals taken for this visit. General: Well dressed, well nourished in no apparent distress.   Skin:  No lesions or rashes. Lungs:  Clear to auscultation bilaterally.  No wheezes. Cardiovascular:  Regular rate and rhythm.  No murmurs or rubs. Abdomen:  Soft, nontender, nondistended.  No palpable masses.  No hepatosplenomegaly.  No ascites. Normal bowel sounds.  No hernias.  Incisions are well healed and soft Genitourinary: Normal EGBUS  Vaginal cuff intact.  No bleeding or discharge.  No cul de sac fullness. Extremities: No cyanosis, clubbing or edema.  No calf tenderness or erythema. No palpable cords. Psychiatric: Mood and affect are appropriate. Neurological: Awake, alert and oriented x 3. Sensation  is intact, no neuropathy.  Musculoskeletal: No pain, normal strength and range of motion.  Assessment:    55 y.o. year old with Stage IA Grade 1 endometrioid endometrial cancer.   S/p RATLH BSO  BPLND 09/11/2014 No LVSI, 0% myometrial invasion, neg  lymph nodes. MSH6 and MLH2 loss of nuclear expression, genetic testing MSH2 VUS  Presumed to be pathologic given 2 primary related malignancies  Plan: MSH2 VUS  Urine for cytology scheduled Return for follow-up with Gyn Onc in 6 months.  Donaciano Eva, MD

## 2015-11-08 NOTE — Patient Instructions (Signed)
Plan to come in on May 31 at 4pm to give a urine sample to test for cytology.  Plan to follow up with Dr. Skeet Latch in six months or sooner if needed.  Please call for any questions or concerns.

## 2015-11-13 ENCOUNTER — Other Ambulatory Visit: Payer: Self-pay

## 2015-11-14 NOTE — Progress Notes (Signed)
Orders received from [place] to contact the [facility] to have the patient updated with cytopathology report :  "no cancer cells seen in her urine" . Laura Douglas from our Interpreter services was contacted with the request to contact the patient to update her with the results. Laura Douglas states understanding and will contact the patient.

## 2015-11-15 LAB — CYTOLOGY, URINE

## 2015-11-19 ENCOUNTER — Other Ambulatory Visit: Payer: Self-pay | Admitting: Family Medicine

## 2016-01-17 ENCOUNTER — Encounter (HOSPITAL_COMMUNITY): Payer: Self-pay | Admitting: Emergency Medicine

## 2016-02-17 ENCOUNTER — Other Ambulatory Visit: Payer: Self-pay | Admitting: Family Medicine

## 2016-02-17 DIAGNOSIS — E119 Type 2 diabetes mellitus without complications: Secondary | ICD-10-CM

## 2016-03-12 ENCOUNTER — Emergency Department (HOSPITAL_COMMUNITY)
Admission: EM | Admit: 2016-03-12 | Discharge: 2016-03-12 | Disposition: A | Discharge status: Home / Self Care | Payer: Self-pay | Attending: Emergency Medicine | Admitting: Emergency Medicine

## 2016-03-12 ENCOUNTER — Encounter (HOSPITAL_COMMUNITY): Payer: Self-pay | Admitting: Emergency Medicine

## 2016-03-12 DIAGNOSIS — E1165 Type 2 diabetes mellitus with hyperglycemia: Secondary | ICD-10-CM | POA: Insufficient documentation

## 2016-03-12 DIAGNOSIS — Z85038 Personal history of other malignant neoplasm of large intestine: Secondary | ICD-10-CM | POA: Insufficient documentation

## 2016-03-12 DIAGNOSIS — R739 Hyperglycemia, unspecified: Secondary | ICD-10-CM

## 2016-03-12 DIAGNOSIS — Z8541 Personal history of malignant neoplasm of cervix uteri: Secondary | ICD-10-CM | POA: Insufficient documentation

## 2016-03-12 DIAGNOSIS — Z7984 Long term (current) use of oral hypoglycemic drugs: Secondary | ICD-10-CM | POA: Insufficient documentation

## 2016-03-12 DIAGNOSIS — Z79899 Other long term (current) drug therapy: Secondary | ICD-10-CM | POA: Insufficient documentation

## 2016-03-12 DIAGNOSIS — Z8542 Personal history of malignant neoplasm of other parts of uterus: Secondary | ICD-10-CM | POA: Insufficient documentation

## 2016-03-12 DIAGNOSIS — E119 Type 2 diabetes mellitus without complications: Secondary | ICD-10-CM

## 2016-03-12 DIAGNOSIS — I10 Essential (primary) hypertension: Secondary | ICD-10-CM | POA: Insufficient documentation

## 2016-03-12 LAB — COMPREHENSIVE METABOLIC PANEL
ALT: 48 U/L (ref 14–54)
AST: 37 U/L (ref 15–41)
Albumin: 4.7 g/dL (ref 3.5–5.0)
Alkaline Phosphatase: 68 U/L (ref 38–126)
Anion gap: 9 (ref 5–15)
BUN: 13 mg/dL (ref 6–20)
CHLORIDE: 99 mmol/L — AB (ref 101–111)
CO2: 28 mmol/L (ref 22–32)
CREATININE: 0.64 mg/dL (ref 0.44–1.00)
Calcium: 9.8 mg/dL (ref 8.9–10.3)
GFR calc non Af Amer: >60 mL/min (ref >60)
Glucose, Bld: 247 mg/dL — ABNORMAL HIGH (ref 65–99)
POTASSIUM: 4.2 mmol/L (ref 3.5–5.1)
Sodium: 136 mmol/L (ref 135–145)
Total Bilirubin: 0.7 mg/dL (ref 0.3–1.2)
Total Protein: 7.9 g/dL (ref 6.5–8.1)

## 2016-03-12 LAB — URINALYSIS, ROUTINE W REFLEX MICROSCOPIC
BILIRUBIN URINE: NEGATIVE
Glucose, UA: 100 mg/dL — AB
Hgb urine dipstick: NEGATIVE
Ketones, ur: NEGATIVE mg/dL
Leukocytes, UA: NEGATIVE
Nitrite: NEGATIVE
PH: 6 (ref 5.0–8.0)
Protein, ur: NEGATIVE mg/dL
SPECIFIC GRAVITY, URINE: 1.004 — AB (ref 1.005–1.030)

## 2016-03-12 LAB — CBC WITH DIFFERENTIAL/PLATELET
BASOS ABS: 0 10*3/uL (ref 0.0–0.1)
Basophils Relative: 0 %
EOS ABS: 0.2 10*3/uL (ref 0.0–0.7)
EOS PCT: 2 %
HCT: 41.4 % (ref 36.0–46.0)
Hemoglobin: 13.7 g/dL (ref 12.0–15.0)
Lymphocytes Relative: 43 %
Lymphs Abs: 3.2 10*3/uL (ref 0.7–4.0)
MCH: 28.7 pg (ref 26.0–34.0)
MCHC: 33.1 g/dL (ref 30.0–36.0)
MCV: 86.8 fL (ref 78.0–100.0)
Monocytes Absolute: 0.4 10*3/uL (ref 0.1–1.0)
Monocytes Relative: 6 %
Neutro Abs: 3.7 10*3/uL (ref 1.7–7.7)
Neutrophils Relative %: 49 %
PLATELETS: 206 10*3/uL (ref 150–400)
RBC: 4.77 MIL/uL (ref 3.87–5.11)
RDW: 12.6 % (ref 11.5–15.5)
WBC: 7.5 10*3/uL (ref 4.0–10.5)

## 2016-03-12 LAB — CBG MONITORING, ED: GLUCOSE-CAPILLARY: 310 mg/dL — AB (ref 65–99)

## 2016-03-12 MED ORDER — SODIUM CHLORIDE 0.9 % IV BOLUS (SEPSIS)
1000.0000 mL | Freq: Once | INTRAVENOUS | Status: AC
  Administered 2016-03-12: 1000 mL via INTRAVENOUS

## 2016-03-12 MED ORDER — METFORMIN HCL 500 MG PO TABS: 500.0000 mg | ORAL_TABLET | Freq: Two times a day (BID) | ORAL | 0 refills | Status: DC

## 2016-03-12 NOTE — ED Triage Notes (Signed)
Pt complains of headache, lightheaded, nausea, dizziness, and states she checked her blood sugar this morning and it was 245

## 2016-03-12 NOTE — ED Triage Notes (Signed)
Patient's blood sugar right now is 310

## 2016-03-12 NOTE — ED Notes (Signed)
PA at bedside.

## 2016-03-12 NOTE — ED Provider Notes (Signed)
[facility] DEPT Provider Note   CSN: [id number] Arrival date & time: 03/12/16  2094     History   Chief Complaint Chief Complaint  Patient presents with  . Hyperglycemia    HPI Laura Douglas is a 55 y.o. female.  Laura Douglas is a 55 y.o. Female with a history of diabetes who presents to the emergency department complaining of hyperglycemia. She reports she checked her blood sugar today and it was 245. She reports she has a headache and she's had some increased urinary frequency. Patient has been on metformin for more than 2 years. She reports she takes 500 mg once daily. No recent changes to her medications. No recent illness. Patient denies fevers, coughing, chest pain, shortness of breath, dysuria, vomiting, diarrhea or rashes.   The history is provided by the patient. The history is limited by a language barrier. A language interpreter was used.  Hyperglycemia  Associated symptoms: no abdominal pain, no chest pain, no dysuria, no fever, no nausea, no shortness of breath and no vomiting     Past Medical History:  Diagnosis Date  . Colon cancer [facility])    possible? Patient had surgery in [place]. Denies needing adjuvant therapy. The surgery "removed a tumor from the colon".  . Diabetes mellitus without complication ([place])   . Family history of uterine cancer   . Hypertension   . Obesity   . 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 Pure hypercholesterolemia 06/05/2015  . Overweight 06/03/2015  . Diabetes mellitus without complication ([place]) 70/96/2836  . Back pain 06/03/2015  . GERD (gastroesophageal reflux disease) 06/03/2015  . Genetic testing 10/26/2014  . Uterine cancer ([place])   . Family history of uterine cancer   . 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 Endometrial cancer ([place]) 09/11/2014  . PMB (postmenopausal bleeding) 08/01/2014  . Colon cancer ([place])  08/10/2007    Past Surgical History:  Procedure Laterality Date  . abortions     twice   . arm surgery    . COLON SURGERY    . COLON SURGERY  2010   tumor removal  . FRACTURE SURGERY  2006   Lt lower arm  . MULTIPLE TOOTH EXTRACTIONS    . ROBOTIC ASSISTED TOTAL HYSTERECTOMY WITH BILATERAL SALPINGO OOPHERECTOMY Bilateral 09/11/2014   Procedure: ROBOTIC ASSISTED TOTAL LAPAROSCOPIC HYSTERECTOMY WITH BILATERAL SALPINGO OOPHORECTOMY LYMPH NODE DISSECTION; LYSIS OF ADHESIONS;  Surgeon: Janie Morning, MD;  Location: [facility];  Service: Gynecology;  Laterality: Bilateral;  . TUBAL LIGATION  1993    OB History    Gravida Para Term Preterm AB Living   _0 0 2     SAB TAB Ectopic Multiple Live Births   2 0 0           Home Medications    Prior to Admission medications   Medication Sig Start Date End Date Taking? Authorizing Provider  acetaminophen (TYLENOL) 325 MG tablet Take 650 mg by mouth as needed. Reported on 06/03/2015    Historical Provider, MD  atorvastatin (LIPITOR) 20 MG tablet TAKE ONE TABLET BY MOUTH ONCE DAILY 02/18/16   Boykin Nearing, MD  HYDROcodone-acetaminophen (NORCO/VICODIN) 5-325 MG per tablet Take 1-2 tablets by mouth every 4 (four) hours as needed. 08/10/13   Charlann Lange, PA-C  lisinopril (PRINIVIL,ZESTRIL) 2.5 MG tablet Take 1 tablet (2.5 mg total) by mouth daily. 02/18/16   Boykin Nearing, MD  metFORMIN (GLUCOPHAGE) 500 MG tablet Take 1 tablet (500 mg total) by mouth 2 (two) times daily with a meal. 03/12/16   Waynetta Pean, PA-C  naproxen (NAPROSYN) 500 MG tablet Take 1 tablet (500 mg total) by mouth 2 (two) times daily with a meal. 06/03/15   Arnoldo Morale, MD  OVER THE COUNTER MEDICATION Take 1 tablet by mouth 2 (two) times daily. Pt takes one in the morning and one at night for menopause symptoms    Historical Provider, MD  OVER THE COUNTER MEDICATION Take 1 tablet by mouth 2 (two) times daily. Pt takes for cold symptoms    Historical Provider, MD    sulfamethoxazole-trimethoprim (SEPTRA DS) 800-160 MG per tablet Take 1 tablet by mouth every 12 (twelve) hours. 08/10/13   Charlann Lange, PA-C    Family History Family History  Problem Relation Age of Onset  . Prostate cancer Father   . Diabetes Mother   . Uterine cancer Sister 23  . Uterine cancer Maternal Aunt     Social History Social History  Substance Use Topics  . Smoking status: Never Smoker  . Smokeless tobacco: Never Used  . Alcohol use No     Comment: occasionally has beer     Allergies   Review of patient's allergies indicates no known allergies.   Review of Systems Review of Systems  Constitutional: Negative for chills and fever.  HENT: Negative for congestion and sore throat.   Eyes: Negative for visual disturbance.  Respiratory: Negative for cough and shortness of breath.   Cardiovascular: Negative for chest pain.  Gastrointestinal: Negative for abdominal pain, diarrhea, nausea and vomiting.  Genitourinary: Positive for frequency. Negative for dysuria, hematuria and urgency.  Musculoskeletal: Negative for back pain and neck pain.  Skin: Negative for rash.  Neurological: Positive for headaches. Negative for syncope and light-headedness.     Physical Exam Updated Vital Signs BP 124/71 (BP Location: Left Arm)   Pulse 84   Temp 98 F (36.7 C) (Oral)   Resp 18   Ht 5' 2.99" (1.6 m)   Wt 72.6 kg   SpO2 100%   BMI 28.35 kg/m   Physical Exam  Constitutional: She is oriented to person, place, and time. She appears well-developed and well-nourished. No distress.  Nontoxic appearing.  HENT:  Head: Normocephalic and atraumatic.  Mouth/Throat: Oropharynx is clear and moist.  Mucous membranes are moist.  Eyes: Conjunctivae are normal. Pupils are equal, round, and reactive to light. Right eye exhibits no discharge. Left eye exhibits no discharge.  Neck: Normal range of motion. Neck supple.  Cardiovascular: Normal rate, regular rhythm, normal heart sounds  and intact distal pulses.   Pulmonary/Chest: Effort normal and breath sounds normal. No respiratory distress.  Abdominal: Soft. Bowel sounds are normal. There is no tenderness. There is no guarding.  Abdomen is soft and nontender to palpation.  Lymphadenopathy:    She has no cervical adenopathy.  Neurological: She is alert and oriented to person, place, and time. No cranial nerve deficit. Coordination normal.  Skin: Skin is warm and dry. Capillary refill takes less than 2 seconds. No rash noted. She is not diaphoretic. No erythema. No pallor.  Psychiatric: She has a normal mood and affect. Her behavior is normal.  Nursing note and vitals reviewed.    ED Treatments / Results  Labs (all labs ordered are listed, but only abnormal results are displayed) Labs Reviewed  COMPREHENSIVE METABOLIC PANEL - Abnormal; Notable for the following:  Result Value   Chloride 99 (*)    Glucose, Bld 247 (*)    All other components within normal limits  URINALYSIS, ROUTINE W REFLEX MICROSCOPIC (NOT AT [facility]) - Abnormal; Notable for the following:    Specific Gravity, Urine 1.004 (*)    Glucose, UA 100 (*)    All other components within normal limits  CBG MONITORING, ED - Abnormal; Notable for the following:    Glucose-Capillary 310 (*)    All other components within normal limits  CBC WITH DIFFERENTIAL/PLATELET    EKG  EKG Interpretation None       Radiology No results found.  Procedures Procedures (including critical care time)  Medications Ordered in ED Medications  sodium chloride 0.9 % bolus 1,000 mL (0 mLs Intravenous Stopped 03/12/16 1507)     Initial Impression / Assessment and Plan / ED Course  I have reviewed the triage vital signs and the nursing notes.  Pertinent labs & imaging results that were available during my care of the patient were reviewed by me and considered in my medical decision making (see chart for details).  Clinical Course   Patient with a history of  diabetes presented to the emergency department complaining of hyperglycemia and the headache. Patient reports her blood sugar at home was over 200. She has been taking metformin 500 mg once daily for many years. No recent changes to her medications. No recent illness. On exam the patient is afebrile and nontoxic appearing. Fingerstick glucose is 310. CMP reveals a glucose of 247 with a normal anion gap. CMP is otherwise unremarkable. CBC is within normal limits. Urinalysis shows only trace glucose. Patient received fluid bolus during her stay. Will discharge the patient and have her take metformin twice daily until she follows up with her primary care doctor. Provided her with a refill for her metformin. I discussed return precautions. I provided her with diet instructions to help with diabetes. Prior to discharge the patient reports she is feeling better. I advised the patient to follow-up with their primary care provider this week. I advised the patient to return to the emergency department with new or worsening symptoms or new concerns. The patient verbalized understanding and agreement with plan.      Final Clinical Impressions(s) / ED Diagnoses   Final diagnoses:  Hyperglycemia    New Prescriptions Discharge Medication List as of 03/12/2016  3:06 PM       Waynetta Pean, PA-C 03/12/16 1540    Carmin Muskrat, MD 03/12/16 1739

## 2016-03-12 NOTE — ED Notes (Signed)
EDPA at bedside. PA INTERPRETER PRESENT

## 2016-04-27 ENCOUNTER — Other Ambulatory Visit: Payer: Self-pay | Admitting: Family Medicine

## 2016-05-11 ENCOUNTER — Other Ambulatory Visit: Payer: Self-pay | Admitting: Obstetrics and Gynecology

## 2016-05-11 DIAGNOSIS — Z1231 Encounter for screening mammogram for malignant neoplasm of breast: Secondary | ICD-10-CM

## 2016-05-12 NOTE — Progress Notes (Deleted)
GYN ONCOLOGY OFFICE VISIT  CHIEF COMPLAINT:  Endometrial cancer surveillance  HPI:  Laura Douglas is a 55 y.o. year old [id number] initially seen in consultation on 08/13/2014 for grade 1 endometrial cancer.  She then underwent a [place] on 54/19/4174 without complications.  Her postoperative course was uncomplicated.  Her final pathologic diagnosis is a Stage IA Grade 1 endometrioid endometrial cancer with no lymphovascular space invasion, 0 mm of myometrial invasion and negative lymph nodes.  History is notable for colon cancer 2009  Mismatch Repair (MMR) Protein Immunohistochemistry (IHC) on endometrial tumoe IHC Expression Result: MLH1: Preserved nuclear expression (greater 50% tumor expression) MSH2: LOSS OF NUCLEAR EXPRESSION (LESS THAN 5% TUMOR EXPRESSION) MSH6: LOSS OF NUCLEAR EXPRESSION (LESS THAN 5% TUMOR EXPRESSION) PMS2: Preserved nuclear expression (greater 50% tumor expression) * Internal control demonstrates intact nuclear expression  Review of systems: Constitutional:  She has no weight gain or weight loss. She has no fever or chills. Eyes: No blurred vision Ears, Nose, Mouth, Throat: No dizziness, headaches or changes in hearing. No mouth sores. Cardiovascular: No chest pain, palpitations or edema. Respiratory:  No shortness of breath, wheezing or cough Gastrointestinal: She has normal bowel movements without diarrhea or constipation. She denies any nausea or vomiting. She denies blood in her stool or heart burn. Genitourinary:  She denies pelvic pain, pelvic pressure or changes in her urinary function. She has no hematuria, dysuria, or incontinence. She has no irregular vaginal bleeding or vaginal discharge Musculoskeletal: Denies muscle weakness or joint pains.  Skin:  She has no skin changes, rashes or itching Neurological:  Denies dizziness or headaches. No neuropathy, no numbness or tingling. Psychiatric:  She denies depression or  anxiety. Hematologic/Lymphatic:   No easy bruising or bleeding   Past Medical History  Diagnosis Date  . Hypertension   . Obesity   . Colon cancer [facility])     possible? Patient had surgery in [place]. Denies needing adjuvant therapy. The surgery "removed a tumor from the colon".  . Diabetes mellitus without complication ([place])   . 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 Family history of uterine cancer     Past Surgical History  Procedure Laterality Date  . Fracture surgery  2006    Lt lower arm  . Colon surgery  2010    tumor removal  . Tubal ligation  1993  . Multiple tooth extractions    . Abortions      twice   . Robotic assisted total hysterectomy with bilateral salpingo oopherectomy Bilateral 09/11/2014    Procedure: ROBOTIC ASSISTED TOTAL LAPAROSCOPIC HYSTERECTOMY WITH BILATERAL SALPINGO OOPHORECTOMY LYMPH NODE DISSECTION; LYSIS OF ADHESIONS;  Surgeon: Janie Morning, MD;  Location: [facility];  Service: Gynecology;  Laterality: Bilateral;     Physical Exam: There were no vitals taken for this visit. General: Well dressed, well nourished in no apparent distress.   Skin:  No lesions or rashes. Lungs:  Clear to auscultation bilaterally.  No wheezes. Cardiovascular:  Regular rate and rhythm.  No murmurs or rubs. Abdomen:  Soft, nontender, nondistended.  No palpable masses.  No hepatosplenomegaly.  No ascites. Normal bowel sounds.  No hernias.  Incisions are well healed and soft Genitourinary: Normal EGBUS  Vaginal cuff intact.  No bleeding or discharge.  No cul de sac fullness. Extremities: No cyanosis, clubbing or edema.  No calf tenderness or erythema. No palpable cords. Psychiatric: Mood and affect are appropriate. Neurological: Awake, alert and oriented x 3. Sensation  is intact, no neuropathy.  Musculoskeletal: No pain, normal strength and range of motion.  Assessment:   55 y.o. with Stage IA Grade 1 endometrioid endometrial cancer.   S/p RATLH BSO BPLND  09/11/2014 No LVSI, 0% myometrial invasion, neg  lymph nodes. MSH6 and MLH2 loss of nuclear expression, genetic testing MSH2 VUS  Presumed to be pathologic given 2 primary related malignancies  Plan: MSH2 VUS  Urine for cytology scheduled Return for follow-up with Gyn Onc in 6 months. Pap collected

## 2016-05-14 ENCOUNTER — Ambulatory Visit: Payer: Self-pay | Admitting: Gynecologic Oncology

## 2016-06-11 ENCOUNTER — Encounter (HOSPITAL_COMMUNITY): Payer: Self-pay

## 2016-06-11 ENCOUNTER — Ambulatory Visit (HOSPITAL_COMMUNITY)
Admission: RE | Admit: 2016-06-11 | Discharge: 2016-06-11 | Disposition: A | Discharge status: Home / Self Care | Payer: Self-pay | Source: Ambulatory Visit | Attending: Obstetrics and Gynecology | Admitting: Obstetrics and Gynecology

## 2016-06-11 ENCOUNTER — Ambulatory Visit
Admission: RE | Admit: 2016-06-11 | Discharge: 2016-06-11 | Disposition: A | Discharge status: Home / Self Care | Payer: No Typology Code available for payment source | Source: Ambulatory Visit | Attending: Obstetrics and Gynecology | Admitting: Obstetrics and Gynecology

## 2016-06-11 VITALS — BP 110/68 | Temp 98.4°F | Ht 60.5 in | Wt 160.4 lb

## 2016-06-11 DIAGNOSIS — Z1231 Encounter for screening mammogram for malignant neoplasm of breast: Secondary | ICD-10-CM

## 2016-06-11 DIAGNOSIS — Z1239 Encounter for other screening for malignant neoplasm of breast: Secondary | ICD-10-CM

## 2016-06-11 NOTE — Patient Instructions (Signed)
Explained breast self awareness to Laura Douglas. Patient did not need a Pap smear today due to last Pap smear was 6 months ago per patient. Let patient know that her next Pap smear is due in 6 months due to her history of endometrial cancer unless directed differently from her Physician following her. Referred patient to the [facility] for a screening mammogram. Appointment scheduled for Thursday, June 11, 2016 at 1210. Let patient know the [facility] will follow up with her within the next couple weeks with results of mammogram by letter or phone. Jearld Adjutant Galeano-Ramirez verbalized understanding.  Devin Foskey, Arvil Chaco, RN 1:47 PM

## 2016-06-11 NOTE — Progress Notes (Signed)
No complaints today.   Pap Smear: Pap smear not completed today. Last Pap smear was 6 months ago and normal per patient. Per patient has no history of an abnormal Pap smear. Patient has a history of a hysterectomy 09/11/2014 due to endometrial cancer. Patient needs to continue yearly Pap smears due to her history of a hysterectomy due to endometrial cancer. No Pap smear results are in EPIC.  Physical exam: Breasts Breasts symmetrical. No skin abnormalities bilateral breasts. No nipple retraction bilateral breasts. No nipple discharge bilateral breasts. No lymphadenopathy. No lumps palpated bilateral breasts. No complaints of pain or tenderness on exam. Referred patient to the [facility] for a screening mammogram. Appointment scheduled for Thursday, June 11, 2016 at 1210.        Pelvic/Bimanual No Pap smear completed today since last Pap smear was 6 months ago per patient. Pap smear not indicated per BCCCP guidelines.   Smoking History: Patient has never smoked.  Patient Navigation: Patient education provided. Access to services provided for patient through [facility] program. Spanish interpreter provided.  Colorectal Cancer Screening: Per patient had a colonoscopy completed 20 years ago. No complaints today.  Used Spanish interpreter [employer] from [place].

## 2016-06-19 ENCOUNTER — Encounter (HOSPITAL_COMMUNITY): Payer: Self-pay | Admitting: *Deleted

## 2016-08-17 ENCOUNTER — Ambulatory Visit (INDEPENDENT_AMBULATORY_CARE_PROVIDER_SITE_OTHER): Payer: Self-pay | Admitting: Internal Medicine

## 2016-08-17 ENCOUNTER — Encounter: Payer: Self-pay | Admitting: Internal Medicine

## 2016-08-17 VITALS — BP 130/82 | HR 82 | Resp 12 | Ht 60.0 in | Wt 156.0 lb

## 2016-08-17 DIAGNOSIS — E1159 Type 2 diabetes mellitus with other circulatory complications: Secondary | ICD-10-CM | POA: Insufficient documentation

## 2016-08-17 DIAGNOSIS — E663 Overweight: Secondary | ICD-10-CM

## 2016-08-17 DIAGNOSIS — E7800 Pure hypercholesterolemia, unspecified: Secondary | ICD-10-CM

## 2016-08-17 DIAGNOSIS — I10 Essential (primary) hypertension: Secondary | ICD-10-CM

## 2016-08-17 DIAGNOSIS — E119 Type 2 diabetes mellitus without complications: Secondary | ICD-10-CM

## 2016-08-17 LAB — GLUCOSE, POCT (MANUAL RESULT ENTRY): POC GLUCOSE: 249 mg/dL — AB (ref 70–99)

## 2016-08-17 MED ORDER — METFORMIN HCL 500 MG PO TABS: 500.0000 mg | ORAL_TABLET | Freq: Two times a day (BID) | ORAL | 11 refills | Status: DC

## 2016-08-17 MED ORDER — AGAMATRIX PRESTO W/DEVICE KIT: PACK | 0 refills | Status: AC

## 2016-08-17 MED ORDER — LISINOPRIL 2.5 MG PO TABS: 2.5000 mg | ORAL_TABLET | Freq: Every day | ORAL | 11 refills | Status: DC

## 2016-08-17 MED ORDER — GLUCOSE BLOOD VI STRP: ORAL_STRIP | 12 refills | Status: DC

## 2016-08-17 MED ORDER — ATORVASTATIN CALCIUM 20 MG PO TABS: 20.0000 mg | ORAL_TABLET | Freq: Every day | ORAL | 11 refills | Status: DC

## 2016-08-17 NOTE — Progress Notes (Signed)
Subjective:    Patient ID: Laura Douglas, female    DOB: March 21, 1961, 56 y.o.   MRN: [id number]  HPI   Here to establish Her friend, Basilia Jumbo, interprets over the phone. Exceedingly difficult history today.  Patient at times not engaged and difficulty with interpretation at times.  1.  Essential Hypertension:  Diagnosed 4 years ago.  Was controlled on medication, Lisinopril 2.5 mg daily.  Urine microalbumin in 2106 was negative.  2.  DM Type 2:  Diagnosed 4-5 years ago.    A1C appears to have last been checked on 06/03/2015 at a level of 7.7%.  States her sugars are currently running in 250 range.  Not clear if she has been getting her Metformin regularly.  Sounds like she is at least stretching out her presciptions, but this is not clear.  Sounds like she is also currently out of her medications. Does have a glucometer and test strips, she is not clear as to what brand.   3.  Hyperlipidemia:  Was on Atorvastatin 20 mg daily, but has been off for an unknown period of time.  4.  Obesity:  Discussed this is increasing her difficulties with DM and hypertension.    5.  Patient denies colon cancer or endometrial cancer in herself.  Her father has had prostate and colon cancer and her sister has had cervical cancer, but she denies having either.  Later, states she has had the cancers.   Current Meds  Medication Sig  . acetaminophen (TYLENOL) 325 MG tablet Take 650 mg by mouth as needed. Reported on 06/03/2015  . lisinopril (PRINIVIL,ZESTRIL) 2.5 MG tablet Take 1 tablet (2.5 mg total) by mouth daily.  . metFORMIN (GLUCOPHAGE) 500 MG tablet Take 1 tablet (500 mg total) by mouth 2 (two) times daily with a meal.    No Known Allergies   Past Medical History:  Diagnosis Date  . Colon cancer [facility])    possible? Patient had surgery in [place]. Denies needing adjuvant therapy. The surgery "removed a tumor from the colon".  . Diabetes mellitus without complication ([place])   . Family history  of uterine cancer   . Hypertension   . Obesity   . Uterine cancer ([place]) 2016   MSH2/MSH6 LOH on IHC   Past Surgical History:  Procedure Laterality Date  . ABDOMINAL HYSTERECTOMY    . abortions     twice   . arm surgery    . COLON SURGERY    . COLON SURGERY  2010   tumor removal  . FRACTURE SURGERY  2006   Lt lower arm  . MULTIPLE TOOTH EXTRACTIONS    . ROBOTIC ASSISTED TOTAL HYSTERECTOMY WITH BILATERAL SALPINGO OOPHERECTOMY Bilateral 09/11/2014   Procedure: ROBOTIC ASSISTED TOTAL LAPAROSCOPIC HYSTERECTOMY WITH BILATERAL SALPINGO OOPHORECTOMY LYMPH NODE DISSECTION; LYSIS OF ADHESIONS;  Surgeon: Janie Morning, MD;  Location: [facility];  Service: Gynecology;  Laterality: Bilateral;  . TUBAL LIGATION  1993   Family History  Problem Relation Age of Onset  . Prostate cancer Father   . Diabetes Mother   . Uterine cancer Sister 25  . Uterine cancer Maternal Aunt    Social History   Social History  . Marital status: Single    Spouse name: N/A  . Number of children: 4  . Years of education: N/A   Occupational History  . Not on file.   Social History Main Topics  . Smoking status: Never Smoker  . Smokeless tobacco: Never Used  . Alcohol use No  Comment: occasionally has beer  . Drug use: No  . Sexual activity: Not Currently    Birth control/ protection: Surgical   Other Topics Concern  . Not on file   Social History Narrative   ** Merged History Encounter **         Review of Systems     Objective:   Physical Exam  HEENT:  Dental Decay Lungs:  CTA CV:  RRR with normal S1 and S2, No S3, S4 or murmur.  Radial and DP pulses normal and equal. Abd:  S, NT, No HSM or mass. LE:  No edema.        Assessment & Plan:  1.  DM:  Glucometer and test strips as well as refills of Metformin 500 mg twice daily written.  To keep track of sugars before meals twice daily and return for fasting labs in 3 months, including urine microalbumin/crea and A1C, CMP. To work on  diet and gradually increasing physical activity.  2.  Essential Hypertension:  Rx for Lisinopril 2.5 mg daily.  Labs in 3 months.  3.  Hyperlipidemia:  To restart Atorvastatin 20 mg daily.  FLP in 3 months.  4.  History of endometrial cancer:  Confirmed after checking her chart.  Followed by [facility].  5.  ?History of Colon Cancer in 2009?  Not clear from chart if cancer.  Will need to discuss colonoscopy at next visit.  6.  Obesity:  As in #1.

## 2016-08-31 NOTE — Progress Notes (Signed)
GYN ONCOLOGY OFFICE VISIT  CHIEF COMPLAINT:  Endometrial cancer surveillance, pelvic pain  HPI:  Laura Douglas is a 56 y.o. [id number] initially seen in consultation on 08/13/2014 for grade 1 endometrial cancer.  She then underwent a [place] on 0/01/6760 without complications.  Her postoperative course was uncomplicated.  Her final pathologic diagnosis is a Stage IA Grade 1 endometrioid endometrial cancer with no lymphovascular space invasion, 0 mm of myometrial invasion and negative lymph nodes.  History is notable for colon cancer 2009 MSH6 and MLH2 loss of nuclear expression, genetic testing MSH2 VUS  Presumed to be pathologic given 2 primary related malignancies  Reports new pelvic pain and dyspareunia with intercourse over the last 2 months. The pain is 4 out of 10 aching without radiation. Change in position does not affect discomfort. Denies vaginal bleeding changes in rectal or bowel habits change in appetite or weight loss  Review of systems: Constitutional:   She has no fever or chills. Eyes: No blurred vision Ears, Nose, Mouth, Throat: No dizziness, headaches or changes in hearing. No mouth sores. Cardiovascular: No chest pain, palpitations or edema. Respiratory:  No shortness of breath, wheezing or cough Gastrointestinal: She has normal bowel movements without diarrhea or constipation. She denies any nausea or vomiting. She denies blood in her stool or heart burn. Genitourinary:  She reports pelvic pain with intercourse achy and without radiation, no pelvic pressure or changes in her urinary function. She has no hematuria, dysuria, or incontinence. She has no irregular vaginal bleeding or vaginal discharge Musculoskeletal: Denies muscle weakness or joint pains.  Skin:  She has no skin changes, rashes or itching Neurological:  Denies dizziness or headaches. No neuropathy, no numbness or tingling. Psychiatric:  She denies depression or anxiety. Hematologic/Lymphatic:   No  easy bruising or bleeding   Past Medical History  Diagnosis Date  . Hypertension   . Obesity   . Colon cancer [facility])     possible? Patient had surgery in [place]. Denies needing adjuvant therapy. The surgery "removed a tumor from the colon".  . Diabetes mellitus without complication ([place])   . Uterine cancer ([place]) 2016    MSH2/MSH6 LOH on IHC  . Family history of uterine cancer     Past Surgical History  Procedure Laterality Date  . Fracture surgery  2006    Lt lower arm  . Colon surgery  2010    tumor removal  . Tubal ligation  1993  . Multiple tooth extractions    . Abortions      twice   . Robotic assisted total hysterectomy with bilateral salpingo oopherectomy Bilateral 09/11/2014    Procedure: ROBOTIC ASSISTED TOTAL LAPAROSCOPIC HYSTERECTOMY WITH BILATERAL SALPINGO OOPHORECTOMY LYMPH NODE DISSECTION; LYSIS OF ADHESIONS;  Surgeon: Janie Morning, MD;  Location: [facility];  Service: Gynecology;  Laterality: Bilateral;     Physical Exam: BP 130/83 (BP Location: Left Arm, Patient Position: Sitting)   Pulse 91   Temp 98.6 F (37 C) (Oral)   Resp 20   Wt 156 lb 8 oz (71 kg)   BMI 30.56 kg/m   General: Well dressed, well nourished in no apparent distress.   Skin:  No lesions or rashes. Lungs:  Clear to auscultation bilaterally.  No wheezes. Cardiovascular:  Regular rate and rhythm.  No murmurs or rubs. Abdomen:  Soft, nontender, nondistended.  No palpable masses.  No hepatosplenomegaly.  No ascites. Normal bowel sounds.  No hernias.  Incisions are well healed and soft Genitourinary: Normal EGBUS  Vaginal cuff intact.  No bleeding or discharge. Approximate 1 cm area of thickening in the mid vaginal cuff without any visual changes mildly tender to touch.  No cul de sac fullness. Extremities: No cyanosis, clubbing or edema.  No calf tenderness or erythema. No palpable cords. Psychiatric: Mood and affect are appropriate. Neurological: Awake, alert and oriented x 3. Sensation is  intact, no neuropathy.  Musculoskeletal: No pain, normal strength and range of motion.  Assessment:   56 y.o. with Stage IA Grade 1 endometrioid endometrial cancer.   S/p RATLH BSO BPLND 09/11/2014 No LVSI, 0% myometrial invasion, neg  lymph nodes. MSH6 and MLH2 loss of nuclear expression, genetic testing MSH2 VUS  Presumed to be pathologic given 2 primary related malignancies  Plan: MSH2 VUS  Urine for cytology scheduled Return for follow-up with Gyn Onc in 6 months.  Pelvic pain Reports 4 out of 10 aching pain with dyspareunia. Pelvic examination thickening noted in the mid vaginal apex that was slightly tender to palpation. CT abdomen and pelvis to assess for recurrent disease

## 2016-09-01 ENCOUNTER — Encounter: Payer: Self-pay | Admitting: Gynecologic Oncology

## 2016-09-01 ENCOUNTER — Ambulatory Visit: Payer: Self-pay | Attending: Gynecologic Oncology | Admitting: Gynecologic Oncology

## 2016-09-01 ENCOUNTER — Ambulatory Visit (HOSPITAL_BASED_OUTPATIENT_CLINIC_OR_DEPARTMENT_OTHER): Payer: Self-pay

## 2016-09-01 ENCOUNTER — Other Ambulatory Visit (HOSPITAL_COMMUNITY)
Admission: RE | Admit: 2016-09-01 | Discharge: 2016-09-01 | Disposition: A | Discharge status: Home / Self Care | Payer: No Typology Code available for payment source | Source: Ambulatory Visit | Attending: Gynecologic Oncology | Admitting: Gynecologic Oncology

## 2016-09-01 VITALS — BP 130/83 | HR 91 | Temp 98.6°F | Resp 20 | Wt 156.5 lb

## 2016-09-01 DIAGNOSIS — Z8542 Personal history of malignant neoplasm of other parts of uterus: Secondary | ICD-10-CM | POA: Insufficient documentation

## 2016-09-01 DIAGNOSIS — I10 Essential (primary) hypertension: Secondary | ICD-10-CM | POA: Insufficient documentation

## 2016-09-01 DIAGNOSIS — R102 Pelvic and perineal pain: Secondary | ICD-10-CM | POA: Insufficient documentation

## 2016-09-01 DIAGNOSIS — Z1504 Genetic susceptibility to malignant neoplasm of endometrium: Secondary | ICD-10-CM

## 2016-09-01 DIAGNOSIS — Z9889 Other specified postprocedural states: Secondary | ICD-10-CM | POA: Insufficient documentation

## 2016-09-01 DIAGNOSIS — Z90710 Acquired absence of both cervix and uterus: Secondary | ICD-10-CM | POA: Insufficient documentation

## 2016-09-01 DIAGNOSIS — M25559 Pain in unspecified hip: Secondary | ICD-10-CM

## 2016-09-01 DIAGNOSIS — E119 Type 2 diabetes mellitus without complications: Secondary | ICD-10-CM | POA: Insufficient documentation

## 2016-09-01 DIAGNOSIS — C541 Malignant neoplasm of endometrium: Secondary | ICD-10-CM

## 2016-09-01 DIAGNOSIS — E669 Obesity, unspecified: Secondary | ICD-10-CM | POA: Insufficient documentation

## 2016-09-01 DIAGNOSIS — Z85038 Personal history of other malignant neoplasm of large intestine: Secondary | ICD-10-CM | POA: Insufficient documentation

## 2016-09-01 DIAGNOSIS — N941 Unspecified dyspareunia: Secondary | ICD-10-CM | POA: Insufficient documentation

## 2016-09-01 LAB — URINALYSIS, MICROSCOPIC - CHCC
BLOOD: NEGATIVE
Bilirubin (Urine): NEGATIVE
GLUCOSE UR CHCC: 100 mg/dL
Ketones: NEGATIVE mg/dL
Leukocyte Esterase: NEGATIVE
Nitrite: NEGATIVE
PH: 6 (ref 4.6–8.0)
PROTEIN: NEGATIVE mg/dL
RBC / HPF: NEGATIVE (ref 0–2)
SPECIFIC GRAVITY, URINE: 1.005 (ref 1.003–1.035)
UROBILINOGEN UR: 0.2 mg/dL (ref 0.2–1)
WBC UA: NEGATIVE (ref 0–?)

## 2016-09-01 NOTE — Patient Instructions (Addendum)
Dr. Skeet Latch wants to see you May 10,2018 at [place]. Please arrive at 8 am to register.  Central scheduling will call you with the appointment for the CT scan. Of your abdomen and pelvis. Dr. Skeet Latch will call you with the results. Will get a urine specimen today for Dr. Skeet Latch to review.

## 2016-09-04 LAB — CYTOLOGY, URINE

## 2016-09-08 ENCOUNTER — Telehealth: Payer: Self-pay

## 2016-09-08 NOTE — Telephone Encounter (Signed)
ENCOUNTER OPENED IN ERROR

## 2016-09-09 ENCOUNTER — Encounter (HOSPITAL_COMMUNITY): Payer: Self-pay

## 2016-09-09 ENCOUNTER — Ambulatory Visit (HOSPITAL_COMMUNITY)
Admission: RE | Admit: 2016-09-09 | Discharge: 2016-09-09 | Disposition: A | Discharge status: Home / Self Care | Payer: No Typology Code available for payment source | Source: Ambulatory Visit | Attending: Gynecologic Oncology | Admitting: Gynecologic Oncology

## 2016-09-09 ENCOUNTER — Other Ambulatory Visit: Payer: Self-pay

## 2016-09-09 DIAGNOSIS — I700 Atherosclerosis of aorta: Secondary | ICD-10-CM | POA: Insufficient documentation

## 2016-09-09 DIAGNOSIS — N941 Unspecified dyspareunia: Secondary | ICD-10-CM | POA: Insufficient documentation

## 2016-09-09 DIAGNOSIS — Z1504 Genetic susceptibility to malignant neoplasm of endometrium: Secondary | ICD-10-CM | POA: Insufficient documentation

## 2016-09-09 DIAGNOSIS — C541 Malignant neoplasm of endometrium: Secondary | ICD-10-CM | POA: Insufficient documentation

## 2016-09-09 DIAGNOSIS — K760 Fatty (change of) liver, not elsewhere classified: Secondary | ICD-10-CM | POA: Insufficient documentation

## 2016-09-09 DIAGNOSIS — M25559 Pain in unspecified hip: Secondary | ICD-10-CM

## 2016-09-09 LAB — POCT I-STAT CREATININE: Creatinine, Ser: 0.7 mg/dL (ref 0.44–1.00)

## 2016-09-09 MED ORDER — IOPAMIDOL (ISOVUE-300) INJECTION 61%
100.0000 mL | Freq: Once | INTRAVENOUS | Status: AC | PRN
  Administered 2016-09-09: 100 mL via INTRAVENOUS

## 2016-09-09 MED ORDER — IOPAMIDOL (ISOVUE-300) INJECTION 61%
INTRAVENOUS | Status: DC
Start: 2016-09-09 — End: 2016-09-10
  Filled 2016-09-09: qty 100

## 2016-10-16 ENCOUNTER — Telehealth: Payer: Self-pay

## 2016-10-16 NOTE — Telephone Encounter (Signed)
Told Laura Douglas that her CT Scan looked good. No evidence of cancer per Joylene John, NP. Pt states that she is not having any abdominal pain nor pain with intercourse as noted at 09-01-16 visit. Pt can r/s 10-22-16 appointment to 03-11-17 since she is feeling good per Joylene John, NP.   Pt r/s to 03-11-17.

## 2016-10-22 ENCOUNTER — Ambulatory Visit: Payer: Self-pay | Admitting: Gynecologic Oncology

## 2016-11-17 ENCOUNTER — Other Ambulatory Visit (INDEPENDENT_AMBULATORY_CARE_PROVIDER_SITE_OTHER): Payer: Self-pay

## 2016-11-17 DIAGNOSIS — Z79899 Other long term (current) drug therapy: Secondary | ICD-10-CM

## 2016-11-17 DIAGNOSIS — E7800 Pure hypercholesterolemia, unspecified: Secondary | ICD-10-CM

## 2016-11-17 DIAGNOSIS — E119 Type 2 diabetes mellitus without complications: Secondary | ICD-10-CM

## 2016-11-18 LAB — COMPREHENSIVE METABOLIC PANEL
A/G RATIO: 1.7 (ref 1.2–2.2)
ALT: 37 IU/L — AB (ref 0–32)
AST: 33 IU/L (ref 0–40)
Albumin: 4.8 g/dL (ref 3.5–5.5)
Alkaline Phosphatase: 75 IU/L (ref 39–117)
BUN/Creatinine Ratio: 16 (ref 9–23)
BUN: 12 mg/dL (ref 6–24)
Bilirubin Total: 0.5 mg/dL (ref 0.0–1.2)
CALCIUM: 9.9 mg/dL (ref 8.7–10.2)
CO2: 19 mmol/L (ref 18–29)
Chloride: 99 mmol/L (ref 96–106)
Creatinine, Ser: 0.75 mg/dL (ref 0.57–1.00)
GFR calc Af Amer: 105 mL/min/{1.73_m2} (ref >59)
GFR, EST NON AFRICAN AMERICAN: 91 mL/min/{1.73_m2} (ref >59)
GLUCOSE: 158 mg/dL — AB (ref 65–99)
Globulin, Total: 2.8 g/dL (ref 1.5–4.5)
POTASSIUM: 4.4 mmol/L (ref 3.5–5.2)
Sodium: 141 mmol/L (ref 134–144)
TOTAL PROTEIN: 7.6 g/dL (ref 6.0–8.5)

## 2016-11-18 LAB — CBC WITH DIFFERENTIAL/PLATELET
BASOS ABS: 0 10*3/uL (ref 0.0–0.2)
Basos: 0 %
EOS (ABSOLUTE): 0.2 10*3/uL (ref 0.0–0.4)
Eos: 3 %
HEMOGLOBIN: 13.7 g/dL (ref 11.1–15.9)
Hematocrit: 42.8 % (ref 34.0–46.6)
IMMATURE GRANS (ABS): 0 10*3/uL (ref 0.0–0.1)
IMMATURE GRANULOCYTES: 0 %
LYMPHS: 42 %
Lymphocytes Absolute: 2.7 10*3/uL (ref 0.7–3.1)
MCH: 27.6 pg (ref 26.6–33.0)
MCHC: 32 g/dL (ref 31.5–35.7)
MCV: 86 fL (ref 79–97)
MONOCYTES: 6 %
Monocytes Absolute: 0.4 10*3/uL (ref 0.1–0.9)
NEUTROS PCT: 49 %
Neutrophils Absolute: 3.2 10*3/uL (ref 1.4–7.0)
PLATELETS: 237 10*3/uL (ref 150–379)
RBC: 4.96 x10E6/uL (ref 3.77–5.28)
RDW: 13.1 % (ref 12.3–15.4)
WBC: 6.4 10*3/uL (ref 3.4–10.8)

## 2016-11-18 LAB — MICROALBUMIN / CREATININE URINE RATIO
Creatinine, Urine: 144.4 mg/dL
MICROALB/CREAT RATIO: 4.4 mg/g{creat} (ref 0.0–30.0)
MICROALBUM., U, RANDOM: 6.4 ug/mL

## 2016-11-18 LAB — LIPID PANEL W/O CHOL/HDL RATIO
CHOLESTEROL TOTAL: 178 mg/dL (ref 100–199)
HDL: 45 mg/dL (ref >39)
LDL CALC: 86 mg/dL (ref 0–99)
Triglycerides: 235 mg/dL — ABNORMAL HIGH (ref 0–149)
VLDL CHOLESTEROL CAL: 47 mg/dL — AB (ref 5–40)

## 2016-11-18 LAB — HGB A1C W/O EAG: HEMOGLOBIN A1C: 8.8 % — AB (ref 4.8–5.6)

## 2016-11-19 ENCOUNTER — Ambulatory Visit: Payer: Self-pay | Admitting: Internal Medicine

## 2016-12-09 ENCOUNTER — Encounter: Payer: Self-pay | Admitting: Internal Medicine

## 2016-12-09 ENCOUNTER — Ambulatory Visit (INDEPENDENT_AMBULATORY_CARE_PROVIDER_SITE_OTHER): Payer: Self-pay | Admitting: Internal Medicine

## 2016-12-09 VITALS — BP 122/70 | HR 74 | Resp 12 | Ht 59.0 in | Wt 153.0 lb

## 2016-12-09 DIAGNOSIS — Z1211 Encounter for screening for malignant neoplasm of colon: Secondary | ICD-10-CM

## 2016-12-09 DIAGNOSIS — E119 Type 2 diabetes mellitus without complications: Secondary | ICD-10-CM

## 2016-12-09 DIAGNOSIS — I10 Essential (primary) hypertension: Secondary | ICD-10-CM

## 2016-12-09 LAB — GLUCOSE, POCT (MANUAL RESULT ENTRY): POC GLUCOSE: 296 mg/dL — AB (ref 70–99)

## 2016-12-09 MED ORDER — METFORMIN HCL 1000 MG PO TABS: 1000.0000 mg | ORAL_TABLET | Freq: Two times a day (BID) | ORAL | 11 refills | Status: DC

## 2016-12-09 NOTE — Patient Instructions (Addendum)
Your cholesterol numbers look good. Continue taking atorvastatin each day.  Your blood sugars are too high, so we will increase metformin. Take metformin 1000 mg in the morning and 1000 mg at night.  When you pick up your next prescription from the pharmacy, the tablet strength will be 1000 mg each, so then take 1 tablet twice a day.   Return in 6 months for Hgb A1c.  You should have a colonoscopy to screen for colon cancer.  We will make this referral and contact you.

## 2016-12-09 NOTE — Progress Notes (Signed)
CC: Diabetes and hypertension and lipid follow up  HPI: The patient is a 56 yo F with past medical history significant for obesity, NIDDM, HTN, and endometrial cancer who presents for diabetes follow up.  Her recent labs showed LDL 86, HDL 45 (both improved), Hgb A1c 8.8% (up from 7.7%).  She reports daily adherence to Lipitor and metformin, without side effects.  She brings in a sugar log, shows typical fasting sugars in the mid-100s, evening sugars ranging from 130 to 400s.  She notes that breads and sweets make her sugars go up.  Exercising makes her sugars lower.  She has had no chest pain, dyspnea on exertion, leg swelling, palpitations.  She has had no hematochezia, weight loss, abdominal pain.        PMH:  1. Diabetes 2. Hypertension 3. Hyperlipidemia 4. Obesity 5. Endometrial cancer   Medications:  Prior to Admission medications   Medication Sig Start Date End Date Taking? Authorizing Provider  acetaminophen (TYLENOL) 325 MG tablet Take 650 mg by mouth as needed. Reported on 06/03/2015   Yes [provider]  atorvastatin (LIPITOR) 20 MG tablet Take 1 tablet (20 mg total) by mouth daily. 08/17/16  Yes Mack Hook, MD  Blood Glucose Monitoring Suppl [facility] PRESTO) w/Device KIT Check sugars before meals twice sugars 08/17/16  Yes Mack Hook, MD  glucose blood (AGAMATRIX PRESTO TEST) test strip Check sugars twice daily before meals 08/17/16  Yes Mack Hook, MD  lisinopril (PRINIVIL,ZESTRIL) 2.5 MG tablet Take 1 tablet (2.5 mg total) by mouth daily. 08/17/16  Yes Mack Hook, MD  metFORMIN (GLUCOPHAGE) 1000 MG tablet Take 1 tablet (1,000 mg total) by mouth 2 (two) times daily with a meal. 12/09/16  Yes Tieasha Larsen, Suann Larry, MD  OVER THE COUNTER MEDICATION Take 1 tablet by mouth 2 (two) times daily. Pt takes one in the morning and one at night for menopause symptoms   Yes [provider]       SH: Nonsmoker.  From  [place].  FH:  Sister endometrial cancer Father prostate cancer Mother diabetes     OBJECTIVE: BP 122/70 (BP Location: Left Arm, Patient Position: Sitting, Cuff Size: Normal)   Pulse 74   Resp 12   Ht _0  (1.499 m)   Wt 153 lb (69.4 kg)   BMI 30.90 kg/m  GEN: Well appearing, obese adult female, in NAD. HEENT: No epistaxis, rhinorrhea.  Lids and lashes normal, conjuctiva normal.  OP moist without lesions. COR: RRR without m/r/g.  Distal pulses normal.  No LE edema.  Normal JVP. RESP: Respiratory rate normal.  No wheezes or rales. NEURO/PSYCH: Attention normal, sensorium intact, responding well to questions.  Moves all extremities.     A&P: 1. Type 2 diabetes: Not at goal.  BGs range from 100s to 400s.  Hgb A1c only 8.8% though.  Lately, with exercise, BGs much better. -Increase metformin to 1000 mg BID and return for A1c in 6 months  2. Hypertension: At goal. -Continue lisinopril  3. Hyperlipidemia: Improved.  LDL <100.   -Continue atorvastatin  4. Colonoscopy: Patient was seen by geneticist at time of her endometrial cancer diagnosis because she reported a personal history of colon cancer in her 77s.  She had Lynch syndrome testing which showed only that she has an unknown variant, but the geneticist's notes actually recommend that she be treated as if she has Lynch syndrome simply because of her personal and family history (in addition to her own reported history of colon cancer and  endometrial cancer both, her sister had endometrial cancer and her father had prostate cancer, in his 26s) and that she get ANNUAL colonoscopy, q3-5 year EGD.  However, her translator today is very poor, and both she and her translator have low health literacy (she can provide no details about her "colon cancer"), so I wonder if this "Lynch syndrome" is real. -Referral to gastroenterology for screening colonoscopy       Edwin Dada 12/09/2016, 3:00 PM

## 2016-12-17 MED ORDER — ATORVASTATIN CALCIUM 40 MG PO TABS: ORAL_TABLET | ORAL | 11 refills | Status: DC

## 2016-12-17 NOTE — Addendum Note (Signed)
Addended by: Marcelino Duster on: 12/17/2016 07:34 PM   Modules accepted: Orders

## 2017-01-06 ENCOUNTER — Other Ambulatory Visit: Payer: Self-pay | Admitting: Internal Medicine

## 2017-01-09 LAB — PLEASE NOTE

## 2017-01-09 LAB — FECAL OCCULT BLOOD, IMMUNOCHEMICAL: FECAL OCCULT BLD: NEGATIVE

## 2017-01-11 ENCOUNTER — Encounter (HOSPITAL_COMMUNITY): Payer: Self-pay | Admitting: *Deleted

## 2017-01-11 NOTE — Progress Notes (Signed)
Letter mailed to patient with negative Fit Test results.  

## 2017-02-12 ENCOUNTER — Ambulatory Visit (INDEPENDENT_AMBULATORY_CARE_PROVIDER_SITE_OTHER): Payer: Self-pay | Admitting: Internal Medicine

## 2017-02-12 ENCOUNTER — Encounter: Payer: Self-pay | Admitting: Internal Medicine

## 2017-02-12 ENCOUNTER — Other Ambulatory Visit (INDEPENDENT_AMBULATORY_CARE_PROVIDER_SITE_OTHER): Payer: Self-pay

## 2017-02-12 VITALS — BP 122/82 | HR 76 | Resp 12 | Ht 59.0 in | Wt 159.0 lb

## 2017-02-12 DIAGNOSIS — E7800 Pure hypercholesterolemia, unspecified: Secondary | ICD-10-CM

## 2017-02-12 DIAGNOSIS — K047 Periapical abscess without sinus: Secondary | ICD-10-CM

## 2017-02-12 DIAGNOSIS — Z79899 Other long term (current) drug therapy: Secondary | ICD-10-CM

## 2017-02-12 MED ORDER — IBUPROFEN 200 MG PO TABS: 200.0000 mg | ORAL_TABLET | Freq: Four times a day (QID) | ORAL | 0 refills | Status: DC | PRN

## 2017-02-12 MED ORDER — PENICILLIN V POTASSIUM 250 MG PO TABS: 250.0000 mg | ORAL_TABLET | Freq: Four times a day (QID) | ORAL | 0 refills | Status: DC

## 2017-02-12 NOTE — Patient Instructions (Signed)
Floss your teeth with thin floss daily before brush teeth at bedtime

## 2017-02-12 NOTE — Progress Notes (Signed)
   Subjective:    Patient ID: Laura Douglas, female    DOB: May 28, 1961, 56 y.o.   MRN: [id number]  HPI   1.  Tooth pain for about 3 weeks.  No fever, swelling or drainage from about the tooth.  Feels there is a cavity. Pain is constant Has applied something over the area she purchased over the counter that has not helped. Ibuprofen has not helped. Has a broken bridge or partial as well that needs evaluation and likely replacement.  Current Meds  Medication Sig  . acetaminophen (TYLENOL) 325 MG tablet Take 650 mg by mouth as needed. Reported on 06/03/2015  . atorvastatin (LIPITOR) 40 MG tablet 1 tab by mouth daily with evening meal  . Blood Glucose Monitoring Suppl (AGAMATRIX PRESTO) w/Device KIT Check sugars before meals twice sugars  . glucose blood (AGAMATRIX PRESTO TEST) test strip Check sugars twice daily before meals  . lisinopril (PRINIVIL,ZESTRIL) 2.5 MG tablet Take 1 tablet (2.5 mg total) by mouth daily.  . metFORMIN (GLUCOPHAGE) 1000 MG tablet Take 1 tablet (1,000 mg total) by mouth 2 (two) times daily with a meal.    No Known Allergies      Review of Systems     Objective:   Physical Exam NAD Shows me her partial with left tooth broken at plastic edge HEENT: PERRL, EOMI, TMs pearly gray, throat without injection.  Diffuse dental decay and wearing of enamel.  No swelling of gingiva about the premolar, right upper,but pain with tapping of tooth Neck: Supple, No adenopathy Chest:  CTA CV:  RRR without murmur or rub, radial pulses normal and equal LE:  No edema      Assessment & Plan:  1.  Probable abscessed tooth:  Pen VK 250 mg 4 times daily.  Ibuprofen prn.   Dental referral  2.  Broken dental partial:  Dental referral.

## 2017-02-13 LAB — HEPATIC FUNCTION PANEL
ALBUMIN: 4.6 g/dL (ref 3.5–5.5)
ALK PHOS: 65 IU/L (ref 39–117)
ALT: 28 IU/L (ref 0–32)
AST: 29 IU/L (ref 0–40)
BILIRUBIN, DIRECT: 0.09 mg/dL (ref 0.00–0.40)
Bilirubin Total: 0.3 mg/dL (ref 0.0–1.2)
TOTAL PROTEIN: 7.2 g/dL (ref 6.0–8.5)

## 2017-02-13 LAB — LIPID PANEL W/O CHOL/HDL RATIO
Cholesterol, Total: 152 mg/dL (ref 100–199)
HDL: 45 mg/dL (ref >39)
LDL CALC: 79 mg/dL (ref 0–99)
Triglycerides: 141 mg/dL (ref 0–149)
VLDL CHOLESTEROL CAL: 28 mg/dL (ref 5–40)

## 2017-02-23 IMAGING — US US PELVIS COMPLETE
1 series · 13 of 25 positions shown · non-contrast
Comparison: None

CLINICAL DATA: Postmenopausal bleeding.

EXAM:
TRANSABDOMINAL AND TRANSVAGINAL ULTRASOUND OF PELVIS
TECHNIQUE: Both transabdominal and transvaginal ultrasound examinations of the
pelvis were performed. Transabdominal technique was performed for
global imaging of the pelvis including uterus, ovaries, adnexal
regions, and pelvic cul-de-sac. It was necessary to proceed with
endovaginal exam following the transabdominal exam to visualize the
endometrium and ovaries.

[Series 1: us pelvis complete · 0.18mm/px · 13 of 48 slices shown]
[im 1/48]
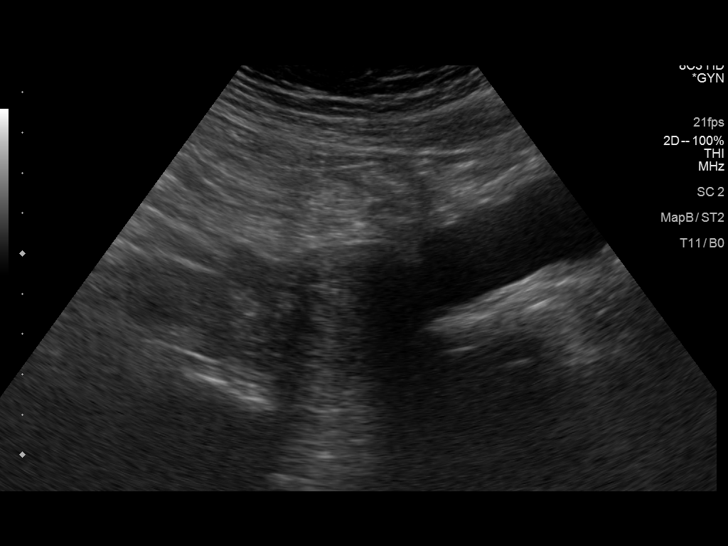
[im 4/48]
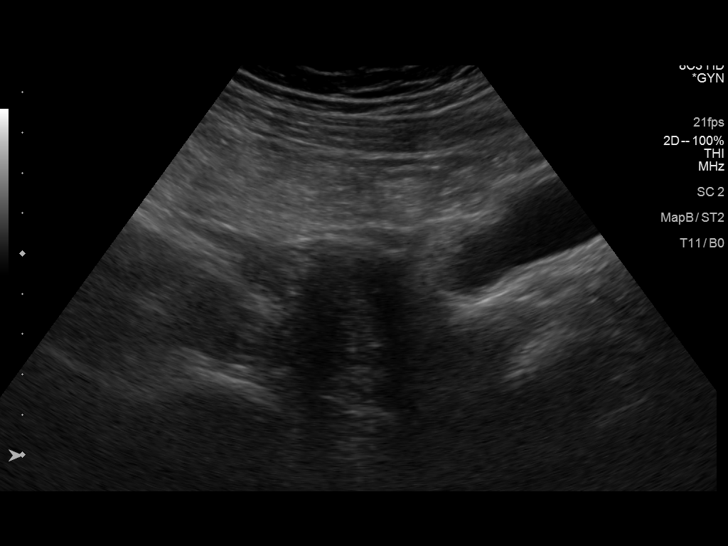
[im 8/48]
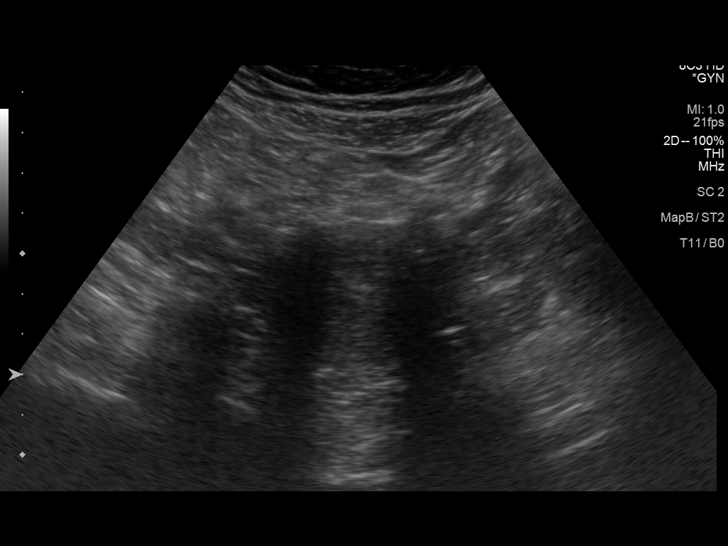
[im 12/48]
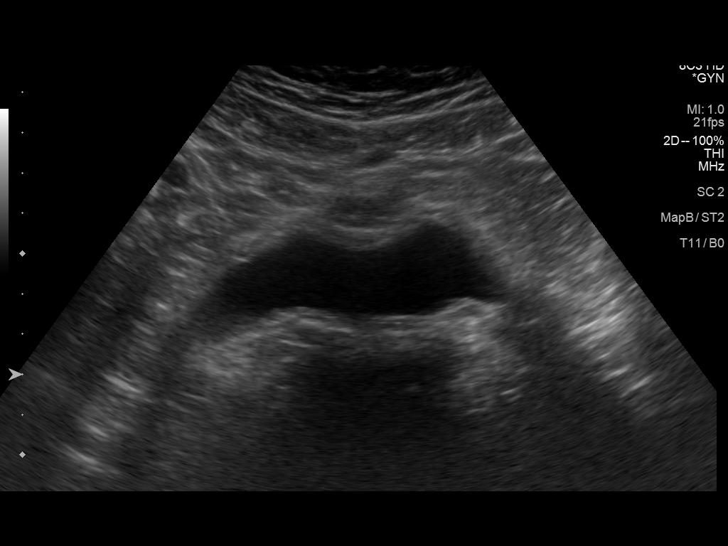
[im 16/48]
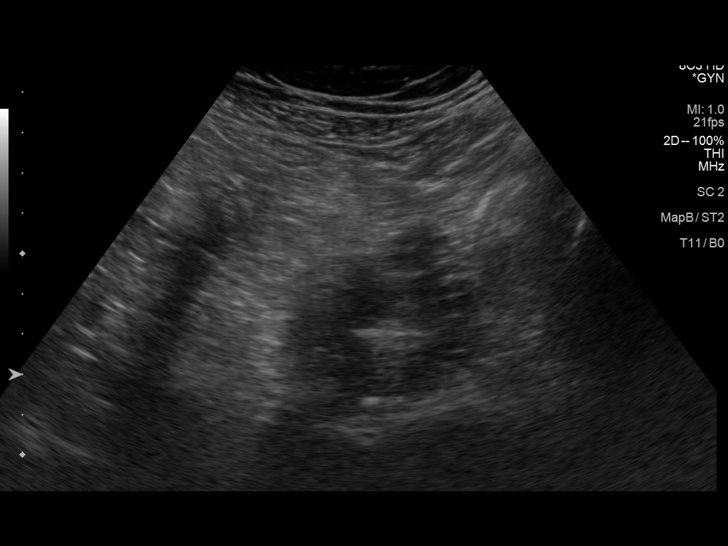
[im 20/48]
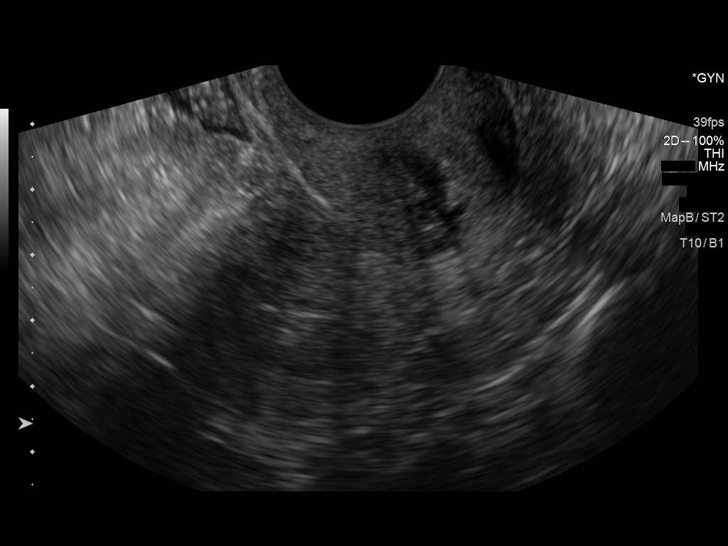
[im 24/48]
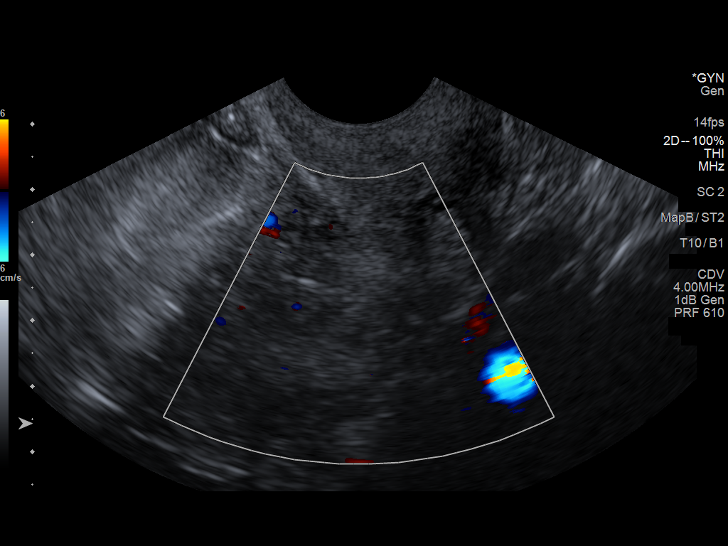
[im 28/48]
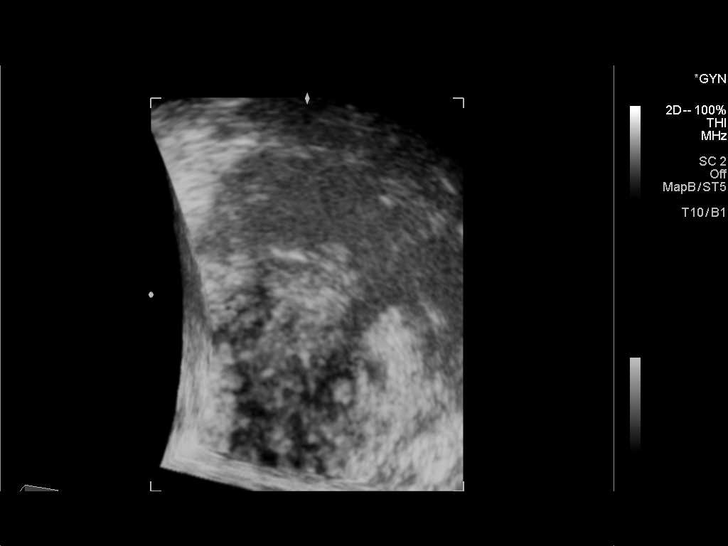
[im 32/48]
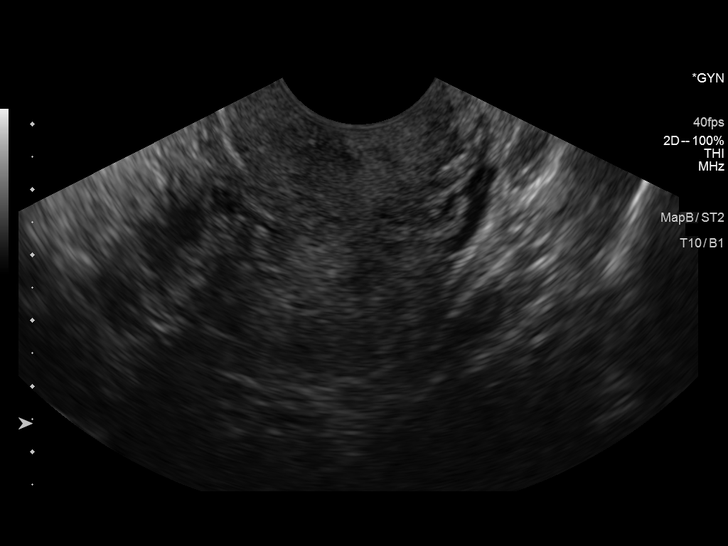
[im 36/48]
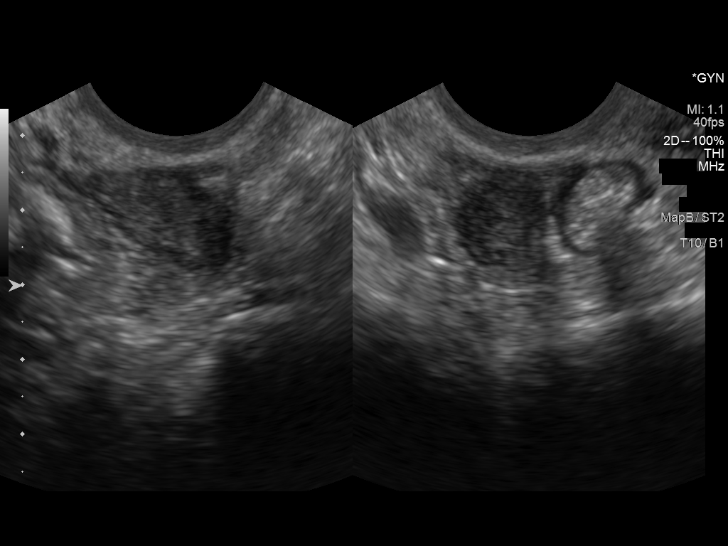
[im 40/48]
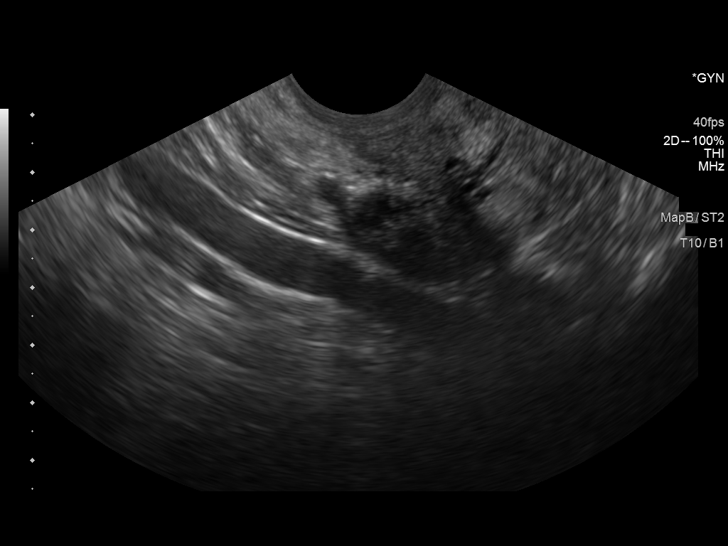
[im 44/48]
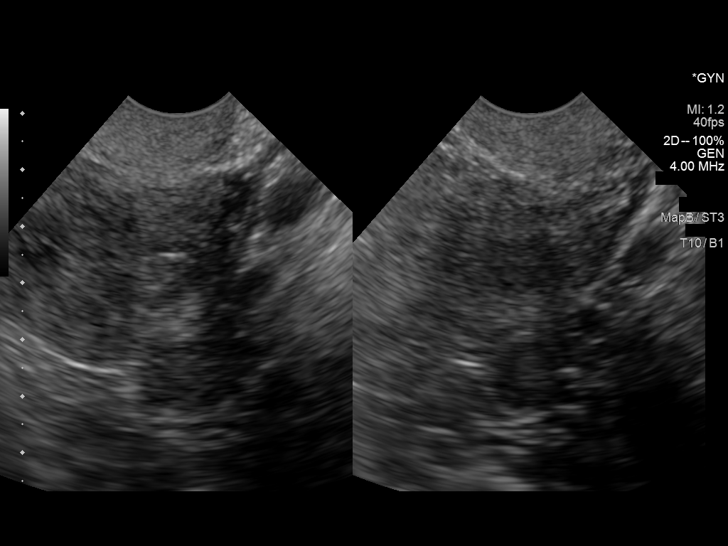
[im 48/48]
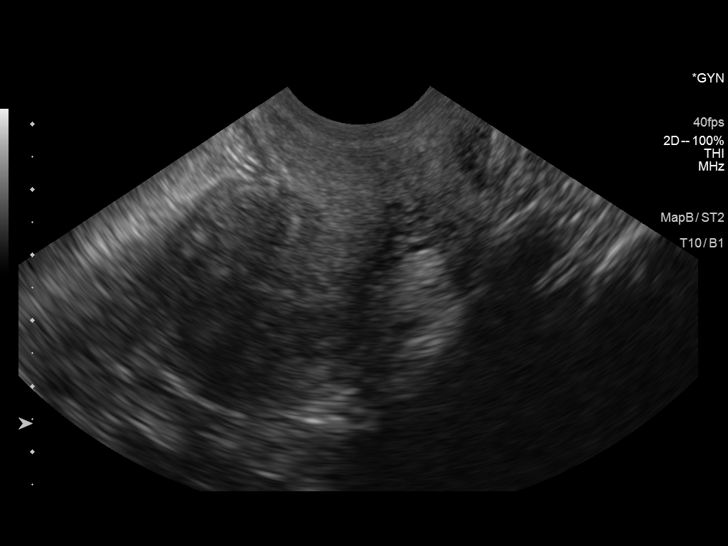

[13 of 25 positions shown; findings below may reference images not displayed]

FINDINGS: Uterus

Measurements: 7.4 x 3.7 x 4.0 cm. No fibroids or other mass
visualized. Heterogeneous echotexture throughout the uterus.

Endometrium

Thickness: 10 mm in thickness. Somewhat difficult visualization due
to the heterogeneous uterine echotexture. No visible focal
abnormality.

Right ovary

Measurements: 2.2 x 1.2 x 1.2 cm. Normal appearance/no adnexal mass.

Left ovary

Measurements: 2.5 x 1.3 x 1.2 cm. Normal appearance/no adnexal mass.

Other findings

No free fluid.
IMPRESSION: Heterogeneous echotexture throughout the uterus. Difficult
visualization of the endometrium which measures approximately 10 mm.
In the setting of post-menopausal bleeding, endometrial sampling is
indicated to exclude carcinoma. If results are benign,
sonohysterogram should be considered for focal lesion work-up. (Ref:
Radiological Reasoning: Algorithmic Workup of Abnormal Vaginal
Bleeding with Endovaginal Sonography and Sonohysterography. AJR
5224; 191:S68-73)

## 2017-03-11 ENCOUNTER — Ambulatory Visit (HOSPITAL_COMMUNITY): Payer: Self-pay

## 2017-03-11 ENCOUNTER — Ambulatory Visit: Payer: Self-pay | Attending: Gynecologic Oncology | Admitting: Gynecologic Oncology

## 2017-03-11 ENCOUNTER — Encounter: Payer: Self-pay | Admitting: Gynecologic Oncology

## 2017-03-11 VITALS — BP 121/61 | HR 87 | Temp 98.3°F | Resp 18 | Ht 59.0 in | Wt 152.2 lb

## 2017-03-11 DIAGNOSIS — Z8049 Family history of malignant neoplasm of other genital organs: Secondary | ICD-10-CM

## 2017-03-11 DIAGNOSIS — C189 Malignant neoplasm of colon, unspecified: Secondary | ICD-10-CM

## 2017-03-11 DIAGNOSIS — Z1509 Genetic susceptibility to other malignant neoplasm: Secondary | ICD-10-CM

## 2017-03-11 DIAGNOSIS — E119 Type 2 diabetes mellitus without complications: Secondary | ICD-10-CM | POA: Insufficient documentation

## 2017-03-11 DIAGNOSIS — C541 Malignant neoplasm of endometrium: Secondary | ICD-10-CM

## 2017-03-11 DIAGNOSIS — Z85038 Personal history of other malignant neoplasm of large intestine: Secondary | ICD-10-CM

## 2017-03-11 DIAGNOSIS — K760 Fatty (change of) liver, not elsewhere classified: Secondary | ICD-10-CM

## 2017-03-11 DIAGNOSIS — I700 Atherosclerosis of aorta: Secondary | ICD-10-CM | POA: Insufficient documentation

## 2017-03-11 DIAGNOSIS — I10 Essential (primary) hypertension: Secondary | ICD-10-CM | POA: Insufficient documentation

## 2017-03-11 DIAGNOSIS — Z15060 Genetic susceptibility to colorectal cancer: Secondary | ICD-10-CM

## 2017-03-11 DIAGNOSIS — Z15068 Genetic susceptibility to other malignant neoplasm of digestive system: Secondary | ICD-10-CM

## 2017-03-11 NOTE — Patient Instructions (Signed)
Plan to follow up in six months or sooner if needed.  Please call in Dec 2018 or Jan 2019 to schedule your appointment.  We will place a referral for you to meet with Dr. Carlean Purl with gastroenterology.  Please call for any needs.

## 2017-03-11 NOTE — Progress Notes (Signed)
GYN ONCOLOGY OFFICE VISIT  CHIEF COMPLAINT:  Endometrial cancer surveillance,    HPI:  Laura Douglas is a 56 y.o. [id number] initially seen in consultation on 08/13/2014 for grade 1 endometrial cancer.  She then underwent a [place] on 9/79/8921 without complications.  Her postoperative course was uncomplicated.  Her final pathologic diagnosis is a Stage IA Grade 1 endometrioid endometrial cancer with no lymphovascular space invasion, 0 mm of myometrial invasion and negative lymph nodes.  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  CT Abd/Pelvis 08/2016 IMPRESSION: No acute findings.  No evidence of metastatic disease. Severe diffuse hepatic steatosis.  Mild aortic atherosclerosis.  Pain has resolved.  Denies upper and lower endoscopy since evaluation for colon cancer in 2009   Review of systems: Constitutional:   She has no fever or chills. Eyes: No blurred vision Ears, Nose, Mouth, Throat: No dizziness, headaches or changes in hearing. No mouth sores. Cardiovascular: No chest pain, palpitations or edema. Respiratory:  No shortness of breath, wheezing or cough Gastrointestinal: She has normal bowel movements without diarrhea or constipation. She denies any nausea or vomiting. She denies blood in her stool or heart burn. Genitourinary:  She reports pelvic pain with intercourse achy and without radiation, no pelvic pressure or changes in her urinary function. She has no hematuria, dysuria, or incontinence. She has no irregular vaginal bleeding or vaginal discharge Musculoskeletal: Denies muscle weakness or joint pains.  Skin:  She has no skin changes, rashes or itching Neurological:  Denies dizziness or headaches. No neuropathy, no numbness or tingling. Psychiatric:  She denies  depression or anxiety. Hematologic/Lymphatic:   No easy bruising or bleeding   Past Medical History  Diagnosis Date  . Hypertension   . Obesity   . Colon cancer [facility])     possible? Patient had surgery in [place]. Denies needing adjuvant therapy. The surgery "removed a tumor from the colon".  . Diabetes mellitus without complication ([place])   . 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 Family history of uterine cancer     Past Surgical History  Procedure Laterality Date  . Fracture surgery  2006    Lt lower arm  . Colon surgery  2010    tumor removal  . Tubal ligation  1993  . Multiple tooth extractions    . Abortions      twice   . Robotic assisted total hysterectomy with bilateral salpingo oopherectomy Bilateral 09/11/2014    Procedure: ROBOTIC ASSISTED TOTAL LAPAROSCOPIC HYSTERECTOMY WITH BILATERAL SALPINGO OOPHORECTOMY LYMPH NODE DISSECTION; LYSIS OF ADHESIONS;  Surgeon: Janie Morning, MD;  Location: [facility];  Service: Gynecology;  Laterality: Bilateral;     Physical Exam: BP 121/61 (BP Location: Left Arm, Patient Position: Sitting)   Pulse 87   Temp 98.3 F (36.8 C) (Oral)   Resp 18   Ht '4\' 11"'  (1.499 m)   Wt 152 lb 3.2 oz (69 kg)   SpO2 100%   BMI 30.74 kg/m   General: Well dressed, well nourished in no apparent distress.   Skin:  No lesions or rashes. Lungs:  Clear to auscultation bilaterally.  No wheezes. Cardiovascular:  Regular rate and rhythm.  No murmurs or rubs. Abdomen:  Soft, nontender, nondistended.  No palpable masses.  No hepatosplenomegaly.  No  ascites. Normal bowel sounds.  No hernias.  Incisions are well healed and soft Genitourinary: Normal EGBUS  Vaginal cuff intact.  No bleeding or discharge.No cul de sac fullness. Extremities: No cyanosis, clubbing or edema.  No calf tenderness or erythema. No palpable cords. Psychiatric: Mood and affect are appropriate. Neurological: Awake, alert and oriented x 3. Sensation is intact, no neuropathy.   Musculoskeletal: No pain, normal strength and range of motion.  Assessment/Plan:   56 y.o. with Stage IA Grade 1 endometrioid endometrial cancer.   S/p RATLH BSO BPLND 09/11/2014 No LVSI, 0% myometrial invasion, neg  lymph nodes. 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  Laura Douglas denies GI evaluation since her 2009 colon cancer diagnosis.  Severe hepatic steatosis Identified on imaging. Follow-up with PCP Dr. Tawni Carnes  for further evaluation.

## 2017-03-16 ENCOUNTER — Encounter: Payer: Self-pay | Admitting: Gastroenterology

## 2017-04-08 ENCOUNTER — Telehealth: Payer: Self-pay | Admitting: Internal Medicine

## 2017-04-08 NOTE — Telephone Encounter (Signed)
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  Patient has the lab results and she is ok to drop it off at the clinic today.  To Estefania for further direction.

## 2017-04-19 NOTE — Telephone Encounter (Signed)
To Dr. Mulberry for further direction 

## 2017-04-19 NOTE — Telephone Encounter (Signed)
In future, they should automatically be told to take to [facility] as that is where she can get the CXR and treatment.  Please notify I thought this had been taken care of verbally

## 2017-04-21 NOTE — Telephone Encounter (Signed)
Patient notified and verbally agreed on 04/21/17

## 2017-04-21 NOTE — Telephone Encounter (Signed)
To Antony Madura to notify patient

## 2017-05-03 ENCOUNTER — Encounter: Payer: Self-pay | Admitting: Internal Medicine

## 2017-05-04 NOTE — Telephone Encounter (Signed)
No direct EGD/colon ok

## 2017-05-04 NOTE — Telephone Encounter (Signed)
Dr. Carlean Purl,  I noticed while reviewing her chart, this patient has an extensive history.  She is scheduled for a colonoscopy on 12/14 and has never seen you.  She has a history of colon cancer in 2009 and had a colonoscopy in [place] more than 10 years ago (noted when appointment was made).  She also had recently been diagnosed with Endometrial Cancer.  Dr. Amil Amen was wanting patient to see a GI physician to have a colonoscopy and endoscopy.  She was scheduled as a direct.  Does she need to see you in the office first?  Please advise.  Thank you.  Elizabeth Palau, CMA-PV

## 2017-05-05 NOTE — Telephone Encounter (Signed)
Thank you for catching this  I am not sure if we should/could do + PPD or quantiferon in [place] - probably not and need to put procedures on hold until that is sorted out  We need to contact the patient and also get records from [facility] to find out what the status is re: TB

## 2017-05-05 NOTE — Telephone Encounter (Signed)
Dr.Gessner, looks like at the bottom of this note the patient was told she had positive TB test on 04/21/17. I am unsure if she was treated. Would she still be okay for direct colon at [facility]? Please advise. Thanks, Katy Brickell pv

## 2017-05-10 ENCOUNTER — Encounter: Payer: Self-pay | Admitting: Physician Assistant

## 2017-05-10 NOTE — Telephone Encounter (Signed)
Patient was notified in [place] by Yes Pilar Plate and she made pt office visit w/ Amy PA for 05/18/17. Procedure and pre-visit cancelled at this time also.

## 2017-05-17 ENCOUNTER — Encounter: Payer: Self-pay | Admitting: Gastroenterology

## 2017-05-18 ENCOUNTER — Ambulatory Visit: Payer: Self-pay | Admitting: Physician Assistant

## 2017-05-28 ENCOUNTER — Other Ambulatory Visit: Payer: Self-pay | Admitting: Obstetrics and Gynecology

## 2017-05-28 ENCOUNTER — Encounter: Payer: Self-pay | Admitting: Internal Medicine

## 2017-05-28 DIAGNOSIS — Z1231 Encounter for screening mammogram for malignant neoplasm of breast: Secondary | ICD-10-CM

## 2017-05-31 ENCOUNTER — Other Ambulatory Visit: Payer: Self-pay

## 2017-05-31 DIAGNOSIS — E119 Type 2 diabetes mellitus without complications: Secondary | ICD-10-CM

## 2017-06-01 LAB — HGB A1C W/O EAG: Hgb A1c MFr Bld: 7.9 % — ABNORMAL HIGH (ref 4.8–5.6)

## 2017-07-01 ENCOUNTER — Ambulatory Visit
Admission: RE | Admit: 2017-07-01 | Discharge: 2017-07-01 | Disposition: A | Discharge status: Home / Self Care | Payer: No Typology Code available for payment source | Source: Ambulatory Visit | Attending: Obstetrics and Gynecology | Admitting: Obstetrics and Gynecology

## 2017-07-01 ENCOUNTER — Encounter (HOSPITAL_COMMUNITY): Payer: Self-pay

## 2017-07-01 ENCOUNTER — Other Ambulatory Visit (HOSPITAL_COMMUNITY): Payer: Self-pay | Admitting: *Deleted

## 2017-07-01 ENCOUNTER — Ambulatory Visit (HOSPITAL_COMMUNITY)
Admission: RE | Admit: 2017-07-01 | Discharge: 2017-07-01 | Disposition: A | Discharge status: Home / Self Care | Payer: Self-pay | Source: Ambulatory Visit | Attending: Obstetrics and Gynecology | Admitting: Obstetrics and Gynecology

## 2017-07-01 VITALS — BP 132/80 | Ht 59.0 in | Wt 155.4 lb

## 2017-07-01 DIAGNOSIS — Z1239 Encounter for other screening for malignant neoplasm of breast: Secondary | ICD-10-CM

## 2017-07-01 DIAGNOSIS — N644 Mastodynia: Secondary | ICD-10-CM

## 2017-07-01 DIAGNOSIS — Z1231 Encounter for screening mammogram for malignant neoplasm of breast: Secondary | ICD-10-CM

## 2017-07-01 NOTE — Progress Notes (Signed)
Complaints of left outer breast pain and breast fullness that comes and goes x 2 years. Patient rates the pain at a 4 out of 10.   Pap Smear: Pap smear not completed today. Last Pap smear was 3 months ago and normal per patient. Per patient has no history of an abnormal Pap smear. Patient has a history of a hysterectomy 09/11/2014 due to endometrial cancer. Patient needs to continue yearly Pap smears due to her history of a hysterectomy due to endometrial cancer. No Pap smear results are in EPIC.  Physical exam: Breasts Breasts symmetrical. No skin abnormalities bilateral breasts. No nipple retraction bilateral breasts. No nipple discharge bilateral breasts. No lymphadenopathy. No lumps palpated bilateral breasts. Complaints of left outer breast tenderness on exam. Referred patient to the [facility] for diagnostic mammogram and possible left breast ultrasound. Appointment scheduled for Tuesday, July 01, 2017 at 1400.        Pelvic/Bimanual No Pap smear completed today since last Pap smear was 3 months ago per patient. Pap smear not indicated per BCCCP guidelines.   Smoking History: Patient has never smoked.  Patient Navigation: Patient education provided. Access to services provided for patient through [facility] program. Spanish interpreter provided.  Colorectal Cancer Screening: Per patient had a colonoscopy completed 21 years ago. No complaints today. FIT Test given to patient to complete and return to BCCCP.  Used Spanish interpreter [employer] from [place].

## 2017-07-01 NOTE — Patient Instructions (Signed)
Explained breast self awareness with Laura Douglas. Patient did not need a Pap smear today due to last Pap smear was 3 months ago per patient. Patient is currently being followed by GYN Oncology at the [facility]. Referred patient to the [facility] for diagnostic mammogram and possible left breast ultrasound. Appointment scheduled for Tuesday, July 01, 2017 at 1400. Patient aware of appointment and will be there. Jearld Adjutant Galeano-Ramirez verbalized understanding.  Waynesha Rammel, Arvil Chaco, RN 2:56 PM

## 2017-07-02 ENCOUNTER — Other Ambulatory Visit: Payer: Self-pay

## 2017-07-05 ENCOUNTER — Encounter (HOSPITAL_COMMUNITY): Payer: Self-pay | Admitting: *Deleted

## 2017-07-06 ENCOUNTER — Other Ambulatory Visit: Payer: Self-pay

## 2017-07-13 ENCOUNTER — Ambulatory Visit
Admission: RE | Admit: 2017-07-13 | Discharge: 2017-07-13 | Disposition: A | Discharge status: Home / Self Care | Payer: No Typology Code available for payment source | Source: Ambulatory Visit | Attending: Obstetrics and Gynecology | Admitting: Obstetrics and Gynecology

## 2017-07-13 ENCOUNTER — Ambulatory Visit: Payer: Self-pay

## 2017-07-13 DIAGNOSIS — N644 Mastodynia: Secondary | ICD-10-CM

## 2017-07-29 ENCOUNTER — Encounter: Payer: Self-pay | Admitting: Internal Medicine

## 2017-07-29 ENCOUNTER — Ambulatory Visit: Payer: Self-pay | Admitting: Internal Medicine

## 2017-07-29 VITALS — BP 120/80 | HR 98 | Resp 16 | Ht 59.0 in | Wt 153.0 lb

## 2017-07-29 DIAGNOSIS — E663 Overweight: Secondary | ICD-10-CM

## 2017-07-29 DIAGNOSIS — E119 Type 2 diabetes mellitus without complications: Secondary | ICD-10-CM

## 2017-07-29 DIAGNOSIS — E7800 Pure hypercholesterolemia, unspecified: Secondary | ICD-10-CM

## 2017-07-29 DIAGNOSIS — K760 Fatty (change of) liver, not elsewhere classified: Secondary | ICD-10-CM

## 2017-07-29 DIAGNOSIS — K029 Dental caries, unspecified: Secondary | ICD-10-CM

## 2017-07-29 DIAGNOSIS — I10 Essential (primary) hypertension: Secondary | ICD-10-CM

## 2017-07-29 LAB — GLUCOSE, POCT (MANUAL RESULT ENTRY): POC Glucose: 178 mg/dl — AB (ref 70–99)

## 2017-07-29 MED ORDER — LISINOPRIL 2.5 MG PO TABS: 2.5000 mg | ORAL_TABLET | Freq: Every day | ORAL | 11 refills | Status: DC

## 2017-07-29 MED ORDER — GLIPIZIDE 5 MG PO TABS: ORAL_TABLET | ORAL | 11 refills | Status: DC

## 2017-07-29 NOTE — Patient Instructions (Signed)

## 2017-07-29 NOTE — Progress Notes (Signed)
Subjective:    Patient ID: Laura Douglas, female    DOB: 1960-07-06, 57 y.o.   MRN: [id number]  HPI   1.  History of colon cancer at age 77 yo and endometrial cancer in August 25, 2014.  Father died of prostate cancer at 40 yo.  Sister with history of colon cancer and uterine cancer age 63 yo.  Maternal aunt died with uterine cancer, unknown age. She underwent genetic testing and felt to have a variant of Lynch Syndrome.   Is to have colonoscopy yearly and EGD every 3-4 years.   She reportedly went to Dr. Carlean Purl GI to get restarted with this, but in meantime diagnosed with TB and is currently being treated through [facility].   Had Mammogram 07/13/2017 and normal.  2.  NIDDM:  A1C in December was still not at goal at 7.9% in December.  She is only checking her sugars twice weekly.  Taking unknown supplement she states is for DM.   Also Metformin 1000 mg twice daily.   Diet:   Breakfast:  Soy milk, yogurt.  Piece of wheat toast. Coffee with creamer.   May have an egg--boiled. May have cereal--sugar free.    Snack:  Apple with skin or a banana  Lunch: Chicken, beans, brown rice.  Broccoli, carrots, potatoes with skin.   Some sort of diet juice drink--tastes sweet.    Snack:  Egg, beans, fried bananas.    Dinner:  Eggs, sometimes cereal or yogurt.  Water.     Zumba 30 minutes 3 times weekly. Up and down steps frequently at apartment.    Has never had eyes checked Maybe checks feet every night. Has never had a pneumococcal vaccine. Received influenza vaccine with a physical for her immigration paperwork at [facility]. May have received multiple vaccines   3.  Hyperlipidemia and fatty liver by CT 09/09/16:  Taking Atorvastatin 40 mg daily and FLP last check was close to goal with LDL at 79.   Lipid Panel     Component Value Date/Time   CHOL 152 02/12/2017 0851   TRIG 141 02/12/2017 0851   HDL 45 02/12/2017 0851   CHOLHDL 6.7 (H) 06/04/2015 0925   VLDL 67 (H) 06/04/2015  0925   LDLCALC 79 02/12/2017 0851    4.  Essential Hypertension:  Lisinopril daily.  No problems.  Current Meds  Medication Sig  . atorvastatin (LIPITOR) 40 MG tablet 1 tab by mouth daily with evening meal  . Blood Glucose Monitoring Suppl (AGAMATRIX PRESTO) w/Device KIT Check sugars before meals twice sugars  . glucose blood (AGAMATRIX PRESTO TEST) test strip Check sugars twice daily before meals  . lisinopril (PRINIVIL,ZESTRIL) 2.5 MG tablet Take 1 tablet (2.5 mg total) by mouth daily.  . metFORMIN (GLUCOPHAGE) 1000 MG tablet Take 1 tablet (1,000 mg total) by mouth 2 (two) times daily with a meal.    No Known Allergies  Review of Systems     Objective:   Physical Exam Obese, NAD HEENT: PERRL, EOMI, discs sharp, TMs pearly gray, throat without injection.  Dental decay. Neck:  Supple, No adenopathy, no thyromegaly Chest:  CTA CV:  RRR with normal S1 and S2, No S3, S4 or murmur.  No carotid bruits.  Carotid, radial, DP and PT pulses normal and equal. LE:  No edema Skin:  5 mm mildly rough pink area at right zygomatic arch--has had for 1 month.         Assessment & Plan:  1.  DM: Start Glipizide  2.5 mg twice daily with Metformin 1000 mg twice daily.   To check sugars twice daily before meals and document.  Eye referral   2.  Hypertension: refill Lisinopril.    3.  Hyperlipidemia:  Work on diet and physical activity.  Continue Atorvastatin.  4.  Lynch Syndrome variant:  Releases of information from [facility]D, [facility] and Dr. Celesta Aver office.  5.  Facial skin lesion:  Derm referral if still present or worsening on follow up--possible actinic keratosis.  6.  Dental Decay:  Dental referral    7.  Overweight and hepatic steatosis:  Lifestyle changes and treatment of DM/cholesterol

## 2017-07-30 ENCOUNTER — Ambulatory Visit: Payer: Self-pay | Admitting: Internal Medicine

## 2017-07-30 LAB — FECAL OCCULT BLOOD, IMMUNOCHEMICAL

## 2017-08-27 ENCOUNTER — Other Ambulatory Visit: Payer: Self-pay

## 2017-08-27 MED ORDER — ATORVASTATIN CALCIUM 40 MG PO TABS: ORAL_TABLET | ORAL | 11 refills | Status: DC

## 2017-08-29 MED ORDER — GLUCOSE BLOOD VI STRP: ORAL_STRIP | 12 refills | Status: DC

## 2017-10-25 ENCOUNTER — Other Ambulatory Visit: Payer: Self-pay

## 2017-10-28 ENCOUNTER — Ambulatory Visit: Payer: Self-pay | Admitting: Internal Medicine

## 2018-01-03 ENCOUNTER — Other Ambulatory Visit: Payer: Self-pay

## 2018-01-03 DIAGNOSIS — Z79899 Other long term (current) drug therapy: Secondary | ICD-10-CM

## 2018-01-03 DIAGNOSIS — E119 Type 2 diabetes mellitus without complications: Secondary | ICD-10-CM

## 2018-01-03 DIAGNOSIS — E7800 Pure hypercholesterolemia, unspecified: Secondary | ICD-10-CM

## 2018-01-04 LAB — COMPREHENSIVE METABOLIC PANEL
A/G RATIO: 2.1 (ref 1.2–2.2)
ALK PHOS: 60 IU/L (ref 39–117)
ALT: 30 IU/L (ref 0–32)
AST: 29 IU/L (ref 0–40)
Albumin: 4.8 g/dL (ref 3.5–5.5)
BILIRUBIN TOTAL: 0.4 mg/dL (ref 0.0–1.2)
BUN/Creatinine Ratio: 15 (ref 9–23)
BUN: 11 mg/dL (ref 6–24)
CHLORIDE: 102 mmol/L (ref 96–106)
CO2: 24 mmol/L (ref 20–29)
Calcium: 9.6 mg/dL (ref 8.7–10.2)
Creatinine, Ser: 0.71 mg/dL (ref 0.57–1.00)
GFR calc Af Amer: 111 mL/min/{1.73_m2} (ref >59)
GFR calc non Af Amer: 96 mL/min/{1.73_m2} (ref >59)
GLUCOSE: 122 mg/dL — AB (ref 65–99)
Globulin, Total: 2.3 g/dL (ref 1.5–4.5)
POTASSIUM: 4.4 mmol/L (ref 3.5–5.2)
Sodium: 142 mmol/L (ref 134–144)
Total Protein: 7.1 g/dL (ref 6.0–8.5)

## 2018-01-04 LAB — LIPID PANEL W/O CHOL/HDL RATIO
Cholesterol, Total: 182 mg/dL (ref 100–199)
HDL: 52 mg/dL (ref >39)
LDL Calculated: 96 mg/dL (ref 0–99)
Triglycerides: 170 mg/dL — ABNORMAL HIGH (ref 0–149)
VLDL Cholesterol Cal: 34 mg/dL (ref 5–40)

## 2018-01-04 LAB — MICROALBUMIN / CREATININE URINE RATIO
CREATININE, UR: 151.4 mg/dL
MICROALB/CREAT RATIO: 2.8 mg/g{creat} (ref 0.0–30.0)
MICROALBUM., U, RANDOM: 4.2 ug/mL

## 2018-01-04 LAB — HGB A1C W/O EAG: HEMOGLOBIN A1C: 6.9 % — AB (ref 4.8–5.6)

## 2018-01-06 ENCOUNTER — Ambulatory Visit: Payer: Self-pay | Admitting: Internal Medicine

## 2018-01-06 ENCOUNTER — Encounter: Payer: Self-pay | Admitting: Internal Medicine

## 2018-01-06 VITALS — BP 122/78 | HR 72 | Resp 12 | Ht 59.0 in | Wt 156.0 lb

## 2018-01-06 DIAGNOSIS — E663 Overweight: Secondary | ICD-10-CM

## 2018-01-06 DIAGNOSIS — R600 Localized edema: Secondary | ICD-10-CM | POA: Insufficient documentation

## 2018-01-06 DIAGNOSIS — E119 Type 2 diabetes mellitus without complications: Secondary | ICD-10-CM

## 2018-01-06 DIAGNOSIS — L989 Disorder of the skin and subcutaneous tissue, unspecified: Secondary | ICD-10-CM

## 2018-01-06 DIAGNOSIS — Z1509 Genetic susceptibility to other malignant neoplasm: Secondary | ICD-10-CM

## 2018-01-06 DIAGNOSIS — Z23 Encounter for immunization: Secondary | ICD-10-CM

## 2018-01-06 DIAGNOSIS — E782 Mixed hyperlipidemia: Secondary | ICD-10-CM

## 2018-01-06 DIAGNOSIS — C189 Malignant neoplasm of colon, unspecified: Secondary | ICD-10-CM

## 2018-01-06 DIAGNOSIS — I10 Essential (primary) hypertension: Secondary | ICD-10-CM

## 2018-01-06 DIAGNOSIS — R609 Edema, unspecified: Secondary | ICD-10-CM | POA: Insufficient documentation

## 2018-01-06 LAB — GLUCOSE, POCT (MANUAL RESULT ENTRY): POC Glucose: 96 mg/dl (ref 70–99)

## 2018-01-06 NOTE — Patient Instructions (Signed)
[email or web address]  Walker thigh high closed toe moderate compression stockings 15-20 mm Hg  Put compression stockings on in morning before work and remove after work.  Elevate legs as much as possible.  Decrease sodium to 4000 mg daily or less

## 2018-01-06 NOTE — Progress Notes (Signed)
Subjective:    Patient ID: Laura Douglas, female    DOB: 12/17/1960, 57 y.o.   MRN: [id number]  HPI   Here for follow up of labs:  1.  DM:  Has worked A1C down below 7.0% since last spring.  On 7/22 measured 6.9%. She is being physically active.  Has changed her diet significantly.   Continues Glipizide and Metformin.  2. Hyperlipidemia:  LDL a bit up from last check in August 2018 as are her triglycerides  Lipid Panel     Component Value Date/Time   CHOL 182 01/03/2018 0850   TRIG 170 (H) 01/03/2018 0850   HDL 52 01/03/2018 0850   CHOLHDL 6.7 (H) 06/04/2015 0925   VLDL 67 (H) 06/04/2015 0925   LDLCALC 96 01/03/2018 0850   3.  HM  Cannot recall last Td.   Has not had pneumococcal 23, but not available in clinic today.  4.  Past 3 months, has had swelling of her right ankle/foot.  No history of injury.  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  She is on her feet for long periods of time.  When she goes home and elevated, foot and ankle go down.    Breakfast: 2 corn tortillas with cheese.  With coffee with [place].  Next meal:  Spaghetti made with tomato sauce, ground beef, rice, cheese,   Dinner:  Shrimp with some sort of canned sauce.  She does salt.    Drinking water otherwise.    5.  Lesion on right face.  Gets itchy and dry and thick.  Has had for 2 months.  Does not heal.  Denies significant sun exposure in past.   6.  Lynch syndrome with history of colon cancer in her 22s.  For some reason did not get her colonoscopy last year after referral.  Patient states it was cancelled as she had a positive TB test for her immigration services paperwork last year, her CXR was negative and had to undergo treatment for latent TB.  She believes she was treated for 3-4 months, completed about February.   Current Meds  Medication Sig  . acetaminophen (TYLENOL) 325 MG tablet Take 650 mg by mouth as needed. Reported on 06/03/2015  . atorvastatin  (LIPITOR) 40 MG tablet 1 tab by mouth daily with evening meal  . Blood Glucose Monitoring Suppl (AGAMATRIX PRESTO) w/Device KIT Check sugars before meals twice sugars  . glipiZIDE (GLUCOTROL) 5 MG tablet 1/2 tab by mouth twice daily with meals  . glucose blood (AGAMATRIX PRESTO TEST) test strip Check sugars twice daily before meals  . lisinopril (PRINIVIL,ZESTRIL) 2.5 MG tablet Take 1 tablet (2.5 mg total) by mouth daily.  . metFORMIN (GLUCOPHAGE) 1000 MG tablet Take 1 tablet (1,000 mg total) by mouth 2 (two) times daily with a meal.    No Known Allergies   Review of Systems     Objective:   Physical Exam HEENT:  PERRL, EOMI,  Skin, right face over lateral zygomatic arch:  Less than 1 cm area with skin thickening and pink coloration with central pinhole to deeper tissue.  Dry.  Difficult to characterize as obviously picked at. Skin pale with areas of mild hyperpigmentation and diffuse freckling Neck:  Supple, No adenopathy Chest: CTA CV:  RRR without murmur or rub. Radial and DP pulses normal and equal Abd:  S, NT, No HSM or mass, mid line surgical scar.  + BS LE:  Bilateral mild to moderate pitting edema  to just below knee.    Diabetic Foot Exam - Simple   Simple Foot Form Diabetic Foot exam was performed with the following findings:  Yes 01/06/2018  4:50 PM  Visual Inspection No deformities, no ulcerations, no other skin breakdown bilaterally:  Yes Sensation Testing Intact to touch and monofilament testing bilaterally:  Yes Pulse Check Posterior Tibialis and Dorsalis pulse intact bilaterally:  Yes Comments           Assessment & Plan:  1.  DM:  Much improved control  Continue Glipizide and Metformin and continue to work on diet and physical activity for weight loss and control  2.  Hyperlipidemia:  LDL and Triglycerides not at goal.  Continue Atorvastatin 40 mg and work on lifestyle changes as above. Recheck in 4 months.  3.  Hypertension:  Controlled with  Lisinopril  4.  Peripheral edema:  Thigh high graduated compression stockings and sodium limitation.  Gave website of [employer] for low cost. If no improvement in next 2 months, to call and will add HCTZ vs Furosemide.  5. Right facial skin lesion:  Not clear if this is an actinic keratosis or possible cancer or something benign.   She is to stop picking at lesion. Derm referral.  6.  HM:  Tdap.  Needs pneumovax 23, but out today.  Encouraged influenza vaccine in fall--to call beginning of September to find out when flu clinics are. HIV and Hep C screening with next set of labs.  7.  History of colon cancer in her 19s with Lynch syndrome:  rerefer to [facility] for colonoscopy. Mammogram up to date.  Follows with [facility] clinic for history of endometrial cancer.

## 2018-01-27 ENCOUNTER — Telehealth: Payer: Self-pay | Admitting: Internal Medicine

## 2018-01-27 NOTE — Telephone Encounter (Signed)
To Dr. Mulberry for further direction 

## 2018-01-27 NOTE — Telephone Encounter (Signed)
Patient called stating she is still having lower extremity swelling. Patient saw Dr. Amil Amen in July and was informed to let her know if swelling continues. Patient requesting a water pill be sent to her pharmacy.  To Dr. Amil Amen to advise

## 2018-01-28 ENCOUNTER — Other Ambulatory Visit: Payer: Self-pay | Admitting: Internal Medicine

## 2018-01-28 MED ORDER — HYDROCHLOROTHIAZIDE 12.5 MG PO TABS: 12.5000 mg | ORAL_TABLET | Freq: Every day | ORAL | 11 refills | Status: DC

## 2018-01-28 NOTE — Telephone Encounter (Signed)
Patient is being informed 01/28/18

## 2018-01-28 NOTE — Telephone Encounter (Signed)
Please notify sending in Rx for HCTZ 12.5 mg daily in morning with Lisinopril

## 2018-01-28 NOTE — Telephone Encounter (Signed)
To Laura Douglas to notify patient

## 2018-01-31 ENCOUNTER — Ambulatory Visit (AMBULATORY_SURGERY_CENTER): Payer: Self-pay

## 2018-01-31 VITALS — Ht 62.0 in | Wt 155.4 lb

## 2018-01-31 DIAGNOSIS — Z800 Family history of malignant neoplasm of digestive organs: Secondary | ICD-10-CM

## 2018-01-31 DIAGNOSIS — Z85038 Personal history of other malignant neoplasm of large intestine: Secondary | ICD-10-CM

## 2018-01-31 MED ORDER — NA SULFATE-K SULFATE-MG SULF 17.5-3.13-1.6 GM/177ML PO SOLN: 1.0000 | Freq: Once | ORAL | 0 refills | Status: AC

## 2018-01-31 MED ORDER — NA SULFATE-K SULFATE-MG SULF 17.5-3.13-1.6 GM/177ML PO SOLN: 1.0000 | Freq: Once | ORAL | 0 refills | Status: DC

## 2018-01-31 NOTE — Progress Notes (Signed)
Julia-interpretor was with the pt at her PV to help with medical questions, prep instructions and to answer all questions.  Per Gregary Signs, pt does not have  allergies to soy or egg products.Pt not taking any weight loss meds or using  O2 at home.  Per Gregary Signs, pt refused emmi video.

## 2018-02-17 ENCOUNTER — Encounter: Payer: Self-pay | Admitting: Gastroenterology

## 2018-02-17 ENCOUNTER — Ambulatory Visit (AMBULATORY_SURGERY_CENTER): Payer: Self-pay | Admitting: Gastroenterology

## 2018-02-17 VITALS — BP 144/82 | HR 89 | Temp 97.5°F | Resp 14 | Ht 62.0 in | Wt 155.0 lb

## 2018-02-17 DIAGNOSIS — D127 Benign neoplasm of rectosigmoid junction: Secondary | ICD-10-CM

## 2018-02-17 DIAGNOSIS — D123 Benign neoplasm of transverse colon: Secondary | ICD-10-CM

## 2018-02-17 DIAGNOSIS — Z85038 Personal history of other malignant neoplasm of large intestine: Secondary | ICD-10-CM

## 2018-02-17 HISTORY — PX: COLONOSCOPY: SHX174

## 2018-02-17 MED ORDER — SODIUM CHLORIDE 0.9 % IV SOLN: 500.0000 mL | Freq: Once | INTRAVENOUS | Status: DC

## 2018-02-17 NOTE — Patient Instructions (Signed)
Make appointment to see Dr. Allison Quarry in 6 months for follow up in  March 2020   Resume current diet and medications.  Return to normal activity tomorrow.   Pathology results by mail in 7-10 days,  Recommend colonoscopy in one (1) year. Recommend a upper endoscopy very 2-3 years .       USTED TUVO UN PROCEDIMIENTO ENDOSCPICO HOY EN EL [facility]:   Lea el informe del procedimiento que se le entreg para cualquier pregunta especfica sobre lo que se Primary school teacher.  Si el informe del examen no responde a sus preguntas, por favor llame a su gastroenterlogo para aclararlo.  Si usted solicit que no se le den [employer] de lo que se Estate manager/land agent en su procedimiento al [employer] va a cuidar, entonces el informe del procedimiento se ha incluido en un sobre sellado para que usted lo revise despus cuando le sea ms conveniente.    LO QUE PUEDE ESPERAR: Algunas sensaciones de hinchazn en el abdomen.  Puede tener ms gases de lo normal.  El caminar puede ayudarle a eliminar el aire que se le puso en el tracto gastrointestinal durante el procedimiento y reducir la hinchazn.  Si le hicieron una endoscopia inferior (como una colonoscopia o una sigmoidoscopia flexible), podra notar manchas de sangre en las heces fecales o en el papel higinico.  Si se someti a una preparacin intestinal para su procedimiento, es posible que no tenga una evacuacin intestinal normal durante [employer].   Tenga en cuenta:  Es posible que note un poco de irritacin y congestin en la nariz o algn drenaje.  Esto es debido al oxgeno [employer] durante su procedimiento.  No hay que preocuparse y esto debe desaparecer ms o Scientist, research (medical).   SNTOMAS PARA REPORTAR INMEDIATAMENTE:  Despus de una endoscopia inferior (colonoscopia o sigmoidoscopia flexible):  Cantidades excesivas de sangre en las heces fecales  Sensibilidad significativa o empeoramiento de los dolores abdominales   Hinchazn aguda del abdomen que antes no tena   Fiebre de 100F o ms      Para asuntos urgentes o de Freight forwarder, puede comunicarse con un gastroenterlogo a cualquier hora llamando al [phone].  DIETA:  Recomendamos una comida pequea al principio, pero luego puede continuar con su dieta normal.  Tome muchos lquidos, Teacher, adult education las bebidas alcohlicas durante 24 horas.    ACTIVIDAD:  Debe planear tomarse las cosas con calma por el resto del da y no debe CONDUCIR ni usar maquinaria pesada Programmer, applications (debido a los medicamentos de sedacin utilizados durante el examen).     SEGUIMIENTO: Nuestro personal llamar al nmero que aparece en su historial al siguiente da hbil de su procedimiento para ver cmo se siente y para responder cualquier pregunta o inquietud que pueda tener con respecto a la informacin que se le dio despus del procedimiento. Si no podemos contactarle, le dejaremos un mensaje.  Sin embargo, si se siente bien y no tiene Paediatric nurse, no es necesario que nos devuelva la llamada.  Asumiremos que ha regresado a sus actividades diarias normales sin incidentes. Si se le tomaron algunas biopsias, le contactaremos por telfono o por carta en las prximas 3 semanas.  Si no ha sabido [employer] biopsias en el transcurso de 3 semanas, por favor llmenos al [phone].   FIRMAS/CONFIDENCIALIDAD: Usted y/o el acompaante que le cuide han firmado documentos que se ingresarn en su historial mdico electrnico.  Estas firmas atestiguan el hecho  de que la informacin anterior  

## 2018-02-17 NOTE — Progress Notes (Signed)
Called to room to assist during endoscopic procedure.  Patient ID and intended procedure confirmed with present staff. Received instructions for my participation in the procedure from the performing physician.  

## 2018-02-17 NOTE — Progress Notes (Signed)
Patient assisted by interpretor Erick Blinks with [facility]

## 2018-02-17 NOTE — Op Note (Addendum)
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 The patient's tolerance of                            previous anesthesia was also reviewed. The risks                            and benefits of the procedure and the sedation                            options and risks were discussed with the patient.                            All questions were answered, and informed consent                            was obtained. Prior Anticoagulants: The patient has                            taken no previous anticoagulant or antiplatelet                            agents. ASA Grade Assessment: II - A patient with  mild systemic disease. After reviewing the risks                            and benefits, the patient was deemed in                            satisfactory condition to undergo the procedure.                           After obtaining informed consent, the colonoscope                            was passed under direct vision. Throughout the                            procedure, the patient's blood pressure,  pulse, and                            oxygen saturations were monitored continuously. The                            Colonoscope was introduced through the anus and                            advanced to the the ileocolonic anastomosis. The                            colonoscopy was somewhat difficult due to                            post-surgical anatomy, significant looping and a                            tortuous colon. Successful completion of the                            procedure was aided by changing the patient's                            position, using manual pressure, withdrawing and                            reinserting the scope, straightening and shortening                            the scope to obtain bowel loop reduction and using                            scope torsion. The patient tolerated the procedure.                            The quality of the bowel preparation was good. Scope In: 12:32:53 PM Scope Out: 12:51:27 PM Scope Withdrawal Time: 0 hours 9 minutes 46 seconds  Total Procedure  Duration: 0 hours 18 minutes 34 seconds  Findings:                 The digital rectal exam findings include                            non-thrombosed external hemorrhoids. Pertinent                            negatives include no palpable rectal lesions.                           There was evidence of a prior functional end-to-end                            colo-colonic anastomosis in the ascending colon.                            This was characterized by healthy appearing mucosa.                           Two sessile polyps were found in the recto-sigmoid                            colon and transverse colon. The polyps were 2 to 4                            mm in size. These polyps were removed with a cold                            snare. Resection and retrieval were complete.                           Normal mucosa was found in the entire colon                             otherwise.                           Non-bleeding non-thrombosed external and internal                            hemorrhoids were found during retroflexion, during                            perianal exam and during digital exam. The                            hemorrhoids were Grade II (internal hemorrhoids                            that prolapse but reduce spontaneously). Complications:            No immediate complications. Estimated Blood Loss:     Estimated blood loss was minimal. Impression:               -  Non-thrombosed external hemorrhoids found on                            digital rectal exam.                           - Functional end-to-end colo-colonic anastomosis,                            characterized by healthy appearing mucosa.                           - Two 2 to 4 mm polyps at the recto-sigmoid colon                            and in the transverse colon, removed with a cold                            snare. Resected and retrieved.                           - Normal mucosa in the entire examined colon                            otherwise.                           - Non-bleeding non-thrombosed external and internal                            hemorrhoids. Recommendation:           - The patient will be observed post-procedure,                            until all discharge criteria are met.                           - Discharge patient to home.                           - Patient has a contact number available for                            emergencies. The signs and symptoms of potential                            delayed complications were discussed with the                            patient. Return to normal activities tomorrow.                            Written discharge instructions were provided to the  patient.                           - Resume previous diet.                           - Continue present medications.                            - Await pathology results.                           - Repeat colonoscopy in 1 year for surveillance due                            to history of Variant Lynch Syndrome (no matter the                            results of the polyp tissue).                           - Based on current guidelines, an upper endoscopy                            should be considered every 2-3 years. There is also                            consideration of possible small bowel evaluation                            with capsule endoscopy. Will need to discuss with                            patient in clinic about consideration of these                            procedures (EGD definetly and VCE most likely if                            decide to evaluate for IDA).                           - The findings and recommendations were discussed                            with the patient.                           - The findings and recommendations were discussed                            with the designated responsible adult. Justice Britain, MD 02/17/2018 1:00:39 PM Addendum Number: 1   Addendum Date: 02/17/2018 3:59:56 PM      A pediatric colonoscope was used to perform this procedure. Her sigmoid  colon is significantly tortuous but due to her prior surgery she has       significant looping. May be reasonable to consider repeat procedures       with pediatric colonoscope but if issues of looping could consider adult       colonoscope. Justice Britain, MD 02/17/2018 4:00:53 PM

## 2018-02-17 NOTE — Progress Notes (Signed)
Pt's states no medical or surgical changes since previsit or office visit. 

## 2018-02-17 NOTE — Progress Notes (Signed)
A/ox3 pleased with MAC, report to RN 

## 2018-02-18 ENCOUNTER — Telehealth: Payer: Self-pay

## 2018-02-18 NOTE — Telephone Encounter (Signed)
Attempted to reach pt. With follow-up call following endoscopic procedure 02/17/2018.  LM on pt. Voice mail.  Will try to reach pt. Again later today.

## 2018-02-18 NOTE — Telephone Encounter (Signed)
Second post procedure follow up call. Client answered phone, she said she did not speak english and no one was with her to help her. Unable to ask questions

## 2018-02-21 ENCOUNTER — Encounter: Payer: Self-pay | Admitting: Gastroenterology

## 2018-03-03 ENCOUNTER — Other Ambulatory Visit: Payer: Self-pay

## 2018-03-03 ENCOUNTER — Telehealth: Payer: Self-pay | Admitting: Internal Medicine

## 2018-03-03 DIAGNOSIS — E119 Type 2 diabetes mellitus without complications: Secondary | ICD-10-CM

## 2018-03-03 MED ORDER — METFORMIN HCL 1000 MG PO TABS: 1000.0000 mg | ORAL_TABLET | Freq: Two times a day (BID) | ORAL | 11 refills | Status: DC

## 2018-03-03 NOTE — Telephone Encounter (Signed)
Please notify patient Rx sent to [facility] on [address]

## 2018-03-03 NOTE — Telephone Encounter (Signed)
Patient informed. 

## 2018-03-03 NOTE — Telephone Encounter (Signed)
Patient called requesting Rx on Metformin  1000 Mg tablet.  Please advise.

## 2018-04-15 ENCOUNTER — Ambulatory Visit: Payer: Self-pay | Admitting: Physician Assistant

## 2018-05-05 ENCOUNTER — Encounter: Payer: Self-pay | Admitting: Internal Medicine

## 2018-05-05 ENCOUNTER — Ambulatory Visit: Payer: Self-pay | Admitting: Internal Medicine

## 2018-05-05 VITALS — BP 130/80 | HR 78 | Resp 12 | Ht 59.0 in | Wt 159.0 lb

## 2018-05-05 DIAGNOSIS — E782 Mixed hyperlipidemia: Secondary | ICD-10-CM

## 2018-05-05 DIAGNOSIS — I10 Essential (primary) hypertension: Secondary | ICD-10-CM

## 2018-05-05 DIAGNOSIS — R609 Edema, unspecified: Secondary | ICD-10-CM

## 2018-05-05 DIAGNOSIS — E119 Type 2 diabetes mellitus without complications: Secondary | ICD-10-CM

## 2018-05-05 LAB — GLUCOSE, POCT (MANUAL RESULT ENTRY): POC Glucose: 119 mg/dl — AB (ref 70–99)

## 2018-05-05 MED ORDER — HYDROCHLOROTHIAZIDE 25 MG PO TABS: 25.0000 mg | ORAL_TABLET | Freq: Every day | ORAL | 11 refills | Status: DC

## 2018-05-05 MED ORDER — LISINOPRIL 5 MG PO TABS: ORAL_TABLET | ORAL | 11 refills | Status: DC

## 2018-05-05 NOTE — Progress Notes (Signed)
   Subjective:   Patient ID: Laura Douglas, female    DOB: [birth year], 57 y.o.   MRN: [id number]  HPI  1.  DM:  States her sugars are running 120 to 130.  Checking in the morning and in the evening, the latter sometime after dinner.  Not clear how long after meal.   2.  Elevated LDL:  Last LDL 96 in July.  Was to have labs today, but is nonfasting as would expect this time of day.   She feels she is eating well and is physically active.    3.  LE edema: She does not feel the compression stockings have helped.  Her legs are still swollen. States she is elevating feet at night.   She is not limiting salt as discussed.  Taking HCTZ 12.5 mg daily.   4.  Lynch Syndrome:  Had colonoscopy in September and 2 tubular adenomas found without dysplasia  Plan to have another exam in 1 year.  5.  Skin Lesion:  [place] card expired and has applied and been accepted for [employer].  She seems to think most will be covered.  She did not get a slot with Dermatology  Current Meds  Medication Sig  . acetaminophen (TYLENOL) 325 MG tablet Take 650 mg by mouth as needed. Reported on 06/03/2015  . atorvastatin (LIPITOR) 40 MG tablet 1 tab by mouth daily with evening meal  . Blood Glucose Monitoring Suppl (AGAMATRIX PRESTO) w/Device KIT Check sugars before meals twice sugars  . glipiZIDE (GLUCOTROL) 5 MG tablet 1/2 tab by mouth twice daily with meals  . glucose blood (AGAMATRIX PRESTO TEST) test strip Check sugars twice daily before meals  . hydrochlorothiazide (HYDRODIURIL) 12.5 MG tablet Take 1 tablet (12.5 mg total) by mouth daily.  Marland Kitchen lisinopril (PRINIVIL,ZESTRIL) 2.5 MG tablet Take 1 tablet (2.5 mg total) by mouth daily.  . metFORMIN (GLUCOPHAGE) 1000 MG tablet Take 1 tablet (1,000 mg total) by mouth 2 (two) times daily with a meal.    Review of Systems    Objective:  Physical Exam  NAD HEENT:  1 cm pink superficially umbilicated lesion, right lateral zygomatic arch area of face/skin Neck:   Supple, No adenopathy, no thyromegaly Chest:  CTA CV:  RRR without murmur or rub.  Radial and DP pulses normal and equal. LE: Ankles with thickening, but no pitting.   Assessment & Plan:  1.  DM:  Not clear how well control is.  A1C with fasting labs in next 1-2 weeks Pneumococcal vaccine 23 v not available currently. Discussed could get at [facility].  2.  Elevated LDL:  FLP in 1-2 weeks.  3.  Borderline BP:  Increase HCTZ to 25 mg and Lisinopril to 5 mg daily.  BP check with fasting labs in 1-2 weeks.    4.  LE edema:  Increase HCTZ as above.  5.  Skin lesion, left face at zygomatic arch.  Needs evaluation with Dermatology.  She is to check her [employer] coverage to see if she is covered.

## 2018-05-19 ENCOUNTER — Other Ambulatory Visit: Payer: Self-pay

## 2018-05-19 VITALS — BP 122/78 | HR 88

## 2018-05-19 DIAGNOSIS — E119 Type 2 diabetes mellitus without complications: Secondary | ICD-10-CM

## 2018-05-19 DIAGNOSIS — I10 Essential (primary) hypertension: Secondary | ICD-10-CM

## 2018-05-19 DIAGNOSIS — E782 Mixed hyperlipidemia: Secondary | ICD-10-CM

## 2018-05-19 DIAGNOSIS — Z23 Encounter for immunization: Secondary | ICD-10-CM

## 2018-05-19 MED ORDER — ATORVASTATIN CALCIUM 40 MG PO TABS: ORAL_TABLET | ORAL | 2 refills | Status: DC

## 2018-05-19 NOTE — Progress Notes (Signed)
Patient BP in normal range. Informed to continue current dose of medication. Patient verbalized understanding. Patient states swelling in legs and feet are much better as well.

## 2018-05-20 LAB — LIPID PANEL W/O CHOL/HDL RATIO
CHOLESTEROL TOTAL: 257 mg/dL — AB (ref 100–199)
HDL: 46 mg/dL (ref >39)
LDL Calculated: 172 mg/dL — ABNORMAL HIGH (ref 0–99)
Triglycerides: 193 mg/dL — ABNORMAL HIGH (ref 0–149)
VLDL Cholesterol Cal: 39 mg/dL (ref 5–40)

## 2018-05-20 LAB — HGB A1C W/O EAG: Hgb A1c MFr Bld: 7.2 % — ABNORMAL HIGH (ref 4.8–5.6)

## 2018-08-15 ENCOUNTER — Other Ambulatory Visit (INDEPENDENT_AMBULATORY_CARE_PROVIDER_SITE_OTHER): Payer: BLUE CROSS/BLUE SHIELD

## 2018-08-15 DIAGNOSIS — E782 Mixed hyperlipidemia: Secondary | ICD-10-CM | POA: Diagnosis not present

## 2018-08-15 DIAGNOSIS — E119 Type 2 diabetes mellitus without complications: Secondary | ICD-10-CM

## 2018-08-15 DIAGNOSIS — Z79899 Other long term (current) drug therapy: Secondary | ICD-10-CM

## 2018-08-15 DIAGNOSIS — Z7251 High risk heterosexual behavior: Secondary | ICD-10-CM

## 2018-08-15 DIAGNOSIS — Z1159 Encounter for screening for other viral diseases: Secondary | ICD-10-CM

## 2018-08-16 LAB — HIV ANTIBODY (ROUTINE TESTING W REFLEX): HIV Screen 4th Generation wRfx: NONREACTIVE

## 2018-08-16 LAB — HEPATIC FUNCTION PANEL
ALBUMIN: 4.4 g/dL (ref 3.8–4.9)
ALT: 36 IU/L — ABNORMAL HIGH (ref 0–32)
AST: 30 IU/L (ref 0–40)
Alkaline Phosphatase: 56 IU/L (ref 39–117)
BILIRUBIN TOTAL: 0.4 mg/dL (ref 0.0–1.2)
Bilirubin, Direct: 0.12 mg/dL (ref 0.00–0.40)
Total Protein: 6.7 g/dL (ref 6.0–8.5)

## 2018-08-16 LAB — HGB A1C W/O EAG: HEMOGLOBIN A1C: 7.9 % — AB (ref 4.8–5.6)

## 2018-08-16 LAB — LIPID PANEL W/O CHOL/HDL RATIO
CHOLESTEROL TOTAL: 144 mg/dL (ref 100–199)
HDL: 44 mg/dL (ref >39)
LDL CALC: 68 mg/dL (ref 0–99)
Triglycerides: 160 mg/dL — ABNORMAL HIGH (ref 0–149)
VLDL Cholesterol Cal: 32 mg/dL (ref 5–40)

## 2018-08-16 LAB — HEPATITIS C ANTIBODY: Hep C Virus Ab: <0.1 s/co ratio (ref 0.0–0.9)

## 2018-08-18 ENCOUNTER — Encounter: Payer: Self-pay | Admitting: Internal Medicine

## 2018-08-18 ENCOUNTER — Ambulatory Visit: Payer: BLUE CROSS/BLUE SHIELD | Admitting: Internal Medicine

## 2018-08-18 VITALS — BP 124/82 | HR 86 | Resp 12 | Ht 59.0 in | Wt 158.0 lb

## 2018-08-18 DIAGNOSIS — Z0000 Encounter for general adult medical examination without abnormal findings: Secondary | ICD-10-CM

## 2018-08-18 DIAGNOSIS — Z1509 Genetic susceptibility to other malignant neoplasm: Secondary | ICD-10-CM | POA: Diagnosis not present

## 2018-08-18 DIAGNOSIS — Z1239 Encounter for other screening for malignant neoplasm of breast: Secondary | ICD-10-CM | POA: Diagnosis not present

## 2018-08-18 DIAGNOSIS — L989 Disorder of the skin and subcutaneous tissue, unspecified: Secondary | ICD-10-CM

## 2018-08-18 NOTE — Progress Notes (Signed)
Subjective:    Patient ID: Laura Douglas, female   DOB: 13-Oct-1960, 58 y.o.   MRN: [id number]   HPI   CPE with pap  1.  Pap:  Diagnosed with endometrial cancer of the uterus in 07/2014.  Underwent Robotic Assisted total laparoscopic hysterectomy-BSO with lymph node dissiction in 08/2014.    Her final pathologic diagnosis was a Stage IA Grade 1  endometrial cancer with no lymphovascular space invasion, 0 mm of myometrial invasion and negative lymph nodes.  History is notable for colon cancer 2009. MSH2/MSH6 LOH on IHC.  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  She states she did not know she was to follow up. Paternal aunt with cervical cancer.  Died.  Uncertain age. Sister with uterine and possibly cervical cancer at 57 years of age.  Still living  2.  Mammogram:  Last mammogram performed 07/13/17 6 month follow up for left breast pain.  Normal.  No family history of breast cancer.  3.  Osteoprevention:  Likes milk, but does not drink daily.  She would be willing to drink 3 cups of 2% milk daily.  She does not do anything physically active daily.  Tired from work.    4.  Guaiac Cards: Not indicated.    5.  Colonoscopy:  Colonoscopy performed 02/2018 with findings of 2 transverse colon tubular adenomas without dysplasia and plans for repeat in a year.  Patient with history of colon cancer in 2009.  Sister who also had endometrial cancer also with this diagnosis and surgery at age 96.  49.  Immunizations:  Has not had pneumococcal 23 valent vaccine.  Has not checked with [facility] vaccine program. Immunization History  Administered Date(s) Administered  . Influenza Inj Mdck Quad Pf 05/19/2018  . Influenza,inj,Quad PF,6+ Mos 09/12/2014, 06/03/2015  . Tdap 01/06/2018    7.  Glucose/Cholesterol:  A1C continues to climb this past year.  Now at 7.9%.   Cholesterol panel almost at goal after starting Atorvastatin. Lipid Panel       Component Value Date/Time   CHOL 144 08/15/2018 0941   TRIG 160 (H) 08/15/2018 0941   HDL 44 08/15/2018 0941   CHOLHDL 6.7 (H) 06/04/2015 0925   VLDL 67 (H) 06/04/2015 0925   LDLCALC 68 08/15/2018 0941   She has been eating a lot and is not physically active after work.    Current Meds  Medication Sig  . acetaminophen (TYLENOL) 325 MG tablet Take 650 mg by mouth as needed. Reported on 06/03/2015  . atorvastatin (LIPITOR) 40 MG tablet 1 tab by mouth daily with evening meal  . Blood Glucose Monitoring Suppl (AGAMATRIX PRESTO) w/Device KIT Check sugars before meals twice sugars  . glipiZIDE (GLUCOTROL) 5 MG tablet 1/2 tab by mouth twice daily with meals  . glucose blood (AGAMATRIX PRESTO TEST) test strip Check sugars twice daily before meals  . hydrochlorothiazide (HYDRODIURIL) 25 MG tablet Take 1 tablet (25 mg total) by mouth daily.  Marland Kitchen lisinopril (PRINIVIL,ZESTRIL) 5 MG tablet 1 tab by mouth daily with HCTZ  . metFORMIN (GLUCOPHAGE) 1000 MG tablet Take 1 tablet (1,000 mg total) by mouth 2 (two) times daily with a meal.   No Known Allergies  Past Medical History:  Diagnosis Date  . Colon cancer [facility])    possible? Patient had surgery in [place]. Denies needing adjuvant therapy. The surgery "removed a tumor from the colon".  . Diabetes mellitus without complication ([place])   .  Family history of uterine cancer   . Hypertension   . Obesity   . Uterine cancer ([facility]) 2016   MSH2/MSH6 LOH on IHC    Past Surgical History:  Procedure Laterality Date  . ABDOMINAL HYSTERECTOMY    . abortions     twice   . arm surgery     left arm  . COLON SURGERY    . COLON SURGERY  2010   tumor removal  . FRACTURE SURGERY  2006   Lt lower arm  . MULTIPLE TOOTH EXTRACTIONS    . ROBOTIC ASSISTED TOTAL HYSTERECTOMY WITH BILATERAL SALPINGO OOPHERECTOMY Bilateral 09/11/2014   Procedure: ROBOTIC ASSISTED TOTAL LAPAROSCOPIC HYSTERECTOMY WITH BILATERAL SALPINGO OOPHORECTOMY LYMPH NODE DISSECTION; LYSIS OF  ADHESIONS;  Surgeon: Janie Morning, MD;  Location: [facility];  Service: Gynecology;  Laterality: Bilateral;  . TUBAL LIGATION  1993    Family History  Problem Relation Age of Onset  . Prostate cancer Father   . Diabetes Mother   . Uterine cancer Sister 35  . Cancer Sister        colon cancer as well age 21?  Marland Kitchen Colon cancer Sister   . Uterine cancer Maternal Aunt   . Alcohol abuse Brother   . Cirrhosis Brother        cause of death  . Breast cancer Neg Hx     Social History   Socioeconomic History  . Marital status: Single    Spouse name: Not on file  . Number of children: 4  . Years of education: Not on file  . Highest education level: Not on file  Occupational History  . Occupation: [employer] work.  Social Needs  . Financial resource strain: Not on file  . Food insecurity:    Worry: Not on file    Inability: Not on file  . Transportation needs:    Medical: Not on file    Non-medical: Not on file  Tobacco Use  . Smoking status: Never Smoker  . Smokeless tobacco: Never Used  Substance and Sexual Activity  . Alcohol use: No  . Drug use: No  . Sexual activity: Yes    Birth control/protection: Surgical  Lifestyle  . Physical activity:    Days per week: 0 days    Minutes per session: 0 min  . Stress: Only a little  Relationships  . Social connections:    Talks on phone: More than three times a week    Gets together: Three times a week    Attends religious service: Never    Active member of club or organization: No    Attends meetings of clubs or organizations: Never    Relationship status: Living with partner  . Intimate partner violence:    Fear of current or ex partner: No    Emotionally abused: No    Physically abused: No    Forced sexual activity: No  Other Topics Concern  . Not on file  Social History Narrative   Lives at home with her female partner   Only son killed by gang violence in [place].     Review of Systems  Constitutional: Negative for  fatigue.  HENT: Positive for dental problem (her insurance does not cover dental.). Negative for ear pain, hearing loss, sinus pressure, sneezing and sore throat.   Eyes: Negative for visual disturbance.       Pterygium, left eye.   Was seen by optometry. She is unaware of when she was to return for followup.  Respiratory:  Negative for cough and shortness of breath.   Cardiovascular: Positive for leg swelling (only the days when she works.  She states she stays away from salt.  She does elevate her legs.   ). Negative for chest pain and palpitations.  Gastrointestinal: Negative for abdominal pain, blood in stool (No melena), constipation and diarrhea.  Genitourinary: Positive for frequency (drinks a lot of water thoughout day.). Negative for dysuria and vaginal discharge.  Musculoskeletal: Positive for arthralgias (right leg hurts right groin area.  Cramp feeling.).  Skin:       Lesion right maxillary area.  We have been unable to send her for Derm evaluation until when she obtained insurance.   Has another at right temple behind hairline. Both itch and burn. Pain of left MTP at times.  Neurological: Positive for numbness (Right leg feels numb when it swells.). Negative for weakness.  Psychiatric/Behavioral: Negative for dysphoric mood. The patient is not nervous/anxious.       Objective:   BP 124/82 (BP Location: Left Arm, Patient Position: Sitting, Cuff Size: Normal)   Pulse 86   Resp 12   Ht '4\' 11"'  (1.499 m)   Wt 158 lb (71.7 kg)   LMP  (LMP Unknown)   BMI 31.91 kg/m   Physical Exam  Constitutional: She is oriented to person, place, and time. She appears well-developed and well-nourished.  HENT:  Head: Normocephalic and atraumatic.  Right Ear: Hearing, tympanic membrane, external ear and ear canal normal.  Left Ear: Hearing, tympanic membrane, external ear and ear canal normal.  Nose: Nose normal.  Mouth/Throat: Uvula is midline, oropharynx is clear and moist and mucous  membranes are normal.  Eyes: Pupils are equal, round, and reactive to light. Conjunctivae and EOM are normal.  Pterygium, left eye.  No inflammation. Discs sharp bilaterally.  Neck: Normal range of motion and full passive range of motion without pain. Neck supple. No thyromegaly present.  Cardiovascular: Normal rate, regular rhythm, S1 normal and S2 normal. Exam reveals no S3, no S4 and no friction rub.  No murmur heard. No carotid bruits.  Carotid, radial, femoral, DP and PT pulses normal and equal.   Pulmonary/Chest: Effort normal and breath sounds normal. Right breast exhibits no inverted nipple, no mass, no nipple discharge, no skin change and no tenderness. Left breast exhibits no inverted nipple, no mass, no nipple discharge, no skin change and no tenderness.  Abdominal: Soft. Bowel sounds are normal. She exhibits no mass. There is no hepatosplenomegaly. There is no abdominal tenderness. No hernia.  Genitourinary:    Genitourinary Comments: Normal external female genitalia.   Rectal:  No mass, heme negative brown stool.       Musculoskeletal: Normal range of motion.  Lymphadenopathy:       Head (right side): No submental and no submandibular adenopathy present.       Head (left side): No submental and no submandibular adenopathy present.    She has no cervical adenopathy.    She has no axillary adenopathy.       Right: No inguinal and no supraclavicular adenopathy present.       Left: No inguinal and no supraclavicular adenopathy present.  Neurological: She is alert and oriented to person, place, and time. She has normal strength and normal reflexes. No cranial nerve deficit or sensory deficit. Coordination and gait normal.  Skin: Skin is warm. No rash noted.  Flaking lesions of right temple, left frontotemporal scalp and mid upper back--all itch, flake and bleed.  Psychiatric: She has a normal mood and affect. Her speech is normal and behavior is normal. Judgment and thought content  normal. Cognition and memory are normal.     Assessment & Plan   1.  CPE Mammogram Encouraged to check with insurance to obtain pneumococcal 23 v vaccine at pharmacy vs.  [facility] until we have these back in stock.  2.  Lynch variant with + endometrial cancer and colon cancer. Repeat colonoscopy later this year for findings of tubular adenomas on colonoscopy in 2019.  3.  Skin lesions:  Referral to Dermatology for evaluation and treatment and to set up with annual skin surveillance.  4.  DM/hypertension/Hyperlipidemia:  To get started back on improved diet and physical activity.  Repeat labs in 4 months.  If no improvement, will need medication additions.  5  Urine frequency:  UA cytology.

## 2018-08-19 LAB — URINALYSIS, ROUTINE W REFLEX MICROSCOPIC
Bilirubin, UA: NEGATIVE
GLUCOSE, UA: NEGATIVE
Ketones, UA: NEGATIVE
Leukocytes, UA: NEGATIVE
NITRITE UA: NEGATIVE
Protein, UA: NEGATIVE
RBC, UA: NEGATIVE
Specific Gravity, UA: <=1.005 — AB (ref 1.005–1.030)
Urobilinogen, Ur: 0.2 mg/dL (ref 0.2–1.0)
pH, UA: 6 (ref 5.0–7.5)

## 2018-08-31 ENCOUNTER — Other Ambulatory Visit: Payer: Self-pay | Admitting: Internal Medicine

## 2018-08-31 DIAGNOSIS — Z1231 Encounter for screening mammogram for malignant neoplasm of breast: Secondary | ICD-10-CM

## 2018-09-01 ENCOUNTER — Other Ambulatory Visit: Payer: Self-pay | Admitting: Internal Medicine

## 2018-10-03 ENCOUNTER — Ambulatory Visit: Payer: Self-pay

## 2018-10-18 ENCOUNTER — Encounter: Payer: Self-pay | Admitting: Gastroenterology

## 2018-11-01 ENCOUNTER — Ambulatory Visit (INDEPENDENT_AMBULATORY_CARE_PROVIDER_SITE_OTHER): Payer: BLUE CROSS/BLUE SHIELD | Admitting: Gastroenterology

## 2018-11-01 ENCOUNTER — Other Ambulatory Visit: Payer: Self-pay

## 2018-11-01 VITALS — Ht 59.0 in | Wt 158.0 lb

## 2018-11-01 DIAGNOSIS — L989 Disorder of the skin and subcutaneous tissue, unspecified: Secondary | ICD-10-CM | POA: Diagnosis not present

## 2018-11-01 DIAGNOSIS — Z86010 Personal history of colonic polyps: Secondary | ICD-10-CM

## 2018-11-01 DIAGNOSIS — Z85038 Personal history of other malignant neoplasm of large intestine: Secondary | ICD-10-CM | POA: Diagnosis not present

## 2018-11-01 DIAGNOSIS — Z1509 Genetic susceptibility to other malignant neoplasm: Secondary | ICD-10-CM | POA: Diagnosis not present

## 2018-11-01 NOTE — Patient Instructions (Addendum)
If you are age 57 or older, your body mass index should be between 23-30. Your Body mass index is 31.91 kg/m. If this is out of the aforementioned range listed, please consider follow up with your Primary Care Provider.  If you are age 51 or younger, your body mass index should be between 19-25. Your Body mass index is 31.91 kg/m. If this is out of the aformentioned range listed, please consider follow up with your Primary Care Provider.   Your provider has requested that you go to the basement level for lab work before leaving today. Press "B" on the elevator. The lab is located at the first door on the left as you exit the elevator.  It has been recommended to you by your physician that you have a(n) Endo/Colon [place] completed in September. We did not schedule the procedure(s) today. Please contact our office at [phone] should you decide to have the procedure completed.  We will give you a call  at a later time to get you scheduled for procedures in Sept 2020.   We have sent a referral to Dermatology- Dr.Drew Ronnald Ramp at [facility]. They will contact you regarding referral for Facial Lesion.   Thank you for choosing me and [facility].  Dr. Rush Landmark

## 2018-11-01 NOTE — Progress Notes (Signed)
[facility] VISIT   Primary Care Provider Mack Hook, MD [place] [address] [phone]   Patient Profile: Laura Douglas is a 58 y.o. female with a pmh significant for variant of Lynch syndrome, colon cancer status post resection, diabetes, hypertension, obesity, uterine cancer, colon polyps.  The patient presents to the [facility] for an evaluation and management of problem(s) noted below:  Problem List 1. Lynch syndrome   2. History of colon cancer   3. History of colonic polyps   4. Facial lesion     History of Present Illness I connected with  Laura Douglas on 11/02/18. I verified that I was speaking with the correct person using two identifiers. Due to the COVID-19 Pandemic, this service was provided via telemedicine using audio only.   Interactive audio and video telecommunications were attempted between this provider and patient, however failed, as the patient did not have access to video capability and thus to provide timely and excellent care, we continued and completed visit with audio only. The patient was located at home. The provider was located in the office. The patient did consent to this visit and is aware of charges through their insurance as well as the limitations of evaluation and management by telemedicine. Other persons participating in this telemedicine service were interpreter services [phone]). Time spent on visit was greater than 25 minutes on the phone and in coordination of care.  I met this patient in September of last year in the setting of a high risk colon cancer surveillance for history of Lynch syndrome and prior colon cancer status post resection.  We found a few polyps but otherwise the patient was doing very well.  We had wanted to see the patient back in 6 months for which today is her follow-up visit although it is now been 8 months since the  colonoscopy.  The patient has done well since her colonoscopy and describes no issues of abdominal pain or GI upset or GI bleeding (melena or hematochezia).  The patient states that greater than 20 years ago while in [place] she had an upper endoscopy but has not had one since.  She denies any dysphagia or odynophagia or dyspepsia symptoms.  She has never had a video capsule endoscopy.  The last hemoglobin in our charts was normal but is back in 2018.  She denies having a history of anemia or being on iron supplementation.  She has 1-2 formed bowel movements per day which is normal for her.    GI Review of Systems Positive as above Negative for pyrosis, dysphagia, odynophagia, change in appetite, early satiety, change in bowel habits  Review of Systems General: Denies fevers/chills/weight loss HEENT: Denies oral lesions Cardiovascular: Denies chest pain Pulmonary: Denies shortness of breath Gastroenterological: See HPI Genitourinary: Denies darkened urine Hematological: Denies easy bruising/bleeding Endocrine: Denies temperature intolerance Dermatological: Denies jaundice Psychological: Mood is stable   Medications Current Outpatient Medications  Medication Sig Dispense Refill   acetaminophen (TYLENOL) 325 MG tablet Take 650 mg by mouth as needed. Reported on 06/03/2015     atorvastatin (LIPITOR) 40 MG tablet TAKE 1 TABLET BY MOUTH ONCE DAILY WITH EVENING MEAL 30 tablet 11   Blood Glucose Monitoring Suppl (AGAMATRIX PRESTO) w/Device KIT Check sugars before meals twice sugars 1 kit 0   glipiZIDE (GLUCOTROL) 5 MG tablet 1/2 tab by mouth twice daily with meals 30 tablet 11   glucose blood (AGAMATRIX PRESTO TEST) test strip Check sugars  twice daily before meals 100 each 12   hydrochlorothiazide (HYDRODIURIL) 25 MG tablet Take 1 tablet (25 mg total) by mouth daily. 30 tablet 11   lisinopril (PRINIVIL,ZESTRIL) 5 MG tablet 1 tab by mouth daily with HCTZ 30 tablet 11   metFORMIN  (GLUCOPHAGE) 1000 MG tablet Take 1 tablet (1,000 mg total) by mouth 2 (two) times daily with a meal. 60 tablet 11   No current facility-administered medications for this visit.     Allergies No Known Allergies  Histories Past Medical History:  Diagnosis Date   Colon cancer ([facility])    possible? Patient had surgery in [place]. Denies needing adjuvant therapy. The surgery "removed a tumor from the colon".   Diabetes mellitus without complication ([place])    Family history of uterine cancer    Hypertension    Obesity    Uterine cancer ([facility]) 2016   MSH2/MSH6 LOH on IHC   Past Surgical History:  Procedure Laterality Date   ABDOMINAL HYSTERECTOMY     abortions     twice    arm surgery     left arm   COLON SURGERY     COLON SURGERY  2010   tumor removal   FRACTURE SURGERY  2006   Lt lower arm   MULTIPLE TOOTH EXTRACTIONS     ROBOTIC ASSISTED TOTAL HYSTERECTOMY WITH BILATERAL SALPINGO OOPHERECTOMY Bilateral 09/11/2014   Procedure: ROBOTIC ASSISTED TOTAL LAPAROSCOPIC HYSTERECTOMY WITH BILATERAL SALPINGO OOPHORECTOMY LYMPH NODE DISSECTION; LYSIS OF ADHESIONS;  Surgeon: Janie Morning, MD;  Location: [facility];  Service: Gynecology;  Laterality: Bilateral;   TUBAL LIGATION  1993   Social History   Socioeconomic History   Marital status: Single    Spouse name: Not on file   Number of children: 4   Years of education: Not on file   Highest education level: Not on file  Occupational History   Occupation: [employer] work.  Social Designer, fashion/clothing strain: Not on file   Food insecurity:    Worry: Not on file    Inability: Not on file   Transportation needs:    Medical: Not on file    Non-medical: Not on file  Tobacco Use   Smoking status: Never Smoker   Smokeless tobacco: Never Used  Substance and Sexual Activity   Alcohol use: No   Drug use: No   Sexual activity: Yes    Birth control/protection: Surgical  Lifestyle   Physical activity:     Days per week: 0 days    Minutes per session: 0 min   Stress: Only a little  Relationships   Social connections:    Talks on phone: More than three times a week    Gets together: Three times a week    Attends religious service: Never    Active member of club or organization: No    Attends meetings of clubs or organizations: Never    Relationship status: Living with partner   Intimate partner violence:    Fear of current or ex partner: No    Emotionally abused: No    Physically abused: No    Forced sexual activity: No  Other Topics Concern   Not on file  Social History Narrative   Lives at home with her female partner   Only son killed by gang violence in [place].   Family History  Problem Relation Age of Onset   Prostate cancer Father    Diabetes Mother    Uterine cancer Sister 37  Cancer Sister        colon cancer as well age 33?   Colon cancer Sister    Uterine cancer Maternal Aunt    Alcohol abuse Brother    Cirrhosis Brother        cause of death   Breast cancer Neg Hx    I have reviewed her medical, social, and family history in detail and updated the electronic medical record as necessary.    PHYSICAL EXAMINATION  Telehealth visit   REVIEW OF DATA  I reviewed the following data at the time of this encounter:  GI Procedures and Studies  September 2019 colonoscopy - Non-thrombosed external hemorrhoids found on digital rectal exam. - Functional end-to-end colo-colonic anastomosis, characterized by healthy appearing mucosa. - Two 2 to 4 mm polyps at the recto-sigmoid colon and in the transverse colon, removed with a cold snare. Resected and retrieved. - Normal mucosa in the entire examined colon otherwise. - Non-bleeding non-thrombosed external and internal hemorrhoids. Pathology consistent with tubular adenomas  Laboratory Studies  Reviewed in epic  Imaging Studies  No relevant imaging studies to review   ASSESSMENT  Ms. Tauzin  is a 58 y.o. female with a pmh significant for variant of Lynch syndrome, colon cancer status post resection, diabetes, hypertension, obesity, uterine cancer, colon polyps.  The patient is seen today for evaluation and management of:  1. Lynch syndrome   2. History of colon cancer   3. History of colonic polyps   4. Facial lesion    The patient is clinically and hemodynamically stable.  She has variant Lynch syndrome.  She will have continuation of colonoscopies on a yearly basis.  We discussed the role of upper endoscopic evaluation for possibility of upper GI cancers which although are rare had have been associated with patients who have Lynch.  The guidelines are not clear whether this should occur every 2 to 3 years but since we have not had one done in over 20 years I think it is reasonable to further evaluate with an upper endoscopy at the time of her next colonoscopy.  Were going to get labs including iron indices and unless the patient has evidence of iron deficiency anemia we will hold on video capsule endoscopy.  The patient should continue to follow-up with her primary care provider if she needs follow-up in regards to oncologic screening from a female genitourinary tract perspective.  She should also be evaluated by dermatology on a 1 to 2-year basis.  She is describing a new facial lesion on the right side of her face she thought was acne but is continued to be present.  As such we will place a referral to dermatology for further evaluation of the lesion and for a full dermatologic skin exam.  All patient questions were answered, to the best of my ability, and the patient agrees to the aforementioned plan of action with follow-up as indicated.   PLAN  Laboratories as outlined below to be done in the next few weeks EGD/colonoscopy for screening purposes and surveillance in September of this year unless patient starts to develop new GI symptoms that may require an earlier upper endoscopy If  patient has evidence of iron deficiency or develops that and no source is found on an upper GI tract evaluation as well as repeat colonoscopy then would consider the role of video capsule endoscopy Dermatology referral Follow-up with PCP for other female urogenital cancer screening   Orders Placed This Encounter  Procedures   CBC  IBC + Ferritin   Ambulatory referral to Dermatology    New Prescriptions   No medications on file   Modified Medications   No medications on file    Planned Follow Up No follow-ups on file.   Justice Britain, MD [facility] Advanced Endoscopy Office # [phone]

## 2018-11-02 ENCOUNTER — Encounter: Payer: Self-pay | Admitting: Gastroenterology

## 2018-11-02 DIAGNOSIS — Z1509 Genetic susceptibility to other malignant neoplasm: Secondary | ICD-10-CM | POA: Insufficient documentation

## 2018-11-02 DIAGNOSIS — Z86010 Personal history of colonic polyps: Secondary | ICD-10-CM | POA: Insufficient documentation

## 2018-11-02 DIAGNOSIS — L989 Disorder of the skin and subcutaneous tissue, unspecified: Secondary | ICD-10-CM | POA: Insufficient documentation

## 2018-11-02 DIAGNOSIS — Z85038 Personal history of other malignant neoplasm of large intestine: Secondary | ICD-10-CM | POA: Insufficient documentation

## 2018-11-03 NOTE — Progress Notes (Signed)
FYI:  Dermatology referral written in March just as COVID19 closures starting--also to Dr. Jarome Matin.  We are working on that referral through our system

## 2018-12-19 ENCOUNTER — Other Ambulatory Visit: Payer: BLUE CROSS/BLUE SHIELD

## 2018-12-22 ENCOUNTER — Ambulatory Visit: Payer: BLUE CROSS/BLUE SHIELD | Admitting: Internal Medicine

## 2018-12-26 ENCOUNTER — Other Ambulatory Visit: Payer: Self-pay

## 2018-12-26 ENCOUNTER — Other Ambulatory Visit (INDEPENDENT_AMBULATORY_CARE_PROVIDER_SITE_OTHER): Payer: BLUE CROSS/BLUE SHIELD

## 2018-12-26 DIAGNOSIS — E119 Type 2 diabetes mellitus without complications: Secondary | ICD-10-CM

## 2018-12-26 DIAGNOSIS — E782 Mixed hyperlipidemia: Secondary | ICD-10-CM

## 2018-12-27 LAB — LIPID PANEL W/O CHOL/HDL RATIO
Cholesterol, Total: 124 mg/dL (ref 100–199)
HDL: 38 mg/dL — ABNORMAL LOW (ref >39)
LDL Calculated: 59 mg/dL (ref 0–99)
Triglycerides: 135 mg/dL (ref 0–149)
VLDL Cholesterol Cal: 27 mg/dL (ref 5–40)

## 2018-12-27 LAB — HGB A1C W/O EAG: Hgb A1c MFr Bld: 8.5 % — ABNORMAL HIGH (ref 4.8–5.6)

## 2018-12-29 ENCOUNTER — Telehealth: Payer: Self-pay

## 2018-12-29 ENCOUNTER — Other Ambulatory Visit: Payer: Self-pay

## 2018-12-29 ENCOUNTER — Encounter: Payer: Self-pay | Admitting: Internal Medicine

## 2018-12-29 ENCOUNTER — Ambulatory Visit: Payer: BLUE CROSS/BLUE SHIELD | Admitting: Internal Medicine

## 2018-12-29 VITALS — BP 122/80 | HR 82 | Resp 12 | Ht 59.0 in | Wt 156.0 lb

## 2018-12-29 DIAGNOSIS — E119 Type 2 diabetes mellitus without complications: Secondary | ICD-10-CM

## 2018-12-29 DIAGNOSIS — Z23 Encounter for immunization: Secondary | ICD-10-CM | POA: Diagnosis not present

## 2018-12-29 DIAGNOSIS — L989 Disorder of the skin and subcutaneous tissue, unspecified: Secondary | ICD-10-CM | POA: Diagnosis not present

## 2018-12-29 DIAGNOSIS — E7800 Pure hypercholesterolemia, unspecified: Secondary | ICD-10-CM | POA: Diagnosis not present

## 2018-12-29 DIAGNOSIS — F439 Reaction to severe stress, unspecified: Secondary | ICD-10-CM

## 2018-12-29 NOTE — Progress Notes (Signed)
    Subjective:    Patient ID: Laura Douglas, female   DOB: 05/28/1961, 58 y.o.   MRN: [id number]   HPI   1.  Hyperlipidemia:  Taking Atorvastatin.  Levels recently at goal save for low HDL.    Lipid Panel     Component Value Date/Time   CHOL 124 12/26/2018 0857   TRIG 135 12/26/2018 0857   HDL 38 (L) 12/26/2018 0857   CHOLHDL 6.7 (H) 06/04/2015 0925   VLDL 67 (H) 06/04/2015 0925   LDLCALC 59 12/26/2018 0857   2. DM:  Under a lot of stress/anxious/depressed with changes during pandemic.  Both patient and her husband lost their jobs when pandemic started.   Now both are collecting unemployment and he is back to work.  He is a Nature conservation officer. Until 3 weeks ago, she was quite stressed, not eating as she should and not as physically active.      3.  Right zygomatic arch facial lesion:  Still has not heard from Dr. Jarome Matin, Dermatology for concerning facial lesion.  Not clear if this is due only to difficulties with appt during pandemic.   Current Meds  Medication Sig  . aspirin EC 81 MG tablet Take 81 mg by mouth daily.  Marland Kitchen atorvastatin (LIPITOR) 40 MG tablet TAKE 1 TABLET BY MOUTH ONCE DAILY WITH EVENING MEAL  . Blood Glucose Monitoring Suppl (AGAMATRIX PRESTO) w/Device KIT Check sugars before meals twice sugars  . glipiZIDE (GLUCOTROL) 5 MG tablet 1/2 tab by mouth twice daily with meals  . glucose blood (AGAMATRIX PRESTO TEST) test strip Check sugars twice daily before meals  . hydrochlorothiazide (HYDRODIURIL) 25 MG tablet Take 1 tablet (25 mg total) by mouth daily.  Marland Kitchen lisinopril (PRINIVIL,ZESTRIL) 5 MG tablet 1 tab by mouth daily with HCTZ  . metFORMIN (GLUCOPHAGE) 1000 MG tablet Take 1 tablet (1,000 mg total) by mouth 2 (two) times daily with a meal.   No Known Allergies   Review of Systems    Objective:   BP 122/80 (BP Location: Left Arm, Patient Position: Sitting, Cuff Size: Normal)   Pulse 82   Resp 12   Ht '4\' 11"'$  (1.499 m)   Wt 156 lb (70.8 kg)    LMP  (LMP Unknown)   BMI 31.51 kg/m   Physical Exam  NAD HEENT:  Mildly inflamed, nonhealing superficial appearing lesion at right zygomatic arch and smaller lesion patient shows me today at left temple at hairline.  PERRL, EOMI Neck:  Supple, No adenopathy Chest:  CTA CV:  RRR without murmur or rub.  Radial and DP pulses normal and equal. LE:  No edema.   Assessment & Plan   1.  Hypercholesterolemia:  At goal save for HDL on Atorvastatin.  To work on diet and physical activity now her stress level is down as she and her husband back to work.  2.  DM:  As in #1.  A1C in 3 months to see how she has done with getting back to lifestyle changes.  Will see her back a couple days after labs. Pneumococcal 23 Valent. Influenza vaccine in Sept/Oct  3.  Skin lesions, nonhealing:  Rerefer to Dr. Jarome Matin, Derm now that things are opening up with pandemic.

## 2018-12-29 NOTE — Telephone Encounter (Signed)
Patient insurance is [employer] local which is apart of [facility]. Patient has appointment scheduled with [facility] for 02/10/2019 @ 2:30 pm. Patient needs to take with her ID, co-pay and insurance card. Arrive 15 minutes before appointment. Address is [place], [place] [address]. This is the only office that had openings soon.  To Antony Madura to notify patient. Also they will provide a interpreter for her.

## 2019-01-03 NOTE — Telephone Encounter (Signed)
Patient informed and provided with full information for the appointment; verbalized understanding.

## 2019-02-14 ENCOUNTER — Other Ambulatory Visit: Payer: BLUE CROSS/BLUE SHIELD | Admitting: Licensed Clinical Social Worker

## 2019-03-20 ENCOUNTER — Ambulatory Visit: Payer: Self-pay | Admitting: Physician Assistant

## 2019-03-27 ENCOUNTER — Other Ambulatory Visit: Payer: BLUE CROSS/BLUE SHIELD

## 2019-03-30 ENCOUNTER — Ambulatory Visit: Payer: BLUE CROSS/BLUE SHIELD | Admitting: Internal Medicine

## 2019-04-09 ENCOUNTER — Other Ambulatory Visit: Payer: Self-pay | Admitting: Internal Medicine

## 2019-04-09 DIAGNOSIS — E119 Type 2 diabetes mellitus without complications: Secondary | ICD-10-CM

## 2019-05-15 ENCOUNTER — Other Ambulatory Visit: Payer: Self-pay

## 2019-05-15 MED ORDER — LISINOPRIL 5 MG PO TABS: ORAL_TABLET | ORAL | 8 refills | Status: DC

## 2019-05-15 MED ORDER — HYDROCHLOROTHIAZIDE 25 MG PO TABS: 25.0000 mg | ORAL_TABLET | Freq: Every day | ORAL | 8 refills | Status: DC

## 2019-05-17 ENCOUNTER — Other Ambulatory Visit (INDEPENDENT_AMBULATORY_CARE_PROVIDER_SITE_OTHER): Payer: BLUE CROSS/BLUE SHIELD

## 2019-05-17 ENCOUNTER — Other Ambulatory Visit: Payer: Self-pay

## 2019-05-17 DIAGNOSIS — E119 Type 2 diabetes mellitus without complications: Secondary | ICD-10-CM

## 2019-05-18 LAB — HGB A1C W/O EAG: Hgb A1c MFr Bld: 9.6 % — ABNORMAL HIGH (ref 4.8–5.6)

## 2019-05-21 ENCOUNTER — Other Ambulatory Visit: Payer: Self-pay | Admitting: Internal Medicine

## 2019-05-21 DIAGNOSIS — E119 Type 2 diabetes mellitus without complications: Secondary | ICD-10-CM

## 2019-06-15 ENCOUNTER — Other Ambulatory Visit: Payer: Self-pay

## 2019-06-15 ENCOUNTER — Encounter: Payer: Self-pay | Admitting: Internal Medicine

## 2019-06-15 ENCOUNTER — Telehealth (INDEPENDENT_AMBULATORY_CARE_PROVIDER_SITE_OTHER): Payer: BLUE CROSS/BLUE SHIELD | Admitting: Internal Medicine

## 2019-06-15 DIAGNOSIS — R519 Headache, unspecified: Secondary | ICD-10-CM

## 2019-06-15 DIAGNOSIS — E119 Type 2 diabetes mellitus without complications: Secondary | ICD-10-CM | POA: Diagnosis not present

## 2019-06-15 DIAGNOSIS — L989 Disorder of the skin and subcutaneous tissue, unspecified: Secondary | ICD-10-CM

## 2019-06-15 DIAGNOSIS — I10 Essential (primary) hypertension: Secondary | ICD-10-CM | POA: Diagnosis not present

## 2019-06-15 MED ORDER — GLIPIZIDE 5 MG PO TABS: ORAL_TABLET | ORAL | 11 refills | Status: DC

## 2019-06-15 NOTE — Progress Notes (Signed)
Subjective:    Patient ID: Laura Douglas, female   DOB: [birth year], 58 y.o.   MRN: [id number]   HPI   Virtual via [employer], but soon converted to telephone only as could not receive her audio. Interpretation via [employer]  1.  DM:  Discussed A1C earlier in month continues going higher with level now at 9.6%.  Was 7.2% 1 year ago.   States she is physically active walking with her work.  Outside of work, she is walking and dancing--does this 2-3 times weekly for an hour. Feels she is eating in a healthy way, but describes relatively high carb diet--likes plantains a lot and eats daily. Breakfast this morning:  Ham and cheese sandwich with wheat bread.  Drank coffee with stevia. Lunch yesterday:  Chicken tamale, rice, beans.  Drank water. Dinner yesterday:  Mace[employer] Pro eggs, plantains, beans.  When checks sugars, gets levels of high 100s to above 200s Later admits she forgot her medication this morning, but states only today.   2.  Hypertension:  Taking meds, but states sometimes high.  Today BP was 140/90 around 10:30 at work today.  Stressed with her son.   3.  Headache:  Has had for 6 days.  Pain involves the left side of face--mainly over zygomatic arch area.  No rash over same area. Having problems with her son and feels this may be causing what she is causing the headache/facial pain.  4.  Skin lesion, right face:  Did not make her Dermatology appt.  She did call and cancel, but states her insurance did not cover her copay to go to Dr. Ronnald Ramp, and she did not have the money, so she did not go.  Discussed the patient covers the copay, not insurance.   She will have a new insurance in January.  She will check to find out what her copay will be with a specialist and make sure she can afford, then let us know to proceed with the referral.  Current Meds  Medication Sig  . acetaminophen (TYLENOL) 325 MG tablet Take 650 mg by mouth as needed. Reported on 06/03/2015  . aspirin EC 81  MG tablet Take 81 mg by mouth daily.  Marland Kitchen atorvastatin (LIPITOR) 40 MG tablet TAKE 1 TABLET BY MOUTH ONCE DAILY WITH EVENING MEAL  . Blood Glucose Monitoring Suppl (AGAMATRIX PRESTO) w/Device KIT Check sugars before meals twice sugars  . glipiZIDE (GLUCOTROL) 5 MG tablet 1/2 tab by mouth twice daily with meals  . glucose blood (AGAMATRIX PRESTO TEST) test strip Check sugars twice daily before meals  . hydrochlorothiazide (HYDRODIURIL) 25 MG tablet Take 1 tablet (25 mg total) by mouth daily.  Marland Kitchen lisinopril (ZESTRIL) 5 MG tablet 1 tab by mouth daily with HCTZ  . metFORMIN (GLUCOPHAGE) 1000 MG tablet TAKE 1 TABLET BY MOUTH TWICE DAILY WITH A MEAL **MUST  HAVE  OFFICE  VISIT  FOR  REFILLS**   No Known Allergies   Review of Systems    Objective:   LMP  (LMP Unknown)   Physical Exam  Appears fine on initial video   Assessment & Plan   1.  DM:  Increase Glipizide to entire 5 mg tab twice daily.  Work on lifestyle changes.  Discussed less starch and more fiber with fruits/veggies/ and grains.  2.  Hypertension:  Unable to clarify whether her bp is okay at home today.  Will have her come in for a nurse visit for BP and pulse check. : 3.  Headache:  Not clear what this is from.  She will need to be seen physically to adequately evaluate.    4.  Concerning skin lesion, right face.  She will check on covered dermatologists, her specialist copay and let us know when she is ready for re referral.

## 2019-07-31 ENCOUNTER — Other Ambulatory Visit: Payer: Self-pay

## 2019-07-31 MED ORDER — ATORVASTATIN CALCIUM 40 MG PO TABS: ORAL_TABLET | ORAL | 5 refills | Status: DC

## 2019-08-02 ENCOUNTER — Encounter: Payer: Self-pay | Admitting: Gastroenterology

## 2019-08-28 ENCOUNTER — Ambulatory Visit (AMBULATORY_SURGERY_CENTER): Payer: 59 | Admitting: *Deleted

## 2019-08-28 ENCOUNTER — Other Ambulatory Visit: Payer: Self-pay

## 2019-08-28 VITALS — Temp 97.5°F | Ht 59.0 in | Wt 147.2 lb

## 2019-08-28 DIAGNOSIS — Z1509 Genetic susceptibility to other malignant neoplasm: Secondary | ICD-10-CM

## 2019-08-28 DIAGNOSIS — Z85038 Personal history of other malignant neoplasm of large intestine: Secondary | ICD-10-CM

## 2019-08-28 DIAGNOSIS — Z1159 Encounter for screening for other viral diseases: Secondary | ICD-10-CM

## 2019-08-28 DIAGNOSIS — Z86010 Personal history of colonic polyps: Secondary | ICD-10-CM

## 2019-08-28 MED ORDER — SUPREP BOWEL PREP KIT 17.5-3.13-1.6 GM/177ML PO SOLN: 1.0000 | Freq: Once | ORAL | 0 refills | Status: AC

## 2019-08-28 NOTE — Progress Notes (Signed)
Patient is Spanish speaking, Used Interpreter Scarlette Calico during PV appt.  Patient denies any allergies to egg or soy products. Patient denies complications with anesthesia/sedation.  Patient denies oxygen use at home and denies diet medications. Emmi instructions for colonoscopy/endoscopy explained and given to patient.

## 2019-09-05 ENCOUNTER — Encounter: Payer: Self-pay | Admitting: Gastroenterology

## 2019-09-08 ENCOUNTER — Other Ambulatory Visit: Payer: Self-pay

## 2019-09-08 ENCOUNTER — Other Ambulatory Visit: Payer: Self-pay | Admitting: Gastroenterology

## 2019-09-08 ENCOUNTER — Ambulatory Visit (INDEPENDENT_AMBULATORY_CARE_PROVIDER_SITE_OTHER): Payer: 59

## 2019-09-08 DIAGNOSIS — Z1159 Encounter for screening for other viral diseases: Secondary | ICD-10-CM

## 2019-09-09 LAB — SARS CORONAVIRUS 2 (TAT 6-24 HRS): SARS Coronavirus 2: NEGATIVE

## 2019-09-13 ENCOUNTER — Ambulatory Visit (AMBULATORY_SURGERY_CENTER): Payer: 59 | Admitting: Gastroenterology

## 2019-09-13 ENCOUNTER — Other Ambulatory Visit: Payer: Self-pay

## 2019-09-13 ENCOUNTER — Encounter: Payer: Self-pay | Admitting: Gastroenterology

## 2019-09-13 VITALS — BP 134/76 | HR 101 | Resp 14

## 2019-09-13 DIAGNOSIS — D122 Benign neoplasm of ascending colon: Secondary | ICD-10-CM

## 2019-09-13 DIAGNOSIS — K2950 Unspecified chronic gastritis without bleeding: Secondary | ICD-10-CM

## 2019-09-13 DIAGNOSIS — B9681 Helicobacter pylori [H. pylori] as the cause of diseases classified elsewhere: Secondary | ICD-10-CM

## 2019-09-13 DIAGNOSIS — Z1509 Genetic susceptibility to other malignant neoplasm: Secondary | ICD-10-CM | POA: Diagnosis not present

## 2019-09-13 DIAGNOSIS — Z85038 Personal history of other malignant neoplasm of large intestine: Secondary | ICD-10-CM

## 2019-09-13 DIAGNOSIS — Z86010 Personal history of colonic polyps: Secondary | ICD-10-CM

## 2019-09-13 MED ORDER — SODIUM CHLORIDE 0.9 % IV SOLN: 500.0000 mL | Freq: Once | INTRAVENOUS | Status: DC

## 2019-09-13 NOTE — Progress Notes (Signed)
Temp-MH VS-DT  Pt's states no medical or surgical changes since previsit or office visit.  Interpreter used today at the [facility] for this pt.  Interpreter's name is-Maria Macario Carls

## 2019-09-13 NOTE — Progress Notes (Signed)
Called to room to assist during endoscopic procedure.  Patient ID and intended procedure confirmed with present staff. Received instructions for my participation in the procedure from the performing physician.  

## 2019-09-13 NOTE — Op Note (Signed)
[place] Patient Name: Laura Douglas Procedure Date: 09/13/2019 1:40 PM MRN: [id number] Endoscopist: Justice Britain , MD Age: 59 Referring MD:  Date of Birth: Feb 09, 1961 Gender: Female Account #: [id number] Procedure:                Upper GI endoscopy Indications:              Hereditary nonpolyposis colorectal cancer (Lynch                            Syndrome) Medicines:                Monitored Anesthesia Care Procedure:                Pre-Anesthesia Assessment:                           - Prior to the procedure, a History and Physical                            was performed, and patient medications and                            allergies were reviewed. The patient's tolerance of                            previous anesthesia was also reviewed. The risks                            and benefits of the procedure and the sedation                            options and risks were discussed with the patient.                            All questions were answered, and informed consent                            was obtained. Prior Anticoagulants: The patient has                            taken no previous anticoagulant or antiplatelet                            agents. ASA Grade Assessment: II - A patient with                            mild systemic disease. After reviewing the risks                            and benefits, the patient was deemed in                            satisfactory condition to undergo the procedure.  After obtaining informed consent, the endoscope was                            passed under direct vision. Throughout the                            procedure, the patient's blood pressure, pulse, and                            oxygen saturations were monitored continuously. The                            Endoscope was introduced through the mouth, and                            advanced to the second part of  duodenum. The upper                            GI endoscopy was accomplished without difficulty.                            The patient tolerated the procedure. Scope In: Scope Out: Findings:                 No gross lesions were noted in the entire esophagus.                           The Z-line was regular and was found 37 cm from the                            incisors.                           Patchy mildly erythematous mucosa without bleeding                            was found in the gastric body.                           No gross lesions were noted in the entire examined                            stomach. Biopsies were taken with a cold forceps                            for histology and Helicobacter pylori testing.                           No gross lesions were noted in the duodenal bulb,                            in the first portion of the duodenum and in the  second portion of the duodenum. Complications:            No immediate complications. Estimated Blood Loss:     Estimated blood loss was minimal. Impression:               - No gross lesions in esophagus.                           - Z-line regular, 37 cm from the incisors.                           - Erythematous mucosa in the gastric body. Biopsied.                           - No gross lesions in the duodenal bulb, in the                            first portion of the duodenum and in the second                            portion of the duodenum. Recommendation:           - Proceed to scheduled colonoscopy.                           - Observe patient's clinical course.                           - Await pathology results.                           - Based on findings of pathology would consider                            role of PPI therapy.                           - Repeat EGD in 2-years for high-risk screening                            Lynch Syndrome.                           - The  findings and recommendations were discussed                            with the patient. Justice Britain, MD 09/13/2019 2:34:46 PM

## 2019-09-13 NOTE — Op Note (Signed)
[place] Patient Name: Laura Douglas Procedure Date: 09/13/2019 1:40 PM MRN: [id number] Endoscopist: Justice Britain , MD Age: 59 Referring MD:  Date of Birth: 06-17-1960 Gender: Female Account #: [id number] Procedure:                Colonoscopy Indications:              High risk colon cancer surveillance: Personal                            history of colon cancer, High risk colon cancer                            surveillance: Personal history of hereditary                            nonpolyposis colorectal cancer (Lynch Syndrome) Medicines:                Monitored Anesthesia Care Procedure:                Pre-Anesthesia Assessment:                           - Prior to the procedure, a History and Physical                            was performed, and patient medications and                            allergies were reviewed. The patient's tolerance of                            previous anesthesia was also reviewed. The risks                            and benefits of the procedure and the sedation                            options and risks were discussed with the patient.                            All questions were answered, and informed consent                            was obtained. Prior Anticoagulants: The patient has                            taken no previous anticoagulant or antiplatelet                            agents. ASA Grade Assessment: II - A patient with                            mild systemic disease. After reviewing the risks  and benefits, the patient was deemed in                            satisfactory condition to undergo the procedure.                           After obtaining informed consent, the colonoscope                            was passed under direct vision. Throughout the                            procedure, the patient's blood pressure, pulse, and                            oxygen  saturations were monitored continuously. The                            Colonoscope was introduced through the anus and                            advanced to the the ileocolonic anastomosis. The                            colonoscopy was performed without difficulty. The                            patient tolerated the procedure. The quality of the                            bowel preparation was good. Scope In: 2:12:04 PM Scope Out: 2:26:38 PM Scope Withdrawal Time: 0 hours 11 minutes 10 seconds  Total Procedure Duration: 0 hours 14 minutes 34 seconds  Findings:                 The digital rectal exam findings include                            hemorrhoids. Pertinent negatives include no                            palpable rectal lesions.                           There was evidence of a prior functional end-to-end                            ileo-colonic anastomosis in the colon. This was                            patent. The anastomosis has what looks to be a                            previous fistulization or secondary opening  present. I was able to pass a forceps through one                            opening and see it come through the other. The                            anastomosis was not traversed into the neo-TI.                           A 4 mm polyp was found in the presumed transverse                            colon about 6 cm from the anastomosis. The polyp                            was sessile. The polyp was removed with a cold                            snare. Resection and retrieval were complete.                           Normal mucosa was found in the entire colon                            otherwise.                           Non-bleeding non-thrombosed external and internal                            hemorrhoids were found during retroflexion, during                            perianal exam and during digital exam. The                             hemorrhoids were Grade I (internal hemorrhoids that                            do not prolapse). Complications:            No immediate complications. Estimated Blood Loss:     Estimated blood loss was minimal. Impression:               - Hemorrhoids found on digital rectal exam.                           - Patent functional end-to-end ileo-colonic                            anastomosis. Not clear that the extra                            opening/fistula is causing any issues for patient  but query the role of cutting this area and whether                            it could be of assistance to patient in regards to                            constipation symptoms she describes.                           - One 4 mm polyp in the transverse colon, removed                            with a cold snare. Resected and retrieved.                           - Normal mucosa in the entire examined colon                            otherwise.                           - Non-bleeding non-thrombosed external and internal                            hemorrhoids. Recommendation:           - The patient will be observed post-procedure,                            until all discharge criteria are met.                           - Discharge patient to home.                           - Patient has a contact number available for                            emergencies. The signs and symptoms of potential                            delayed complications were discussed with the                            patient. Return to normal activities tomorrow.                            Written discharge instructions were provided to the                            patient.                           - High fiber diet.                           -  Await pathology results.                           - Consider role of CT-Enterography with oral                            contrast in future vs  SBFT vs a Barium Enema to                            better define the anatomy of the ileocolonic                            anastomosis.                           - Recommend initiating Miralax 1-2 times daily. If                            no BM or hard bowel movement persists then consider                            Dulcolax.                           - Would start Oral Fiber supplementation with                            Fibercon or Metamucil 1-2 times daily.                           - Follow up in clinic in 6-weeks and discuss how                            symptoms are going and in case additional workup as                            outlined above is necessary and/or other medication                            trial for Constipation is needed                            (Linzess/Trulance/Amitiza).                           - Repeat Colonoscopy in 1-year for High-Risk                            Screening in setting of Lynch Syndrome and prior                            Colon Cancer.                           - The findings and recommendations were discussed  with the patient. Justice Britain, MD 09/13/2019 2:43:07 PM

## 2019-09-13 NOTE — Patient Instructions (Signed)
Discharge instructions given. Handouts on polyps,hemorrhoids and Gastritis. Resume previous medications. Office will call and schedule follow up appointment for 6 weeks. YOU HAD AN ENDOSCOPIC PROCEDURE TODAY AT [facility]:   Refer to the procedure report that was given to you for any specific questions about what was found during the examination.  If the procedure report does not answer your questions, please call your gastroenterologist to clarify.  If you requested that your care partner not be given the details of your procedure findings, then the procedure report has been included in a sealed envelope for you to review at your convenience later.  YOU SHOULD EXPECT: Some feelings of bloating in the abdomen. Passage of more gas than usual.  Walking can help get rid of the air that was put into your GI tract during the procedure and reduce the bloating. If you had a lower endoscopy (such as a colonoscopy or flexible sigmoidoscopy) you may notice spotting of blood in your stool or on the toilet paper. If you underwent a bowel prep for your procedure, you may not have a normal bowel movement for a few days.  Please Note:  You might notice some irritation and congestion in your nose or some drainage.  This is from the oxygen used during your procedure.  There is no need for concern and it should clear up in a day or so.  SYMPTOMS TO REPORT IMMEDIATELY:   Following lower endoscopy (colonoscopy or flexible sigmoidoscopy):  Excessive amounts of blood in the stool  Significant tenderness or worsening of abdominal pains  Swelling of the abdomen that is new, acute  Fever of 100F or higher   Following upper endoscopy (EGD)  Vomiting of blood or coffee ground material  New chest pain or pain under the shoulder blades  Painful or persistently difficult swallowing  New shortness of breath  Fever of 100F or higher  Black, tarry-looking stools  For urgent or emergent issues, a  gastroenterologist can be reached at any hour by calling [phone]. Do not use MyChart messaging for urgent concerns.    DIET:  We do recommend a small meal at first, but then you may proceed to your regular diet.  Drink plenty of fluids but you should avoid alcoholic beverages for 24 hours.  ACTIVITY:  You should plan to take it easy for the rest of today and you should NOT DRIVE or use heavy machinery until tomorrow (because of the sedation medicines used during the test).    FOLLOW UP: Our staff will call the number listed on your records 48-72 hours following your procedure to check on you and address any questions or concerns that you may have regarding the information given to you following your procedure. If we do not reach you, we will leave a message.  We will attempt to reach you two times.  During this call, we will ask if you have developed any symptoms of COVID 19. If you develop any symptoms (ie: fever, flu-like symptoms, shortness of breath, cough etc.) before then, please call [phone].  If you test positive for Covid 19 in the 2 weeks post procedure, please call and report this information to [place].    If any biopsies were taken you will be contacted by phone or by letter within the next 1-3 weeks.  Please call us at [phone] if you have not heard about the biopsies in 3 weeks.    SIGNATURES/CONFIDENTIALITY: You and/or your care partner have signed paperwork  which will be entered into your electronic medical record.  These signatures attest to the fact that that the information above on your After Visit Summary has been reviewed and is understood.  Full responsibility of the confidentiality of this discharge information lies with you and/or your care-partner.

## 2019-09-13 NOTE — Progress Notes (Signed)
Report given to PACU, vss 

## 2019-09-13 NOTE — Progress Notes (Signed)
Retta Mac interpreter.

## 2019-09-18 ENCOUNTER — Telehealth: Payer: Self-pay

## 2019-09-18 NOTE — Telephone Encounter (Signed)
Left message on follow up call. 

## 2019-09-21 ENCOUNTER — Other Ambulatory Visit: Payer: Self-pay

## 2019-09-21 ENCOUNTER — Encounter: Payer: Self-pay | Admitting: Gastroenterology

## 2019-09-21 MED ORDER — OMEPRAZOLE 40 MG PO CPDR: 40.0000 mg | DELAYED_RELEASE_CAPSULE | Freq: Two times a day (BID) | ORAL | 3 refills | Status: DC

## 2019-09-21 MED ORDER — PYLERA 140-125-125 MG PO CAPS: 3.0000 | ORAL_CAPSULE | Freq: Three times a day (TID) | ORAL | 0 refills | Status: DC

## 2019-10-17 ENCOUNTER — Telehealth: Payer: Self-pay | Admitting: Gastroenterology

## 2019-10-17 NOTE — Telephone Encounter (Signed)
I was not aware that PA was needed for [place]. I will start PA in covermymeds.

## 2019-10-19 MED ORDER — BISMUTH SUBSALICYLATE 262 MG PO CHEW: 524.0000 mg | CHEWABLE_TABLET | Freq: Four times a day (QID) | ORAL | 0 refills | Status: DC

## 2019-10-19 MED ORDER — METRONIDAZOLE 250 MG PO TABS: 250.0000 mg | ORAL_TABLET | Freq: Four times a day (QID) | ORAL | 0 refills | Status: DC

## 2019-10-19 MED ORDER — DOXYCYCLINE HYCLATE 100 MG PO TABS: 100.0000 mg | ORAL_TABLET | Freq: Two times a day (BID) | ORAL | 0 refills | Status: DC

## 2019-10-19 MED ORDER — OMEPRAZOLE 40 MG PO CPDR: 40.0000 mg | DELAYED_RELEASE_CAPSULE | Freq: Two times a day (BID) | ORAL | 0 refills | Status: DC

## 2019-10-19 NOTE — Telephone Encounter (Addendum)
Recd denial from Insurance. I will send in alternatives to see if it will be covered by patients insurance. I have called and informed [facility]. They are going to fill rx's and contact pt. Laura Douglas has also called pt and informed her of the change.

## 2019-10-26 ENCOUNTER — Ambulatory Visit: Payer: 59 | Admitting: Gastroenterology

## 2019-11-02 ENCOUNTER — Ambulatory Visit (INDEPENDENT_AMBULATORY_CARE_PROVIDER_SITE_OTHER): Payer: 59 | Admitting: Gastroenterology

## 2019-11-02 ENCOUNTER — Other Ambulatory Visit: Payer: 59

## 2019-11-02 ENCOUNTER — Encounter: Payer: Self-pay | Admitting: Gastroenterology

## 2019-11-02 VITALS — BP 122/70 | HR 94 | Ht 60.5 in | Wt 145.0 lb

## 2019-11-02 DIAGNOSIS — K5909 Other constipation: Secondary | ICD-10-CM

## 2019-11-02 DIAGNOSIS — Z1509 Genetic susceptibility to other malignant neoplasm: Secondary | ICD-10-CM

## 2019-11-02 DIAGNOSIS — K2950 Unspecified chronic gastritis without bleeding: Secondary | ICD-10-CM | POA: Diagnosis not present

## 2019-11-02 DIAGNOSIS — A048 Other specified bacterial intestinal infections: Secondary | ICD-10-CM | POA: Diagnosis not present

## 2019-11-02 DIAGNOSIS — R933 Abnormal findings on diagnostic imaging of other parts of digestive tract: Secondary | ICD-10-CM

## 2019-11-02 MED ORDER — DOXYCYCLINE HYCLATE 100 MG PO TABS
100.0000 mg | ORAL_TABLET | Freq: Two times a day (BID) | ORAL | 0 refills | Status: AC
Start: 2019-11-02 — End: 2019-11-16

## 2019-11-02 MED ORDER — OMEPRAZOLE 40 MG PO CPDR
40.0000 mg | DELAYED_RELEASE_CAPSULE | Freq: Every day | ORAL | 0 refills | Status: DC
Start: 2019-11-02 — End: 2021-01-22

## 2019-11-02 MED ORDER — BISMUTH SUBSALICYLATE 262 MG PO CHEW
524.0000 mg | CHEWABLE_TABLET | Freq: Four times a day (QID) | ORAL | 0 refills | Status: AC
Start: 2019-11-02 — End: 2019-11-16

## 2019-11-02 MED ORDER — METRONIDAZOLE 250 MG PO TABS
250.0000 mg | ORAL_TABLET | Freq: Four times a day (QID) | ORAL | 0 refills | Status: AC
Start: 2019-11-02 — End: 2019-11-16

## 2019-11-02 NOTE — Patient Instructions (Addendum)
Recoja las recetas en la farmacia.-  Tratamiento H. Pylori: tome los medicamentos indicados.  El desglose es el siguiente (todos los x 14 das, [place] descontinuar):  Desayuno:  * Tableta masticable de subsalicilato de bismuto (Pepto Bismol) 262 mg--   2 tabletas  * Doxiciclina 100 mg-1 tableta  * Metronidazol (Flagyl) 250 mg tableta-1 tableta  * Omeprazol (Prilosec) 40 mg-1 tableta  Almuerzo:  * Tableta masticable de subsalicilato de bismuto (Pepto Bismol) 262 mg--   2 tabletas   Cena: * Tableta masticable de subsalicilato de bismuto (Pepto Bismol) 262 mg--   2 tabletas  * Doxiciclina 100 mg - 1 tableta  * Metronidazol (Flagyl) 250 mg tableta-1 tableta  * Omeprazol (Prilosec) 40 mg-1 tableta  Hora de acostarse: * Tableta masticable de subsalicilato de bismuto (Pepto Bismol) 262 mg--  2 tabletas    Una vez que se complete el tratamiento para H. pylori, suspenda el uso de omeprazol durante 14 das y enve un estudio de [place]. Por favor traiga Truddie Coco de materia fecal al laboratorio ubicado en el stano de nuestro edificio.   Puede reiniciar omeprazol 40 mg una vez al da despus de que se hayan completado los estudios de [place].   Debera haber terminado con el tratamiento el 11/16/19.  Deja de tomar omeprazol.  Enve el estudio de heces el 12/01/19.  Necesitar un seguimiento en el consultorio en 6 meses.  Thank you for choosing me and [facility].  Dr. Rush Landmark

## 2019-11-05 ENCOUNTER — Encounter: Payer: Self-pay | Admitting: Gastroenterology

## 2019-11-05 DIAGNOSIS — A048 Other specified bacterial intestinal infections: Secondary | ICD-10-CM | POA: Insufficient documentation

## 2019-11-05 DIAGNOSIS — R933 Abnormal findings on diagnostic imaging of other parts of digestive tract: Secondary | ICD-10-CM | POA: Insufficient documentation

## 2019-11-05 DIAGNOSIS — K5909 Other constipation: Secondary | ICD-10-CM | POA: Insufficient documentation

## 2019-11-05 DIAGNOSIS — K2970 Gastritis, unspecified, without bleeding: Secondary | ICD-10-CM | POA: Insufficient documentation

## 2019-11-05 NOTE — Progress Notes (Signed)
[place] VISIT   Primary Care Provider Patient, No Pcp Per No address on file None   Patient Profile: Laura Douglas is a 59 y.o. female with a pmh significant for Lynch syndrome Variant, colon cancer status post resection, adenomatous colon polyps, chronic gastritis secondary to H. pylori infection (still need to ensure eradication), diabetes, hypertension, obesity, uterine cancer.  The patient presents to the [facility] for an evaluation and management of problem(s) noted below:  Problem List 1. Lynch syndrome   2. Helicobacter pylori infection   3. Other chronic gastritis without hemorrhage   4. Abnormal colonoscopy   5. Other constipation     History of Present Illness Please see initial consultation note and progress notes for full details of HPI.  Interval History Today's visit is performed with the aid of interpreter services.  I saw the patient for her screening colonoscopy and upper endoscopy in March of this year.  During her endoscopy we found on biopsy specimens that she had active H. pylori infection.  We tried to treat her with Pylera but she could not afford this.  We sent her individualized antibiotics to treat her H. pylori.  However, the patient states this morning that she is not taking any other medications other than the omeprazole.  It is helped with her infrequent indigestion.  However, she did not take any antibiotics.  Thus it is not clear that she has been significantly treated for H. pylori infection.  At the time of her colonoscopy we found an opening near her anastomosis that almost looked to be fistula like.  However, was more likely a result of previous surgical intervention with an abnormality.  I queried whether it could be a potential role for her having issues with constipation.  However, the patient states she is having a bowel movement daily.  She is not straining.  No blood in her stools.  She has  not had to use MiraLAX daily as we have previously discussed.  She is happy with her bowel habits.  She has no abdominal pain.  She once again is describes no family history of small bowel cancers.  GI Review of Systems Positive as above Negative for dysphagia, odynophagia, pyrosis, decreased appetite, early satiety, change in bowel habits, melena, hematochezia   Review of Systems General: Denies fevers/chills/weight loss Cardiovascular: Denies chest pain Pulmonary: Denies shortness of breath Gastroenterological: See HPI Genitourinary: Denies darkened urine Hematological: Denies easy bruising/bleeding Dermatological: Denies jaundice Psychological: Mood is stable   Medications Current Outpatient Medications  Medication Sig Dispense Refill  . atorvastatin (LIPITOR) 40 MG tablet TAKE 1 TABLET BY MOUTH ONCE DAILY WITH EVENING MEAL 30 tablet 5  . Blood Glucose Monitoring Suppl (AGAMATRIX PRESTO) w/Device KIT Check sugars before meals twice sugars 1 kit 0  . glipiZIDE (GLUCOTROL) 5 MG tablet 1 tab by mouth twice daily with meals 60 tablet 11  . glucose blood (AGAMATRIX PRESTO TEST) test strip Check sugars twice daily before meals 100 each 12  . hydrochlorothiazide (HYDRODIURIL) 25 MG tablet Take 1 tablet (25 mg total) by mouth daily. 30 tablet 8  . lisinopril (ZESTRIL) 5 MG tablet 1 tab by mouth daily with HCTZ 30 tablet 8  . metFORMIN (GLUCOPHAGE) 1000 MG tablet TAKE 1 TABLET BY MOUTH TWICE DAILY WITH A MEAL **MUST  HAVE  OFFICE  VISIT  FOR  REFILLS** 60 tablet 0  . omeprazole (PRILOSEC) 40 MG capsule Take 1 capsule (40 mg total) by mouth in the  morning and at bedtime. After 10 days reduce to once daily (Patient taking differently: Take 40 mg by mouth daily. ) 90 capsule 3  . bismuth subsalicylate (PEPTO-BISMOL) 262 MG chewable tablet Chew 2 tablets (524 mg total) by mouth 4 (four) times daily for 14 days. 112 tablet 0  . doxycycline (VIBRA-TABS) 100 MG tablet Take 1 tablet (100 mg total) by  mouth 2 (two) times daily for 14 days. 28 tablet 0  . metroNIDAZOLE (FLAGYL) 250 MG tablet Take 1 tablet (250 mg total) by mouth 4 (four) times daily for 14 days. 56 tablet 0  . omeprazole (PRILOSEC) 40 MG capsule Take 1 capsule (40 mg total) by mouth daily. After completion of H. Pylori pt will decrease to once daily dosing. 28 capsule 0   No current facility-administered medications for this visit.    Allergies No Known Allergies  Histories Past Medical History:  Diagnosis Date  . Colon cancer [facility])    possible? Patient had surgery in [place]. Denies needing adjuvant therapy. The surgery "removed a tumor from the colon".  . Constipation    occasional  . Diabetes mellitus without complication (HCC)    type 2  . Family history of uterine cancer   . Hyperlipidemia   . Hypertension   . Lynch syndrome   . Obesity   . Uterine cancer ([place]) 2016   MSH2/MSH6 LOH on IHC   Past Surgical History:  Procedure Laterality Date  . abortions     twice   . arm surgery     left arm  . COLON SURGERY    . COLON SURGERY  2010   tumor removal  . COLONOSCOPY  02/17/2018   Mansouraty ta x 2, hems  . FRACTURE SURGERY  2006   Lt lower arm  . MULTIPLE TOOTH EXTRACTIONS    . ROBOTIC ASSISTED TOTAL HYSTERECTOMY WITH BILATERAL SALPINGO OOPHERECTOMY Bilateral 09/11/2014   Procedure: ROBOTIC ASSISTED TOTAL LAPAROSCOPIC HYSTERECTOMY WITH BILATERAL SALPINGO OOPHORECTOMY LYMPH NODE DISSECTION; LYSIS OF ADHESIONS;  Surgeon: Janie Morning, MD;  Location: [facility];  Service: Gynecology;  Laterality: Bilateral;  . TUBAL LIGATION  1993  . UPPER GASTROINTESTINAL ENDOSCOPY     Social History   Socioeconomic History  . Marital status: Single    Spouse name: Not on file  . Number of children: 4  . Years of education: Not on file  . Highest education level: Not on file  Occupational History  . Occupation: [employer] work.  Tobacco Use  . Smoking status: Never Smoker  . Smokeless tobacco: Never Used    Substance and Sexual Activity  . Alcohol use: No  . Drug use: No  . Sexual activity: Yes    Birth control/protection: Surgical    Comment: Hysterectomy  Other Topics Concern  . Not on file  Social History Narrative   Lives at home with her female partner   Only son killed by gang violence in [place].   Social Determinants of Health   Financial Resource Strain:   . Difficulty of Paying Living Expenses:   Food Insecurity:   . Worried About Charity fundraiser in the Last Year:   . Arboriculturist in the Last Year:   Transportation Needs:   . Film/video editor (Medical):   Marland Kitchen Lack of Transportation (Non-Medical):   Physical Activity:   . Days of Exercise per Week:   . Minutes of Exercise per Session:   Stress:   . Feeling of Stress :   Social  Connections:   . Frequency of Communication with Friends and Family:   . Frequency of Social Gatherings with Friends and Family:   . Attends Religious Services:   . Active Member of Clubs or Organizations:   . Attends Archivist Meetings:   Marland Kitchen Marital Status:   Intimate Partner Violence:   . Fear of Current or Ex-Partner:   . Emotionally Abused:   Marland Kitchen Physically Abused:   . Sexually Abused:    Family History  Problem Relation Age of Onset  . Prostate cancer Father   . Diabetes Mother   . Uterine cancer Sister 46  . Cancer Sister        colon cancer as well age 86?  Marland Kitchen Colon cancer Sister   . Uterine cancer Maternal Aunt   . Alcohol abuse Brother   . Cirrhosis Brother        cause of death  . Breast cancer Neg Hx   . Rectal cancer Neg Hx   . Stomach cancer Neg Hx    I have reviewed her medical, social, and family history in detail and updated the electronic medical record as necessary.    PHYSICAL EXAMINATION  BP 122/70   Pulse 94   Ht 5' 0.5" (1.537 m)   Wt 145 lb (65.8 kg)   LMP  (LMP Unknown)   BMI 27.85 kg/m  GEN: NAD, appears stated age, doesn't appear chronically ill PSYCH: Cooperative, without  pressured speech EYE: Conjunctivae pink, sclerae anicteric ENT: MMM CV: RR without R/Gs   RESP: No wheezing present GI: NABS, soft, NT/ND, surgical scars present, hernia present MSK/EXT: No lower extremity edema SKIN: No jaundice NEURO:  Alert & Oriented x 3, no focal deficits   REVIEW OF DATA  I reviewed the following data at the time of this encounter:  GI Procedures and Studies  March 2021 colonoscopy - Hemorrhoids found on digital rectal exam. - Patent functional end-to-end ileo-colonic anastomosis. Not clear that the extra opening/fistula is causing any issues for patient but query the role of cutting this area and whether it could be of assistance to patient in regards to constipation symptoms she describes. - One 4 mm polyp in the transverse colon, removed with a cold snare. Resected and retrieved. - Normal mucosa in the entire examined colon otherwise. - Non-bleeding non-thrombosed external and internal hemorrhoids.  March 2021 EGD - No gross lesions in esophagus. - Z-line regular, 37 cm from the incisors. - Erythematous mucosa in the gastric body. Biopsied. - No gross lesions in the duodenal bulb, in the first portion of the duodenum and in the second portion of the duodenum.  Pathology Diagnosis 1. Surgical [P], gastric bx - MILDLY ACTIVE CHRONIC HELICOBACTER PYLORI GASTRITIS. - WARTHIN-STARRY POSITIVE FOR HELICOBACTER PYLORI. - NO INTESTINAL METAPLASIA, DYSPLASIA OR CARCINOMA. 2. Surgical [P], right colon polyp - COLONIC MUCOSA WITH BENIGN LYMPHOID AGGREGATES. - NO ADENOMATOUS CHANGE OR CARCINOMA.   Laboratory Studies  Reviewed in epic  Imaging Studies  No relevant imaging studies to review   ASSESSMENT  Ms. Hemminger is a 59 y.o. 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  The patient is seen today for evaluation and management of:  1. Lynch syndrome   2. Helicobacter pylori infection   3. Other chronic gastritis without hemorrhage   4. Abnormal colonoscopy   5. Other  constipation    The patient is hemodynamically and clinically stable.  She has Lynch syndrome variant and will need to continue high risk colon cancer screening/surveillance.  She is also has a history of adenomatous polyps on prior colonoscopy.  Most recent colonoscopy showed no adenomas but did show some irregularity/abnormality at the region of her ileocolonic anastomosis.  I queried whether this area may need some sort of other endoscopic intervention to help with constipation at the time but she is having good bowel movements.  She has no pain or bloating symptoms.  I will not pursue any sort of endoscopic resection or fistula opening at this time.  She will need repeat colon cancer screening in 1 year.  At time of her endoscopy, she had not had 1 previously, we found that she had chronic gastritis with H. pylori.  We tried to treat her with antibiotics but not clear that she ended up completing a true treatment.  We went through all of her medications very closely today with her and we will plan 2 weeks of treatment and subsequent 1 months of PPI therapy thereafter.  She will then stop her PPI for 2 weeks and then come in for H. pylori stool antigen testing.  She may then restart her PPI.  We have gone over this with her and with interpreter services and she has this documented in the system.  Hopefully, we will have the H. pylori treated ensure eradication.  We will see.  There is no family history of small bowel cancers and she has no evidence of iron deficiency anemia.  We will hold on video capsule endoscopy at this time.  She has a dermatologist and follows with that.  She has previously had a hysterectomy but will follow up with her primary care doctor in  regards to mammography and further cervical/Pap smears that may be necessary.  All patient questions were answered, to the best of my ability, and the patient agrees to the aforementioned plan of action with follow-up as indicated.   PLAN  Pylera individual ingredients x2 weeks for treatment of H. pylori Continue PPI once daily x4 weeks after treatment Stop PPI for 2 weeks H. pylori stool antigen at that time May restart PPI thereafter Repeat colonoscopy in March 2022 Likely repeat EGD in March 2022 versus 2023 as long as H. pylori is eradicated we will decide No evidence of anemia at this time so we will hold on video capsule endoscopy Follow-up with dermatology Follow-up with PCP for urogenital cancer screening as well as mammography needs   Orders Placed This Encounter  Procedures  . Helicobacter pylori special antigen    New Prescriptions   BISMUTH SUBSALICYLATE (PEPTO-BISMOL) 262 MG CHEWABLE TABLET    Chew 2 tablets (524 mg total) by mouth 4 (four) times daily for 14 days.   DOXYCYCLINE (VIBRA-TABS) 100 MG TABLET    Take 1 tablet (100 mg total) by mouth 2 (two) times daily for 14 days.   METRONIDAZOLE (FLAGYL) 250 MG TABLET    Take 1 tablet (250 mg total) by mouth 4 (four) times daily for 14 days.   OMEPRAZOLE (PRILOSEC) 40 MG CAPSULE    Take 1 capsule (40 mg total) by mouth daily. After completion of H. Pylori pt will decrease to once daily dosing.   Modified Medications   No medications on file    Planned Follow Up No follow-ups on file.   Total Time in Face-to-Face and in Coordination of Care for patient including  independent/personal interpretation/review of prior testing, medical history, examination, medication adjustment, communicating results with the patient directly, and documentation with the EHR is 30 minutes.   Justice Britain, MD [facility] Advanced Endoscopy Office # [phone]

## 2019-12-01 ENCOUNTER — Other Ambulatory Visit: Payer: 59

## 2019-12-01 DIAGNOSIS — Z1509 Genetic susceptibility to other malignant neoplasm: Secondary | ICD-10-CM

## 2019-12-01 DIAGNOSIS — A048 Other specified bacterial intestinal infections: Secondary | ICD-10-CM

## 2019-12-04 LAB — HELICOBACTER PYLORI  SPECIAL ANTIGEN
MICRO NUMBER:: 10608244
SPECIMEN QUALITY: ADEQUATE

## 2020-01-24 ENCOUNTER — Other Ambulatory Visit: Payer: Self-pay

## 2020-01-24 ENCOUNTER — Ambulatory Visit: Payer: 59 | Attending: Family Medicine | Admitting: Family Medicine

## 2020-01-24 VITALS — BP 132/82 | HR 107 | Ht 60.0 in | Wt 156.0 lb

## 2020-01-24 DIAGNOSIS — C541 Malignant neoplasm of endometrium: Secondary | ICD-10-CM | POA: Diagnosis not present

## 2020-01-24 DIAGNOSIS — I10 Essential (primary) hypertension: Secondary | ICD-10-CM | POA: Diagnosis not present

## 2020-01-24 DIAGNOSIS — Z85038 Personal history of other malignant neoplasm of large intestine: Secondary | ICD-10-CM

## 2020-01-24 DIAGNOSIS — E1169 Type 2 diabetes mellitus with other specified complication: Secondary | ICD-10-CM

## 2020-01-24 DIAGNOSIS — E785 Hyperlipidemia, unspecified: Secondary | ICD-10-CM

## 2020-01-24 LAB — POCT GLYCOSYLATED HEMOGLOBIN (HGB A1C): HbA1c, POC (controlled diabetic range): 7.8 % — AB (ref 0.0–7.0)

## 2020-01-24 LAB — GLUCOSE, POCT (MANUAL RESULT ENTRY): POC Glucose: 173 mg/dl — AB (ref 70–99)

## 2020-01-24 MED ORDER — HYDROCHLOROTHIAZIDE 25 MG PO TABS: 25.0000 mg | ORAL_TABLET | Freq: Every day | ORAL | 6 refills | Status: DC

## 2020-01-24 MED ORDER — GLIPIZIDE 5 MG PO TABS: ORAL_TABLET | ORAL | 6 refills | Status: DC

## 2020-01-24 MED ORDER — CANAGLIFLOZIN 100 MG PO TABS: 100.0000 mg | ORAL_TABLET | Freq: Every day | ORAL | 6 refills | Status: DC

## 2020-01-24 MED ORDER — LISINOPRIL 5 MG PO TABS: ORAL_TABLET | ORAL | 6 refills | Status: DC

## 2020-01-24 MED ORDER — METFORMIN HCL 1000 MG PO TABS: ORAL_TABLET | ORAL | 6 refills | Status: DC

## 2020-01-24 MED ORDER — ATORVASTATIN CALCIUM 40 MG PO TABS: ORAL_TABLET | ORAL | 5 refills | Status: DC

## 2020-01-24 NOTE — Progress Notes (Signed)
Subjective:  Patient ID: Laura Douglas, female    DOB: Nov 30, 1960  Age: 59 y.o. MRN: [id number]  CC: New Patient (Initial Visit) and Diabetes   HPI Laura Douglas is a 59 year old female with a history of Lynch syndrome, colon cancer in 2009 (status post resection), endometrial cancer (status post TAHBSO LND in 08/2014 with pathology revealing stage Ia grade 1 endometrioid endometrial cancer with no lymphovascular space invasion and negative lymph nodes) type 2 diabetes mellitus (A1c 7.8) who presents today to establish care.  Previously followed by [facility]. Last seen by GYN oncology in 02/2017; was supposed to follow-up in 6 months but never did. Last seen by GI in 10/2016 with last colonoscopy in 08/2019.  Her blood sugars are 130-140 fasting and random in the 200.  She denies hypoglycemic episodes and endorses compliance with her medications.  Also doing well on her statin and antihypertensive. She has no acute concerns today.  Past Medical History:  Diagnosis Date  . Colon cancer [facility])    possible? Patient had surgery in [place]. Denies needing adjuvant therapy. The surgery "removed a tumor from the colon".  . Constipation    occasional  . Diabetes mellitus without complication (HCC)    type 2  . Family history of uterine cancer   . Hyperlipidemia   . Hypertension   . Lynch syndrome   . Obesity   . 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 abortions     twice   . arm surgery     left arm  . COLON SURGERY    . COLON SURGERY  2010   tumor removal  . COLONOSCOPY  02/17/2018   Mansouraty ta x 2, hems  . FRACTURE SURGERY  2006   Lt lower arm  . MULTIPLE TOOTH EXTRACTIONS    . ROBOTIC ASSISTED TOTAL HYSTERECTOMY WITH BILATERAL SALPINGO OOPHERECTOMY Bilateral 09/11/2014   Procedure: ROBOTIC ASSISTED TOTAL LAPAROSCOPIC HYSTERECTOMY WITH BILATERAL SALPINGO OOPHORECTOMY LYMPH  NODE DISSECTION; LYSIS OF ADHESIONS;  Surgeon: Janie Morning, MD;  Location: [facility];  Service: Gynecology;  Laterality: Bilateral;  . TUBAL LIGATION  1993  . UPPER GASTROINTESTINAL ENDOSCOPY      Family History  Problem Relation Age of Onset  . Prostate cancer Father   . Diabetes Mother   . Uterine cancer Sister 97  . Cancer Sister        colon cancer as well age 81?  Marland Kitchen Colon cancer Sister   . Uterine cancer Maternal Aunt   . Alcohol abuse Brother   . Cirrhosis Brother        cause of death  . Breast cancer Neg Hx   . Rectal cancer Neg Hx   . Stomach cancer Neg Hx     No Known Allergies  Outpatient Medications Prior to Visit  Medication Sig Dispense Refill  . Blood Glucose Monitoring Suppl (AGAMATRIX PRESTO) w/Device KIT Check sugars before meals twice sugars 1 kit 0  . glucose blood (AGAMATRIX PRESTO TEST) test strip Check sugars twice daily before meals 100 each 12  . omeprazole (PRILOSEC) 40 MG capsule Take 1 capsule (40 mg total) by mouth in the morning and at bedtime. After 10 days reduce to once daily (Patient taking differently: Take 40 mg by mouth daily. ) 90 capsule 3  . atorvastatin (LIPITOR) 40 MG tablet TAKE 1 TABLET BY MOUTH ONCE DAILY WITH EVENING MEAL 30 tablet  5  . glipiZIDE (GLUCOTROL) 5 MG tablet 1 tab by mouth twice daily with meals 60 tablet 11  . hydrochlorothiazide (HYDRODIURIL) 25 MG tablet Take 1 tablet (25 mg total) by mouth daily. 30 tablet 8  . lisinopril (ZESTRIL) 5 MG tablet 1 tab by mouth daily with HCTZ 30 tablet 8  . metFORMIN (GLUCOPHAGE) 1000 MG tablet TAKE 1 TABLET BY MOUTH TWICE DAILY WITH A MEAL **MUST  HAVE  OFFICE  VISIT  FOR  REFILLS** 60 tablet 0  . omeprazole (PRILOSEC) 40 MG capsule Take 1 capsule (40 mg total) by mouth daily. After completion of H. Pylori pt will decrease to once daily dosing. (Patient not taking: Reported on 01/24/2020) 28 capsule 0   No facility-administered medications prior to visit.     ROS Review of Systems    Constitutional: Negative for activity change, appetite change and fatigue.  HENT: Negative for congestion, sinus pressure and sore throat.   Eyes: Negative for visual disturbance.  Respiratory: Negative for cough, chest tightness, shortness of breath and wheezing.   Cardiovascular: Negative for chest pain and palpitations.  Gastrointestinal: Negative for abdominal distention, abdominal pain and constipation.  Endocrine: Negative for polydipsia.  Genitourinary: Negative for dysuria and frequency.  Musculoskeletal: Negative for arthralgias and back pain.  Skin: Negative for rash.  Neurological: Negative for tremors, light-headedness and numbness.  Hematological: Does not bruise/bleed easily.  Psychiatric/Behavioral: Negative for agitation and behavioral problems.    Objective:  BP 132/82   Pulse (!) 107   Ht 5' (1.524 m)   Wt 156 lb (70.8 kg)   LMP  (LMP Unknown)   SpO2 96%   BMI 30.47 kg/m   BP/Weight 01/24/2020 11/02/2019 12/10/3149  Systolic BP 761 607 371  Diastolic BP 82 70 76  Wt. (Lbs) 156 145 -  BMI 30.47 27.85 -      Physical Exam Constitutional:      Appearance: She is well-developed.  Neck:     Vascular: No JVD.  Cardiovascular:     Rate and Rhythm: Tachycardia present.     Heart sounds: Normal heart sounds. No murmur heard.   Pulmonary:     Effort: Pulmonary effort is normal.     Breath sounds: Normal breath sounds. No wheezing or rales.  Chest:     Chest wall: No tenderness.  Abdominal:     General: Bowel sounds are normal. There is no distension.     Palpations: Abdomen is soft. There is no mass.     Tenderness: There is no abdominal tenderness.  Musculoskeletal:        General: Normal range of motion.     Right lower leg: No edema.     Left lower leg: No edema.  Neurological:     Mental Status: She is alert and oriented to person, place, and time.  Psychiatric:        Mood and Affect: Mood normal.     CMP Latest Ref Rng & Units 08/15/2018  01/03/2018 02/12/2017  Glucose 65 - 99 mg/dL - 122(H) -  BUN 6 - 24 mg/dL - 11 -  Creatinine 0.57 - 1.00 mg/dL - 0.71 -  Sodium 134 - 144 mmol/L - 142 -  Potassium 3.5 - 5.2 mmol/L - 4.4 -  Chloride 96 - 106 mmol/L - 102 -  CO2 20 - 29 mmol/L - 24 -  Calcium 8.7 - 10.2 mg/dL - 9.6 -  Total Protein 6.0 - 8.5 g/dL 6.7 7.1 7.2  Total Bilirubin 0.0 -  1.2 mg/dL 0.4 0.4 0.3  Alkaline Phos 39 - 117 IU/L 56 60 65  AST 0 - 40 IU/L _0 ALT 0 - 32 IU/L 36(H) 30 28    Lipid Panel     Component Value Date/Time   CHOL 124 12/26/2018 0857   TRIG 135 12/26/2018 0857   HDL 38 (L) 12/26/2018 0857   CHOLHDL 6.7 (H) 06/04/2015 0925   VLDL 67 (H) 06/04/2015 0925   LDLCALC 59 12/26/2018 0857    CBC    Component Value Date/Time   WBC 6.4 11/17/2016 0852   WBC 7.5 03/12/2016 1122   RBC 4.96 11/17/2016 0852   RBC 4.77 03/12/2016 1122   HGB 13.7 11/17/2016 0852   HCT 42.8 11/17/2016 0852   PLT 237 11/17/2016 0852   MCV 86 11/17/2016 0852   MCH 27.6 11/17/2016 0852   MCH 28.7 03/12/2016 1122   MCHC 32.0 11/17/2016 0852   MCHC 33.1 03/12/2016 1122   RDW 13.1 11/17/2016 0852   LYMPHSABS 2.7 11/17/2016 0852   MONOABS 0.4 03/12/2016 1122   EOSABS 0.2 11/17/2016 0852   BASOSABS 0.0 11/17/2016 0852    Lab Results  Component Value Date   HGBA1C 7.8 (A) 01/24/2020    Assessment & Plan:  1. Type 2 diabetes mellitus with other specified complication, without long-term current use of insulin (HCC) A1c of 7.8 which is slightly above goal of less than 7.0 Invokana added to regimen for optimization of glycemic control Counseled on Diabetic diet, my plate method, 671 minutes of moderate intensity exercise/week Blood sugar logs with fasting goals of 80-120 mg/dl, random of less than 180 and in the event of sugars less than 60 mg/dl or greater than 400 mg/dl encouraged to notify the clinic. Advised on the need for annual eye exams, annual foot exams, Pneumonia vaccine. - POCT glucose (manual  entry) - POCT glycosylated hemoglobin (Hb A1C) - glipiZIDE (GLUCOTROL) 5 MG tablet; 1 tab by mouth twice daily with meals  Dispense: 60 tablet; Refill: 6 - CMP14+EGFR; Future - Lipid panel; Future - Microalbumin / creatinine urine ratio; Future - canagliflozin (INVOKANA) 100 MG TABS tablet; Take 1 tablet (100 mg total) by mouth daily before breakfast.  Dispense: 30 tablet; Refill: 6  2. Hyperlipidemia associated with type 2 diabetes mellitus (HCC) Controlled Low-cholesterol diet - atorvastatin (LIPITOR) 40 MG tablet; TAKE 1 TABLET BY MOUTH ONCE DAILY WITH EVENING MEAL  Dispense: 30 tablet; Refill: 5 - metFORMIN (GLUCOPHAGE) 1000 MG tablet; TAKE 1 TABLET BY MOUTH TWICE DAILY WITH A MEAL  Dispense: 60 tablet; Refill: 6  3. History of colon cancer Status post resection Continue surveillance colonoscopies Followed by GI  4. MSH2-related endometrial cancer [facility]) Status post TAH/BSO Missed her appointment for surveillance We will refer back to GYN as she probably needs surveillance for up to 5 years post surgery - Ambulatory referral to Gynecology  5. Essential (primary) hypertension Controlled Counseled on blood pressure goal of less than 130/80, low-sodium, DASH diet, medication compliance, 150 minutes of moderate intensity exercise per week. Discussed medication compliance, adverse effects. - hydrochlorothiazide (HYDRODIURIL) 25 MG tablet; Take 1 tablet (25 mg total) by mouth daily.  Dispense: 30 tablet; Refill: 6 - lisinopril (ZESTRIL) 5 MG tablet; 1 tab by mouth daily with HCTZ  Dispense: 30 tablet; Refill: 6    Meds ordered this encounter  Medications  . atorvastatin (LIPITOR) 40 MG tablet    Sig: TAKE 1 TABLET BY MOUTH ONCE DAILY WITH EVENING MEAL  Dispense:  30 tablet    Refill:  5  . glipiZIDE (GLUCOTROL) 5 MG tablet    Sig: 1 tab by mouth twice daily with meals    Dispense:  60 tablet    Refill:  6  . hydrochlorothiazide (HYDRODIURIL) 25 MG tablet    Sig: Take 1  tablet (25 mg total) by mouth daily.    Dispense:  30 tablet    Refill:  6  . lisinopril (ZESTRIL) 5 MG tablet    Sig: 1 tab by mouth daily with HCTZ    Dispense:  30 tablet    Refill:  6  . metFORMIN (GLUCOPHAGE) 1000 MG tablet    Sig: TAKE 1 TABLET BY MOUTH TWICE DAILY WITH A MEAL    Dispense:  60 tablet    Refill:  6  . canagliflozin (INVOKANA) 100 MG TABS tablet    Sig: Take 1 tablet (100 mg total) by mouth daily before breakfast.    Dispense:  30 tablet    Refill:  6    Follow-up: Return in about 3 months (around 04/25/2020) for Chronic disease management.       Charlott Rakes, MD, FAAFP. [facility] and [place], [place]   01/24/2020, 5:11 PM

## 2020-02-07 ENCOUNTER — Other Ambulatory Visit: Payer: Self-pay

## 2020-02-07 ENCOUNTER — Ambulatory Visit: Payer: 59 | Attending: Family Medicine

## 2020-02-07 ENCOUNTER — Inpatient Hospital Stay: Payer: 59 | Attending: Genetic Counselor | Admitting: Genetic Counselor

## 2020-02-07 DIAGNOSIS — Z1509 Genetic susceptibility to other malignant neoplasm: Secondary | ICD-10-CM

## 2020-02-07 DIAGNOSIS — Z1379 Encounter for other screening for genetic and chromosomal anomalies: Secondary | ICD-10-CM

## 2020-02-07 DIAGNOSIS — E1169 Type 2 diabetes mellitus with other specified complication: Secondary | ICD-10-CM

## 2020-02-08 LAB — MICROALBUMIN / CREATININE URINE RATIO
Creatinine, Urine: 213.7 mg/dL
Microalb/Creat Ratio: 6 mg/g creat (ref 0–29)
Microalbumin, Urine: 12.9 ug/mL

## 2020-02-08 LAB — CMP14+EGFR
ALT: 27 IU/L (ref 0–32)
AST: 20 IU/L (ref 0–40)
Albumin/Globulin Ratio: 2.2 (ref 1.2–2.2)
Albumin: 4.7 g/dL (ref 3.8–4.9)
Alkaline Phosphatase: 48 IU/L (ref 48–121)
BUN/Creatinine Ratio: 25 — ABNORMAL HIGH (ref 9–23)
BUN: 18 mg/dL (ref 6–24)
Bilirubin Total: 0.4 mg/dL (ref 0.0–1.2)
CO2: 24 mmol/L (ref 20–29)
Calcium: 9.8 mg/dL (ref 8.7–10.2)
Chloride: 103 mmol/L (ref 96–106)
Creatinine, Ser: 0.71 mg/dL (ref 0.57–1.00)
GFR calc Af Amer: 109 mL/min/{1.73_m2} (ref >59)
GFR calc non Af Amer: 95 mL/min/{1.73_m2} (ref >59)
Globulin, Total: 2.1 g/dL (ref 1.5–4.5)
Glucose: 92 mg/dL (ref 65–99)
Potassium: 3.8 mmol/L (ref 3.5–5.2)
Sodium: 144 mmol/L (ref 134–144)
Total Protein: 6.8 g/dL (ref 6.0–8.5)

## 2020-02-08 LAB — LIPID PANEL
Chol/HDL Ratio: 2.9 ratio (ref 0.0–4.4)
Cholesterol, Total: 142 mg/dL (ref 100–199)
HDL: 49 mg/dL (ref >39)
LDL Chol Calc (NIH): 75 mg/dL (ref 0–99)
Triglycerides: 99 mg/dL (ref 0–149)
VLDL Cholesterol Cal: 18 mg/dL (ref 5–40)

## 2020-02-08 NOTE — Progress Notes (Signed)
REFERRING PROVIDER: No referring provider defined for this encounter.  PRIMARY PROVIDER:  Patient, No Pcp Per  PRIMARY REASON FOR VISIT:  1. Genetic testing   2. Lynch syndrome     GENETIC TEST RESULTS   Patient Name: Laura Douglas Patient Age: 59 y.o. Encounter Date: 02/07/2020  HPI: Laura Douglas was seen in the [place] clinic due to a personal and family history of cancer and concerns regarding a hereditary predisposition to cancer. Previously, genetic testing identified a variant of uncertain significance.  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  Please refer to our prior cancer genetics clinic note for more information regarding Laura Douglas medical, social and family histories, and our assessment and recommendations, at the time. Laura Douglas recent genetic test results were disclosed to her, as were recommendations warranted by these results. These results and recommendations are discussed in more detail below.   FAMILY HISTORY:  We obtained a detailed, 4-generation family history.  Significant diagnoses are listed below: Family History  Problem Relation Age of Onset  . Prostate cancer Father   . Diabetes Mother   . Uterine cancer Sister 36  . Cancer Sister        colon cancer as well age 55?  Marland Kitchen Colon cancer Sister   . Uterine cancer Maternal Aunt   . Alcohol abuse Brother   . Cirrhosis Brother        cause of death  . Breast cancer Neg Hx   . Rectal cancer Neg Hx   . Stomach cancer Neg Hx     The patient had one brother and has two sisters.  One sister was diagnosed with both colon and uterine cancer. Laura Douglas has one daughter and two sons alive, and one son who is deceased.  Her mother is alive at 24, and her father passed away with prostate cancer at 55.  He had 8 brothers and 1 sister.  His sister had uterine cancer and died before Laura Douglas was born.  Laura Douglas does  not have information on her mother's family.  Patient's maternal ancestors are of [place] descent, and paternal ancestors are of [place] descent. There is no reported Ashkenazi Jewish ancestry. There is no known consanguinity.  GENETIC TESTING:  At the time of Laura Douglas visit, we recommended she pursue genetic testing of the [facility] gene panel. 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  A copy of the test report has been scanned into Epic for review and a partial view is scanned below.     SCREENING RECOMMENDATIONS: We discussed the implications of Lynch syndrome for Laura Douglas, and discussed who else in the family should have genetic testing. We recommended Laura Douglas follow management guidelines for Lynch syndrome; all of which are outlined below. These can be coordinated by Laura Douglas GI doctor or her primary provider.   1. Annual colonoscopy in individuals who are not affected with colon cancer.   2. While there is no clear evidence to support screening for stomach and small bowel cancer, an upper endoscopy can be considered at 3-5 year intervals beginning at age 83-35. However, whether to have this screening is best determined by the gastroenterologist.   3. Annual urinalysis.   4. For women with Lynch syndrome, unlike the effective surveillance plan for colorectal cancer risk, there is no professional  agreement regarding management for the increased risk of uterine and ovarian cancer. For women with Lynch syndrome, unlike the effective surveillance plan for colorectal cancer risk, there is no professional agreement regarding management for the increased risk of uterine and ovarian cancer. For endometrial cancer, women are encouraged to be aware of and immediately report dysfunctional or  post-menopausal bleeding, which should then be followed up by an endometrial biopsy. In terms of surveillance, transvaginal ultrasound and endometrial biopsies have not been shown to be effective screening tools; however, they may be considered at the clinician's discretion. Importantly, transvaginal ultrasound is not recommended in pre-menopausal women due to variable presentations throughout a normal menstrual cycle. Endometrial biopsy can be considered every 1-2 years, but does not have proven benefit of reducing mortality in women with Lynch syndrome given the typically early presenting symptoms. Finally, while hysterectomy has not been shown to reduce endometrial cancer mortality, it does reduce incidence, and therefore, can be considered as a risk-reducing option.  5. Unfortunately, symptoms of early stage ovarian cancer are not as obvious as endometrial cancer; however, women are encouraged to be aware of symptoms, such as pelvic or abdominal pain, bloating, increased abdominal girth, difficulty eating, feeling full from eating quickly, as well as increased urinary frequency and urgency. In terms of surveillance, transvaginal ultrasound examination and serum CA-125 have not been shown to be sufficiently sensitive or specific to support for routine screening. In terms of risk-reducing surgery, historically, women have been recommended to undergo a bilateral salpingo-oophorectomy (BSO) regardless of gene mutation; however, with the collection of gene-specific data, this recommendation has evolved. It is also important for women to understand the following:   1. Women should seek medical attention if they experience abnormal vaginal bleeding.  2. Some providers may still recommend vaginal ultrasounds, uterine biopsies (for uterine cancer risk) and/or CA-125 analysis ( for ovarian cancer risk), even though these have not been shown to be effective.  3. A hysterectomy with removal of the ovaries and  fallopian tubes should be considered once childbearing is completed (if planned).  FAMILY MEMBERS: Since we now know the mutation in [name], we can test at-risk relatives to determine whether or not they have inherited the mutation and are at increased risk for cancer.  It is important that all of Ms. Rachels relatives (both men and women) know of the presence of this gene mutation. Site-specific genetic testing can sort out who in the family is at risk and who is not. We will be happy to meet with any of the family members or refer them to a genetic counselor in their local area. To locate genetic counselors in other cities, individuals can visit the website of the [employer] (ArtistMovie.se) and Secretary/administrator for a Social worker by zip code.   Ms. Neal children and siblings have a 50% chance to have inherited this mutation. We recommend they have genetic testing for this same mutation, as identifying the presence of this mutation would allow them to also take advantage of risk-reducing measures. We will look into the ability of different laboratories to ship a kit to [place] in order to have Ms. Kalisz family members undergo genetic testing.  Children who inherit two mutations in the same Lynch gene, one mutation from each parent, are at-risk of a rare recessive condition called constitutional mismatch repair deficiency (CMMR-D) syndrome. If family members have this mutation, they may wish to have their partner tested if they are planning on having children.  PLAN: Ms.  Galeano-Ramirez will need to be followed as high risk based on her diagnosis of Lynch syndrome.    Ms. Ehle has identified a gastroenterologist that she plans to see for follow up for her diagnosis of Lynch syndrome.  This individual is Dr. Rush Landmark.  We will forward this letter to him so that he can follow Ms. Blyth appropriately.    Ms.  Bowersox has had a complete hysterectomy, removing her ovaries, fallopian tubes and uterus on September 11, 2014.  As a result, she does not have a gynecologist she sees regularly.  We will refer her to the high risk clinic to see Dr. Jana Hakim, who is a Spanish speaking oncologist.  He can help coordinate her medical management.    RESOURCES:  Ms. Holsclaw was given patient information about Lynch syndrome that was written in Spanish by the [employer], as well as in [place] by that [employer], [employer], and [employer].   We strongly encouraged Ms. Puccini to remain in contact with [place] in cancer genetics on an annual basis so we can update Ms. Fritchman personal and family histories, and inform her of advances in cancer genetics that may be of benefit for the entire family. Ms. Philemon knows she is also welcome to call with any questions or concerns, at any time.   Ennifer Harston P. [place], [place], [facility]  Licensed, Insurance risk surveyor [email or web address] phone: [phone]  The patient was seen for a total of 65 minutes in face-to-face genetic counseling.

## 2020-02-09 ENCOUNTER — Telehealth: Payer: Self-pay | Admitting: Oncology

## 2020-02-09 NOTE — Telephone Encounter (Signed)
Received a referral for a new high risk appt w/Dr. Jana Hakim on 9/27 at 4pm. A letter has been mailed to the pt.

## 2020-02-12 ENCOUNTER — Telehealth: Payer: Self-pay

## 2020-02-12 NOTE — Telephone Encounter (Signed)
-----   Message from Charlott Rakes, MD sent at 02/09/2020  8:16 AM EDT ----- Please inform the patient that labs are normal. Thank you.

## 2020-02-12 NOTE — Telephone Encounter (Signed)
Patient was called and left a voicemail informing her of normal results.

## 2020-03-10 NOTE — Progress Notes (Signed)
[place]  [phone] [phone]     ID: Laura Douglas DOB: May 07, 1961  MR#: [id number]  ASN#:[id number]  Patient Care Team: Patient, No Pcp Per as PCP - General (General Practice) Laura Douglas OTHER MD:  CHIEF COMPLAINT:   CURRENT TREATMENT:    HISTORY OF CURRENT ILLNESS: Laura Douglas has a personal history of colon cancer in 2009 and uterine cancer in 2016. Her colon cancer was treated in [place] with surgery alone. For her uterine cancer, she underwent hysterectomy with bilateral salpingo-oophorectomy on 09/11/2014 under Dr. Skeet Douglas. 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  She was referred to genetic counseling on 10/17/2014. 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 Since then, the variant has been reclassified to suspected pathogenic and confirmed the diagnosis of Lynch syndrome.  The patient's subsequent history is as detailed below.   INTERVAL HISTORY: Laura Douglas was scheduled in the high risk breast cancer clinic on 03/11/2020, however she did not show for her appointment   REVIEW OF SYSTEMS:    PAST MEDICAL HISTORY: Past Medical History:  Diagnosis Date  . Colon cancer [facility])    possible? Patient had surgery in [place]. Denies needing adjuvant therapy. The surgery "removed a tumor from the colon".  . Constipation    occasional  . Diabetes mellitus without complication (HCC)    type 2  . Family history of uterine cancer   . Hyperlipidemia   . Hypertension   . Lynch syndrome   . Obesity   . 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 abortions     twice   . arm surgery     left arm  . COLON SURGERY    . COLON SURGERY  2010   tumor removal  . COLONOSCOPY   02/17/2018   Laura Douglas ta x 2, hems  . FRACTURE SURGERY  2006   Lt lower arm  . MULTIPLE TOOTH EXTRACTIONS    . ROBOTIC ASSISTED TOTAL HYSTERECTOMY WITH BILATERAL SALPINGO OOPHERECTOMY Bilateral 09/11/2014   Procedure: ROBOTIC ASSISTED TOTAL LAPAROSCOPIC HYSTERECTOMY WITH BILATERAL SALPINGO OOPHORECTOMY LYMPH NODE DISSECTION; LYSIS OF ADHESIONS;  Surgeon: Laura Morning, MD;  Location: [facility];  Service: Gynecology;  Laterality: Bilateral;  . TUBAL LIGATION  1993  . UPPER GASTROINTESTINAL ENDOSCOPY      FAMILY HISTORY: Family History  Problem Relation Age of Onset  . Prostate cancer Father   . Diabetes Mother   . Uterine cancer Sister 70  . Cancer Sister        colon cancer as well age 46?  Marland Kitchen Colon cancer Sister   . Uterine cancer Maternal Aunt   . Alcohol abuse Brother   . Cirrhosis Brother        cause of death  . Breast cancer Neg Hx   . Rectal cancer Neg Hx   . Stomach cancer Neg Hx    From genetic counseling note 10/17/2014: "The patient had one brother and has two sisters.  One sister was diagnosed recently with uterine cancer.  She is in [place].  [name] has one daughter and two sons alive, and one son who is deceased.  Her mother is alive at 80, and her father passed away with prostate cancer at 64.  He had 8 brothers and  1 sister.  His sister had uterine cancer and died before [name] was born.  [name] does not have information on her mother's family."   GYNECOLOGIC HISTORY:  No LMP recorded (lmp unknown). Patient has had a hysterectomy. Menarche: 59 years old Age at first live birth: 59 years old [place] P 4 LMP  Contraceptive: never used HRT: never used  Hysterectomy? Yes, for uterine cancer BSO? yes   SOCIAL HISTORY: (updated 02/2020)  Laura Douglas  ADVANCED DIRECTIVES:    HEALTH MAINTENANCE: Social History   Tobacco Use  . Smoking status: Never Smoker  . Smokeless tobacco: Never Used  Vaping Use  . Vaping Use: Never used  Substance Use Topics  .  Alcohol use: No  . Drug use: No     Colonoscopy: 08/2019 (Dr. Rush Landmark), repeat due 2023  PAP:   Bone density:    No Known Allergies  Current Outpatient Medications  Medication Sig Dispense Refill  . atorvastatin (LIPITOR) 40 MG tablet TAKE 1 TABLET BY MOUTH ONCE DAILY WITH EVENING MEAL 30 tablet 5  . Blood Glucose Monitoring Suppl (AGAMATRIX PRESTO) w/Device KIT Check sugars before meals twice sugars 1 kit 0  . canagliflozin (INVOKANA) 100 MG TABS tablet Take 1 tablet (100 mg total) by mouth daily before breakfast. 30 tablet 6  . glipiZIDE (GLUCOTROL) 5 MG tablet 1 tab by mouth twice daily with meals 60 tablet 6  . glucose blood (AGAMATRIX PRESTO TEST) test strip Check sugars twice daily before meals 100 each 12  . hydrochlorothiazide (HYDRODIURIL) 25 MG tablet Take 1 tablet (25 mg total) by mouth daily. 30 tablet 6  . lisinopril (ZESTRIL) 5 MG tablet 1 tab by mouth daily with HCTZ 30 tablet 6  . metFORMIN (GLUCOPHAGE) 1000 MG tablet TAKE 1 TABLET BY MOUTH TWICE DAILY WITH A MEAL 60 tablet 6  . omeprazole (PRILOSEC) 40 MG capsule Take 1 capsule (40 mg total) by mouth in the Douglas and at bedtime. After 10 days reduce to once daily (Patient taking differently: Take 40 mg by mouth daily. ) 90 capsule 3  . omeprazole (PRILOSEC) 40 MG capsule Take 1 capsule (40 mg total) by mouth daily. After completion of H. Pylori pt will decrease to once daily dosing. (Patient not taking: Reported on 01/24/2020) 28 capsule 0   No current facility-administered medications for this visit.    OBJECTIVE:   There were no vitals filed for this visit.   There is no height or weight on file to calculate BMI.   Wt Readings from Last 3 Encounters:  01/24/20 156 lb (70.8 kg)  11/02/19 145 lb (65.8 kg)  08/28/19 147 lb 3.2 oz (66.8 kg)      ECOG FS    LAB RESULTS:  CMP     Component Value Date/Time   NA 144 02/07/2020 0946   K 3.8 02/07/2020 0946   CL 103 02/07/2020 0946   CO2 24 02/07/2020 0946     GLUCOSE 92 02/07/2020 0946   GLUCOSE 247 (H) 03/12/2016 1122   BUN 18 02/07/2020 0946   CREATININE 0.71 02/07/2020 0946   CREATININE 0.66 06/04/2015 0925   CALCIUM 9.8 02/07/2020 0946   PROT 6.8 02/07/2020 0946   ALBUMIN 4.7 02/07/2020 0946   AST 20 02/07/2020 0946   ALT 27 02/07/2020 0946   ALKPHOS 48 02/07/2020 0946   BILITOT 0.4 02/07/2020 0946   GFRNONAA 95 02/07/2020 0946   GFRNONAA >89 06/04/2015 0925   GFRAA 109 02/07/2020 0946   GFRAA >89 06/04/2015  0925    No results found for: TOTALPROTELP, ALBUMINELP, A1GS, A2GS, BETS, BETA2SER, GAMS, MSPIKE, SPEI  Lab Results  Component Value Date   WBC 6.4 11/17/2016   NEUTROABS 3.2 11/17/2016   HGB 13.7 11/17/2016   HCT 42.8 11/17/2016   MCV 86 11/17/2016   PLT 237 11/17/2016    No results found for: LABCA2  No components found for: APOLID030  No results for input(s): INR in the last 168 hours.  No results found for: LABCA2  No results found for: DTH438  No results found for: OIL579  No results found for: JKQ206  No results found for: CA2729  No components found for: HGQUANT  No results found for: CEA1 / No results found for: CEA1   No results found for: AFPTUMOR  No results found for: CHROMOGRNA  No results found for: KPAFRELGTCHN, LAMBDASER, KAPLAMBRATIO (kappa/lambda light chains)  No results found for: HGBA, HGBA2QUANT, HGBFQUANT, HGBSQUAN (Hemoglobinopathy evaluation)   No results found for: LDH  No results found for: IRON, TIBC, IRONPCTSAT (Iron and TIBC)  No results found for: FERRITIN  Urinalysis    Component Value Date/Time   COLORURINE YELLOW 03/12/2016 1208   APPEARANCEUR Clear 08/18/2018 1609   LABSPEC 1.005 09/01/2016 1355   PHURINE 6.0 09/01/2016 1355   PHURINE 6.0 03/12/2016 1208   GLUCOSEU Negative 08/18/2018 1609   GLUCOSEU 100 09/01/2016 1355   HGBUR Negative 09/01/2016 [place] 03/12/2016 1208   BILIRUBINUR Negative 08/18/2018 1609   BILIRUBINUR Negative  09/01/2016 1355   KETONESUR Negative 09/01/2016 [place] 03/12/2016 1208   PROTEINUR Negative 08/18/2018 1609   PROTEINUR Negative 09/01/2016 [place] 03/12/2016 1208   UROBILINOGEN 0.2 09/01/2016 1355   NITRITE Negative 08/18/2018 1609   NITRITE Negative 09/01/2016 1355   NITRITE NEGATIVE 03/12/2016 1208   LEUKOCYTESUR Negative 08/18/2018 1609   LEUKOCYTESUR Negative 09/01/2016 1355     STUDIES: No results found.   ELIGIBLE FOR AVAILABLE RESEARCH PROTOCOL:   ASSESSMENT: 59 y.o. [place] woman with an M SH 2 pathogenic mutation and a history of Lynch syndrome   PLAN: The patient did not show for her high risk clinic appointment on 03/11/2020.  Follow-up letter has been sent.   Virgie Dad. Magrinat, MD 03/10/2020 11:06 AM Medical Oncology and Hematology [facility] [place], [place] [address] Tel. [phone]    Fax. [phone]   This document serves as a record of services personally performed by Lurline Del, MD. It was created on his behalf by Wilburn Mylar, a trained medical scribe. The creation of this record is based on the scribe's personal observations and the provider's statements to them.   I, Lurline Del MD, have reviewed the above documentation for accuracy and completeness, and I agree with the above.    *Total Encounter Time as defined by the Centers for Medicare and Medicaid Services includes, in addition to the face-to-face time of a patient visit (documented in the note above) non-face-to-face time: obtaining and reviewing outside history, ordering and reviewing medications, tests or procedures, care coordination (communications with other health care professionals or caregivers) and documentation in the medical record.

## 2020-03-11 ENCOUNTER — Encounter: Payer: Self-pay | Admitting: Oncology

## 2020-03-11 ENCOUNTER — Inpatient Hospital Stay: Payer: 59 | Attending: Genetic Counselor | Admitting: Oncology

## 2020-03-11 DIAGNOSIS — C540 Malignant neoplasm of isthmus uteri: Secondary | ICD-10-CM

## 2020-03-11 DIAGNOSIS — C541 Malignant neoplasm of endometrium: Secondary | ICD-10-CM

## 2020-03-11 DIAGNOSIS — C189 Malignant neoplasm of colon, unspecified: Secondary | ICD-10-CM

## 2020-03-16 ENCOUNTER — Other Ambulatory Visit: Payer: Self-pay | Admitting: Gastroenterology

## 2020-03-19 ENCOUNTER — Telehealth: Payer: Self-pay | Admitting: Oncology

## 2020-03-19 NOTE — Telephone Encounter (Signed)
Pt no showed for appt w/Dr. Jana Hakim on 9/27.

## 2020-03-29 ENCOUNTER — Telehealth: Payer: Self-pay | Admitting: Genetic Counselor

## 2020-03-29 NOTE — Telephone Encounter (Signed)
Using [employer], Lavon Paganini, I explained that we can go through [employer] to send a test kit to her family in [place] for genetic testing.  The cost of testing is $249.95 (however, in October 2021 there is a Audiological scientist for $150), and $72 per kit for shipping to/from [place].  An e-mail from the lab will be sent to their family members and instructions for how to do the test (saliva kit) will be sent - but all of this communication is in [place].    We can order the test from our office to get the test to them, and the report would come back to [place].  We could then report the results to Ms. Tomi Bamberger, but then she would need to report the information to her family members.  Alternatively, her family could order the testing directly, and then the report would go directly to them.  However, the report is in [place].  If there are family members in the [place], we can offer testing to them.  There are more options for cost and seeing patients when they are state-side rather than over seas.  The patient will let me know if there is anything more I can do.

## 2020-04-03 ENCOUNTER — Encounter: Payer: Self-pay | Admitting: Obstetrics & Gynecology

## 2020-04-03 ENCOUNTER — Ambulatory Visit (INDEPENDENT_AMBULATORY_CARE_PROVIDER_SITE_OTHER): Payer: 59 | Admitting: Obstetrics & Gynecology

## 2020-04-03 VITALS — BP 115/78 | HR 107 | Ht <= 58 in | Wt 149.3 lb

## 2020-04-03 DIAGNOSIS — C541 Malignant neoplasm of endometrium: Secondary | ICD-10-CM | POA: Diagnosis not present

## 2020-04-03 DIAGNOSIS — C540 Malignant neoplasm of isthmus uteri: Secondary | ICD-10-CM | POA: Diagnosis not present

## 2020-04-03 NOTE — Progress Notes (Signed)
Patient ID: YSABEL STANKOVICH, female   DOB: 06-22-1960, 59 y.o.   MRN: [id number]  Chief Complaint  Patient presents with  . Gynecologic Exam    HPI KRISHIKA BUGGE is a 59 y.o. female.  [id number] S/p RATH 08/2014 by Dr. Skeet Latch for early stage adenocarcinoma of the endometrium. She had normal f/u for 2 years and has not seen her oncologist since 2018.  HPI  Past Medical History:  Diagnosis Date  . Colon cancer [facility]    possible? Patient had surgery in [place]. Denies needing adjuvant therapy. The surgery "removed a tumor from the colon".  . Constipation    occasional  . Diabetes mellitus without complication (HCC)    type 2  . Family history of uterine cancer   . Hyperlipidemia   . Hypertension   . Lynch syndrome   . Obesity   . Uterine cancer ([place]) 2016   MSH2/MSH6 LOH on IHC    Past Surgical History:  Procedure Laterality Date  . ABDOMINAL HYSTERECTOMY    . abortions     twice   . arm surgery     left arm  . COLON SURGERY    . COLON SURGERY  2010   tumor removal  . COLONOSCOPY  02/17/2018   [place] ta x 2, hems  . FRACTURE SURGERY  2006   Lt lower arm  . MULTIPLE TOOTH EXTRACTIONS    . ROBOTIC ASSISTED TOTAL HYSTERECTOMY WITH BILATERAL SALPINGO OOPHERECTOMY Bilateral 09/11/2014   Procedure: ROBOTIC ASSISTED TOTAL LAPAROSCOPIC HYSTERECTOMY WITH BILATERAL SALPINGO OOPHORECTOMY LYMPH NODE DISSECTION; LYSIS OF ADHESIONS;  Surgeon: Janie Morning, MD;  Location: [facility];  Service: Gynecology;  Laterality: Bilateral;  . TUBAL LIGATION  1993  . UPPER GASTROINTESTINAL ENDOSCOPY      Family History  Problem Relation Age of Onset  . Prostate cancer Father   . Diabetes Mother   . Uterine cancer Sister 36  . Cancer Sister        colon cancer as well age 13?  Marland Kitchen Colon cancer Sister   . Uterine cancer Maternal Aunt   . Alcohol abuse Brother   . Cirrhosis Brother        cause of death  . Breast cancer Neg Hx   . Rectal cancer Neg Hx   . Stomach cancer  Neg Hx     Social History Social History   Tobacco Use  . Smoking status: Never Smoker  . Smokeless tobacco: Never Used  Vaping Use  . Vaping Use: Never used  Substance Use Topics  . Alcohol use: No  . Drug use: No    No Known Allergies  Current Outpatient Medications  Medication Sig Dispense Refill  . atorvastatin (LIPITOR) 40 MG tablet TAKE 1 TABLET BY MOUTH ONCE DAILY WITH EVENING MEAL 30 tablet 5  . Blood Glucose Monitoring Suppl (AGAMATRIX PRESTO) w/Device KIT Check sugars before meals twice sugars 1 kit 0  . canagliflozin (INVOKANA) 100 MG TABS tablet Take 1 tablet (100 mg total) by mouth daily before breakfast. 30 tablet 6  . glipiZIDE (GLUCOTROL) 5 MG tablet 1 tab by mouth twice daily with meals 60 tablet 6  . glucose blood (AGAMATRIX PRESTO TEST) test strip Check sugars twice daily before meals 100 each 12  . hydrochlorothiazide (HYDRODIURIL) 25 MG tablet Take 1 tablet (25 mg total) by mouth daily. 30 tablet 6  . lisinopril (ZESTRIL) 5 MG tablet 1 tab by mouth daily with HCTZ 30 tablet 6  . metFORMIN (GLUCOPHAGE) 1000 MG tablet  TAKE 1 TABLET BY MOUTH TWICE DAILY WITH A MEAL 60 tablet 6  . omeprazole (PRILOSEC) 40 MG capsule Take 1 capsule (40 mg total) by mouth in the morning and at bedtime. After 10 days reduce to once daily (Patient taking differently: Take 40 mg by mouth daily. ) 90 capsule 3  . omeprazole (PRILOSEC) 40 MG capsule Take 1 capsule (40 mg total) by mouth daily. After completion of H. Pylori pt will decrease to once daily dosing. (Patient not taking: Reported on 01/24/2020) 28 capsule 0  . omeprazole (PRILOSEC) 40 MG capsule Take 1 capsule by mouth twice daily 28 capsule 0   No current facility-administered medications for this visit.    Review of Systems Review of Systems  Constitutional: Negative.   Cardiovascular: Negative.   Gastrointestinal: Negative.   Genitourinary: Negative.     Blood pressure 115/78, pulse (!) 107, height _0  (1.473 m),  weight 149 lb 4.8 oz (67.7 kg).  Physical Exam Physical Exam Vitals and nursing note reviewed. Exam conducted with a chaperone present.  Constitutional:      Appearance: She is not ill-appearing.  Pulmonary:     Effort: Pulmonary effort is normal.  Abdominal:     General: Abdomen is flat.     Palpations: Abdomen is soft.  Genitourinary:    General: Normal vulva.     Vagina: No vaginal discharge (cuff well supported and no lesions. NO mass or tenderness).  Neurological:     Mental Status: She is alert.  Psychiatric:        Mood and Affect: Mood normal.        Behavior: Behavior normal.     Data Reviewed Surgical pathology Office notes Dr.Brewster  Assessment Endometrial cancer ([place]) - Plan: CANCELED: Cytology - PAP( [place])  Malignant neoplasm of isthmus of uterus ([place]) - Plan: CANCELED: Cytology - PAP( [place])    Plan She is now 5 years post treatment with no sign of recurrence. She may have routine gyn f/u, no need for yearly pelvic exam. Pap smear not needed in accordance with "choosing wisely" recommendations Yearly breast exam and mammography    Emeterio Reeve 04/03/2020, 4:42 PM

## 2020-04-03 NOTE — Patient Instructions (Signed)
Cncer de tero Uterine Cancer  El cncer de tero es un crecimiento anormal de tejido canceroso (tumor maligno) en el tero. A diferencia de los tumores no cancerosos (benignos), los tumores malignos pueden diseminarse a otras partes del cuerpo. El cncer de tero generalmente aparece despus de la menopausia. Sin embargo, tambin puede ocurrir en el momento en que la menopausia comienza. La pared del tero tiene una capa interna de tejido (endometrio) y una capa externa de tejido muscular (miometrio). El tipo ms frecuente de cncer de tero comienza en el endometrio (cncer de endometrio). El que se inicia en el miometrio (sarcoma uterino) es muy raro. Cules son las causas? Se desconoce la causa exacta de esta afeccin. Qu incrementa el riesgo? Es ms probable que desarrolle esta afeccin en los siguientes casos:  Tiene ms de 50 aos.  Tiene el endometrio agrandado (hiperplasia de endometrio).  [place] terapia hormonal.  Tiene un sobrepeso importante (obesidad).  [place] el tamoxifeno.  Es blanca (caucsica).  No puede tener hijos (es estril).  Nunca ha tenido Nutritional therapist.  Comenz a Government social research officer de los 12 aos.  Tiene ms de 8 aos y an tiene Chartered certified accountant.  Tiene antecedentes de cncer de ovarios, intestino, o colon o recto (cncer colorrectal).  Tiene antecedentes de ovarios agrandados con pequeos quistes (sndrome de ovario poliqustico).  Tiene antecedentes familiares de: ? Cncer de tero. ? Cncer de colon sin poliposis hereditario (HNPCC).  Tiene diabetes, hipertensin arterial, enfermedad tiroidea o enfermedad de la vescula biliar.  [place] anticonceptivos orales en altas dosis por [employer].  Ha estado expuesta a radiacin.  Fuma. Cules son los signos o los sntomas? Los sntomas de esta afeccin incluyen lo siguiente:  Soil scientist o sangrado vaginal anormal. El sangrado puede comenzar como un flujo sanguinolento y [place], en el que aparece  cada vez ms sangre. Este es el sntoma ms frecuente. Si presenta un sangrado vaginal anormal, no d por sentado que es parte de la menopausia.  Sangrado vaginal despus de la menopausia.  Prdida de peso sin causa aparente.  Hemorragias entre los perodos [employer].  Miccin dificultosa, dolorosa, o ms frecuente que lo habitual.  Un bulto (tumor) en la vagina.  Dolor, meteorismo o distensin en el abdomen.  Dolor en la zona plvica.  Penuelas. Cmo se diagnostica? Esta afeccin se puede diagnosticar en funcin de lo siguiente:  Sus sntomas y antecedentes mdicos.  Examen fsico y plvico. El mdico palpar su pelvis para detectar protuberancias o ganglios linfticos agrandados.  Anlisis de [place] y [place].  Estudios de diagnstico por imgenes, como radiografas, exploraciones por tomografas computarizadas, ecografas o resonancias magnticas.  Un procedimiento por el cual un tubo delgado y flexible con Ardelia Mems luz y una cmara en el extremo se inserta en la vagina y se [place] para ver el tero por dentro (histeroscopia).  Ardelia Mems prueba de Papanicolaou para detectar la presencia de clulas anormales en la parte baja del tero (cuello uterino) y la parte superior de la vagina.  La extraccin de Truddie Coco de tejido (biopsia) de la membrana que recubre el tero para ver si hay clulas cancerosas.  Dilatacin y curetaje (D y C). Este es un procedimiento que implica la expansin (dilatacin) del cuello uterino y el raspado (curetaje) del recubrimiento interno del tero para realizar una biopsia y Hydrographic surveyor clulas cancerosas. El tumor se estadificar para determinar su gravedad y extensin. La estadificacin es la evaluacin de lo siguiente:  El tamao del tumor.  Si el cncer  se ha diseminado.  Adnde se ha diseminado. Los estadios del cncer de tero son los siguientes:  EstadioI. El cncer se encuentra solamente en el tero.  Estadio II. El  cncer se ha diseminado al cuello uterino.  Estadio III. El cncer se ha diseminado al exterior del tero, pero no fuera de la pelvis. El cncer puede haberse diseminado a los ganglios linfticos en la pelvis. Los ganglios linfticos forman parte del sistema que combate las enfermedades del cuerpo (inmunitario). Estos ganglios se encuentran en muchas partes del cuerpo, incluido el cuello, las axilas y la ingle.  Estadio IV. El cncer se ha extendido a Airline pilot del cuerpo, como la vejiga o el recto. Cmo se trata? Esta afeccin suele tratarse con ciruga para extirpar lo siguiente:  El tero, el cuello uterino, las trompas de Falopio y los ovarios (histerectoma total).  El tero y el cuello uterino ( histerectoma simple). El tipo de histerectoma que le realizarn depender de la extensin de su cncer. En algunos casos se extirparn los ganglios linfticos cercanos al tero. El tratamiento tambin puede incluir una o ms de las siguientes terapias:  Quimioterapia. Este tratamiento [place] medicamentos para destruir las clulas cancerosas y Product/process development scientist su diseminacin.  Radioterapia. Este tratamiento utiliza rayos de alta potencia para destruir las clulas cancerosas y Product/process development scientist la diseminacin del cncer.  Quimiorradioterapia. Es un tratamiento combinado que alterna quimioterapia con tratamientos de radiacin para intensificar la eficacia de la radiacin.  Braquiterapia. Esto implica la colocacin de materiales radioactivos dentro del organismo donde se elimin Science writer.  Terapia hormonal. Incluye tomar medicamentos que [employer] niveles de estrgeno en el organismo. Siga estas instrucciones en su casa: Actividad  Retome sus actividades normales como se lo haya indicado el mdico. Pregntele al mdico qu actividades son seguras para usted.  Haga actividad fsica habitualmente como se lo haya indicado el mdico.  No conduzca ni use maquinaria pesada mientras toma analgsicos  recetados. Instrucciones generales  [employer] de venta libre y los recetados solamente como se lo haya indicado el mdico.  Mantenga una dieta saludable.  Colabore con su mdico con respecto a lo siguiente: ? Controle toda afeccin a Barrister's clerk (crnica) que tenga, como diabetes, hipertensin arterial, enfermedad tiroidea o de la vescula biliar. ? Controle los efectos secundarios de su tratamiento.  No consuma ningn producto que contenga nicotina o tabaco, como cigarrillos y Psychologist, sport and exercise. Si necesita ayuda para dejar de fumar, consulte al mdico.  Considere unirse a un grupo de apoyo para que la ayuden a Theatre manager. Su mdico podra recomendarle un grupo de apoyo local o virtual (en lnea).  Concurra a todas las visitas de control como se lo haya indicado el mdico. Esto es importante. Dnde encontrar ms informacin  Sociedad Estadounidense del Hotel manager ([place]): https://www.cancer.[place] https://www.cancer.gov Comunquese con un mdico si:  Tiene dolor en el abdomen o la pelvis que empeora.  No puede orinar.  Tiene un sangrado anormal.  Tiene fiebre. Solicite ayuda de inmediato si:  Tiene sntomas agudos nuevos o repentinos, tales como: ? Sangrado abundante. ? Debilitamiento grave. ? Dolor intenso que no mejora con medicamentos. Resumen  El cncer de tero es un crecimiento anormal de tejido canceroso (tumor maligno) en el tero. El tipo ms frecuente de cncer de tero comienza en el endometrio (cncer de endometrio).  Esta enfermedad a menudo se trata con [place] para extirpar el tero, el cuello uterino, las trompas de  [place]alopio y los ovarios (histerectoma total) o el tero y el cuello uterino (histerectoma simple).  Trabaje con su mdico para controlar toda afeccin a Barrister's clerk (crnica) que tenga, como diabetes, hipertensin arterial, enfermedad tiroidea o  de la vescula biliar.  Considere unirse a un grupo de apoyo para que la ayuden a Theatre manager. Su mdico podra recomendarle un grupo de apoyo local o virtual (en lnea). Esta informacin no tiene Marine scientist el consejo del mdico. Asegrese de hacerle al mdico cualquier pregunta que tenga. Document Revised: 10/12/2016 Document Reviewed: 10/12/2016 Elsevier Patient Education  [place].

## 2020-04-25 ENCOUNTER — Other Ambulatory Visit: Payer: Self-pay

## 2020-04-25 ENCOUNTER — Encounter: Payer: Self-pay | Admitting: Family Medicine

## 2020-04-25 ENCOUNTER — Ambulatory Visit: Payer: 59 | Attending: Family Medicine | Admitting: Family Medicine

## 2020-04-25 VITALS — BP 113/74 | HR 105 | Temp 99.0°F | Ht <= 58 in | Wt 148.8 lb

## 2020-04-25 DIAGNOSIS — E1149 Type 2 diabetes mellitus with other diabetic neurological complication: Secondary | ICD-10-CM

## 2020-04-25 DIAGNOSIS — Z23 Encounter for immunization: Secondary | ICD-10-CM | POA: Diagnosis not present

## 2020-04-25 DIAGNOSIS — E1159 Type 2 diabetes mellitus with other circulatory complications: Secondary | ICD-10-CM

## 2020-04-25 DIAGNOSIS — E785 Hyperlipidemia, unspecified: Secondary | ICD-10-CM

## 2020-04-25 DIAGNOSIS — E1169 Type 2 diabetes mellitus with other specified complication: Secondary | ICD-10-CM | POA: Diagnosis not present

## 2020-04-25 DIAGNOSIS — R1032 Left lower quadrant pain: Secondary | ICD-10-CM

## 2020-04-25 DIAGNOSIS — Z1231 Encounter for screening mammogram for malignant neoplasm of breast: Secondary | ICD-10-CM | POA: Diagnosis not present

## 2020-04-25 DIAGNOSIS — I152 Hypertension secondary to endocrine disorders: Secondary | ICD-10-CM

## 2020-04-25 LAB — POCT GLYCOSYLATED HEMOGLOBIN (HGB A1C): HbA1c, POC (controlled diabetic range): 7.1 % — AB (ref 0.0–7.0)

## 2020-04-25 LAB — GLUCOSE, POCT (MANUAL RESULT ENTRY): POC Glucose: 210 mg/dl — AB (ref 70–99)

## 2020-04-25 MED ORDER — SIMETHICONE 80 MG PO TABS: 1.0000 | ORAL_TABLET | Freq: Three times a day (TID) | ORAL | 2 refills | Status: DC

## 2020-04-25 MED ORDER — HYDROCHLOROTHIAZIDE 25 MG PO TABS: 25.0000 mg | ORAL_TABLET | Freq: Every day | ORAL | 6 refills | Status: DC

## 2020-04-25 MED ORDER — CANAGLIFLOZIN 100 MG PO TABS: 100.0000 mg | ORAL_TABLET | Freq: Every day | ORAL | 6 refills | Status: DC

## 2020-04-25 MED ORDER — GABAPENTIN 300 MG PO CAPS: 300.0000 mg | ORAL_CAPSULE | Freq: Two times a day (BID) | ORAL | 6 refills | Status: DC

## 2020-04-25 MED ORDER — LISINOPRIL 5 MG PO TABS: ORAL_TABLET | ORAL | 6 refills | Status: DC

## 2020-04-25 MED ORDER — ATORVASTATIN CALCIUM 40 MG PO TABS: ORAL_TABLET | ORAL | 6 refills | Status: DC

## 2020-04-25 MED ORDER — GLIPIZIDE 5 MG PO TABS: ORAL_TABLET | ORAL | 6 refills | Status: DC

## 2020-04-25 MED ORDER — METFORMIN HCL 1000 MG PO TABS: ORAL_TABLET | ORAL | 6 refills | Status: DC

## 2020-04-25 NOTE — Progress Notes (Signed)
Having pain in lower abdomen.

## 2020-04-25 NOTE — Patient Instructions (Signed)
Neuropata diabtica Diabetic Neuropathy La neuropata diabtica hace referencia al dao nervioso ocasionado por la diabetes (diabetes mellitus). Con [employer], las personas diabticas pueden sufrir un dao en los nervios de todo el cuerpo. Hay varios tipos de neuropata diabtica:  Neuropata perifrica. Este es el tipo ms frecuente de neuropata diabtica. Causa daos en los nervios que transportan seales entre la mdula espinal y otras partes del cuerpo (nervios perifricos). Por lo general, suelen verse afectados los nervios de los pies y las piernas en primero lugar y, con el Ebro, los de las manos y [place]. El dao afecta la capacidad de sentir con el tacto o de sentir la temperatura.  Neuropata autnoma. Este tipo causa dao en los nervios que controlan las funciones involuntarias (nervios autnomos). Estos nervios transmiten las seales que controlan: ? El latido cardaco. ? La Firefighter. ? La presin arterial. ? La miccin. ? La digestin. ? Sudoracin. ? La funcin sexual. ? La respuesta al cambio de los niveles de azcar (glucosa) en la [place].  Neuropata focal. Este tipo de dao nervioso afecta una zona del cuerpo, como un brazo, una pierna o el rostro. La lesin puede comprometer un nervio o un pequeo grupo de nervios. La neuropata focal puede ser dolorosa e impredecible y ocurre con ms frecuencia en adultos mayores con diabetes. Esta se manifiesta de forma repentina, aunque generalmente mejora con el tiempo y no causa problemas a Barrister's clerk.  Neuropata proximal. Este tipo de dao nervioso afecta los nervios de los muslos, las caderas, las nalgas o las piernas. Causa dolor intenso, debilidad y [place] del tejido muscular (atrofia), generalmente en los msculos de los muslos. Es ms frecuente en los hombres Nordstrom y las personas que tienen diabetes de tipo2. El tiempo de recuperacin puede variar. Cules son las causas? Las neuropatas perifricas,  autnomas y focales son consecuencia de la diabetes que no se controla correctamente con Lexicographer. Si bien se desconoce la causa de la neuropata proximal, esta puede ser provocada por una inflamacin relacionada con los niveles no controlados de glucemia. Cules son los signos o los sntomas? Neuropata perifrica La neuropata perifrica se presenta lentamente a lo largo del tiempo. Cuando los nervios de los pies y las piernas ya no funcionan, usted puede sentir:  Merchandiser, retail, sensacin pulstil o dolor en las piernas o los pies.  Dolor o Designer, industrial/product las piernas o los pies.  Prdida de la sensibilidad (adormecimiento) e incapacidad para sentir presin o dolor en los pies. Estas pueden ser las consecuencias: ? Callosidades gruesas o llagas en las zonas de presin constante. ? lceras. ? Capacidad reducida para sentir los cambios de temperatura.  Deformidades en el pie.  Debilidad muscular.  Prdida del equilibrio o de la coordinacin. Neuropata autnoma Los sntomas varan segn qu nervios estn afectados. Entre los sntomas se pueden incluir los siguientes:  Problemas digestivos, como los siguientes: ? Nuseas o vmitos. ? Prdida del apetito. ? Meteorismo. ? Diarrea o constipacin. ? Dificultad para tragar. ? Prdida de peso involuntaria.  Problemas con el corazn, la sangre y los pulmones, por ejemplo: ? Mareos, especialmente al ponerse de pie. ? Desmayos. ? Falta de aire. ? Latidos cardacos irregulares.  Problemas de la vejiga, por ejemplo: ? Dificultad para comenzar o detener la miccin. ? Prdida de [place]. ? Problemas para vaciar la vejiga. ? Infecciones en las vas urinarias.  Problemas con otras funciones del cuerpo, por ejemplo: ? La transpiracin. Puede transpirar demasiado o muy poco. ? La temperatura. Se  puede acalorar fcilmente. O puede sentir ms fro de lo habitual. ? La funcin sexual. Es posible que los hombres no puedan lograr una ereccin o  [employer]. Las mujeres pueden tener sequedad vaginal y problemas de excitacin sexual. Neuropata focal Los sntomas afectan solamente una zona del cuerpo. Entre los sntomas ms frecuentes, se incluyen los siguientes:  Entumecimiento.  Hormigueo.  Sensacin de [employer].  Sensacin de pinchazos.  Piel muy sensible.  Debilidad.  Incapacidad para moverse (parlisis).  Fasciculaciones (temblores musculares).  Disminucin (prdida) de la masa muscular.  Coordinacin deficiente.  Visin doble o borrosa. Neuropata proximal  Dolor intenso y repentino en la cadera, los muslos o las [employer]. Dolor que baja por la espalda hasta las piernas (citica).  Dolor y Science writer los brazos y la piernas.  Hormigueo.  Prdida del control del intestino o de la vejiga.  Debilidad y desgaste de los msculos del muslo.  Dificultad para levantarse desde una posicin de sentado.  Hinchazn abdominal.  Prdida de peso sin causa aparente. Cmo se diagnostica? El diagnstico generalmente implica revisar sus antecedentes mdicos y los sntomas que tenga. El diagnstico vara en funcin del tipo de neuropata que el mdico puede sospechar. Neuropata perifrica El Chief Operating Officer los lugares afectados por el sistema nervioso (examen neurolgico), como los reflejos, el modo en que se mueve y [place] de sensibilidad. Tambin pueden hacerle otros estudios, por ejemplo:  Anlisis de [place].  Extraccin y [employer] del lquido que rodea la mdula espinal (puncin lumbar).  Exploracin por tomografa computarizada (TC).  Resonancia magntica (RM).  Esta prueba evala los nervios que controlan los msculos (electromiografa, EMG).  Estas pruebas evalan la rapidez con la que los mensajes pasan a travs de los nervios (pruebas de velocidad de la conduccin nerviosa).  La extirpacin de un pequeo fragmento del nervio para examinarlo con un microscopio (biopsia). Neuropata  autnoma Pueden hacerle estudios, por ejemplo:  Estudios para medirle la presin arterial y la frecuencia cardaca. Estos estudios peden comprender someterlo a Merchant navy officer lo sujetan de forma segura a una mesa que lo mover de una posicin horizontal (en la que estar recostado) a una posicin vertical (prueba de la mesa basculante).  Estudios de la respiracin para [employer].  Estudios para examinar cmo se mueven los alimentos a travs del aparato digestivo (estudio del vaciado gstrico).  Anlisis de [place], de transpiracin y de [place].  Ecografa de la vejiga.  Anlisis del lquido cefalorraqudeo. Neuropata focal Esta afeccin se puede diagnosticar mediante:  Un examen neurolgico.  Exploracin por tomografa computarizada (TC).  Resonancia magntica (RM).  Electromiografa (EMG).  Pruebas de velocidad de conduccin nerviosa. Neuropata proximal No hay un estudio especfico para diagnosticar este tipo de neuropata. Es posible que [facility] hagan anlisis para descartar otras posibles causas de este tipo de neuropata. Los estudios pueden incluir lo siguiente:  Radiografa de la columna vertebral y la regin lumbar.  Puncin lumbar.  Resonancia magntica (RM). Cmo se trata? El objetivo del tratamiento es impedir que el dao nervioso empeore. La parte ms importante del tratamiento es mantener el nivel de glucemia y de A1C en los valores deseados a travs del seguimiento del plan de control de su diabetes. Con el tiempo, mantener bajos los niveles de glucosa en la sangre ayuda a [employer] sntomas. En algunos casos podra necesitar que le receten un analgsico. Siga estas indicaciones en su casa:  Estilo de vida   No consuma ningn producto que contenga nicotina o tabaco, como cigarrillos  y [place]. Si necesita ayuda para dejar de fumar, consulte al mdico.  Haga actividad fsica todos [place]. Incluya ejercicios de  equilibrio y entrenamiento de Pharmacologist.  Siga un plan de alimentacin saludable.  Trabaje con su mdico para mantener la presin arterial bajo control. Instrucciones generales  Siga su plan de control de la diabetes segn se le indique. ? Contrlese los niveles de glucemia segn las indicaciones de su mdico. ? Mantenga la glucemia en el rango deseado como se lo haya indicado el mdico. ? Secondary school teacher de A1C al [employer] veces al ao o con la frecuencia que le haya indicado el mdico.  Tome los medicamentos de venta libre y los recetados solamente como se lo haya indicado el mdico. Esto incluye la insulina y los medicamentos para la diabetes.  No conduzca ni use maquinaria pesada mientras toma analgsicos recetados.  Examnese a diario la piel y los pies en busca de cortes, moretones, enrojecimiento, ampollas o llagas.  Concurra a todas las visitas de control como se lo haya indicado el mdico. Esto es importante. Comunquese con un mdico si:  Siente ardor, sensacin pulstil o dolor en las piernas o los pies.  Tiene incapacidad para sentir presin o dolor en el pie.  Aparecen problemas digestivos como: ? Nuseas. ? Vmitos. ? Meteorismo. ? Estreimiento. ? Diarrea. ? Dolor abdominal.  Tiene dificultades con la miccin, como la incapacidad para: ? Aeronautical engineer orina (incontinencia). ? Vaciar la vejiga completamente (retencin).  Tiene palpitaciones.  Se siente mareado, dbil o se desmaya al ponerse de pie. Solicite ayuda de inmediato si:  No puede orinar.  Sbitamente, siente debilidad o pierde la coordinacin.  Tiene dificultad para hablar.  Siente dolor u opresin en el pecho.  Tiene latidos cardacos irregulares.  De repente, no puede mover [employer] del cuerpo. Resumen  La neuropata diabtica hace referencia al dao nervioso ocasionado por la diabetes. Puede afectar los nervios de todo el cuerpo, causar adormecimiento y [employer] brazos,  las piernas, el tubo digestivo, el corazn y otros sistemas del cuerpo.  Mantenga la glucemia y la presin arterial en los rangos deseados segn lo indicado por su mdico. Esto puede ayudar a evitar que la neuropata empeore.  Examnese a diario la piel y los pies en busca de cortes, moretones, enrojecimiento, ampollas o llagas.  No consuma ningn producto que contenga nicotina o tabaco, como cigarrillos y Psychologist, sport and exercise. Si necesita ayuda para dejar de fumar, consulte al mdico. Esta informacin no tiene Marine scientist el consejo del mdico. Asegrese de hacerle al mdico cualquier pregunta que tenga. Document Revised: 12/03/2016 Document Reviewed: 12/03/2016 Elsevier Patient Education  [place].

## 2020-04-25 NOTE — Progress Notes (Signed)
Subjective:  Patient ID: Laura Douglas, female    DOB: 01-16-1961  Age: 59 y.o. MRN: [id number]  CC: Diabetes   HPI Laura Douglas  is a 59 year old female with a history of Lynch syndrome, colon cancer in 2009 (status post resection), endometrial cancer (status post TAHBSO LND in 08/2014 with pathology revealing stage Ia grade 1 endometrioid endometrial cancer with no lymphovascular space invasion and negative lymph nodes) type 2 diabetes mellitus (A1c 7.1) who presents today for a follow up.  Previously followed by [facility]. Last seen by GYN oncology in 03/2020;  per notes no sign of recurrence 5 years post treatment. Last seen by GI in 10/2016 with last colonoscopy in 08/2019  She complains of a 4 day history of LLQ abdominal pain which is rated as 6/10 with no associated nausea, diarrhea or constipation. States it feels like gas.She has also had epigastric discomfort and reflux which affects her sleep and is currently on omeprazole. He hands have been cramping, worse at night with associated numbness.  From a diabetes standpoint she is doing well and denies hypoglycemia or numbness in extremities.  Endorses compliance with her medications.  Past Medical History:  Diagnosis Date  . Colon cancer [facility])    possible? Patient had surgery in [place]. Denies needing adjuvant therapy. The surgery "removed a tumor from the colon".  . Constipation    occasional  . Diabetes mellitus without complication (HCC)    type 2  . Family history of uterine cancer   . Hyperlipidemia   . Hypertension   . Lynch syndrome   . Obesity   . Uterine cancer ([place]) 2016   MSH2/MSH6 LOH on IHC    Past Surgical History:  Procedure Laterality Date  . ABDOMINAL HYSTERECTOMY    . abortions     twice   . arm surgery     left arm  . COLON SURGERY    . COLON SURGERY  2010   tumor removal  . COLONOSCOPY  02/17/2018   Mansouraty ta x 2, hems  . FRACTURE SURGERY  2006    Lt lower arm  . MULTIPLE TOOTH EXTRACTIONS    . ROBOTIC ASSISTED TOTAL HYSTERECTOMY WITH BILATERAL SALPINGO OOPHERECTOMY Bilateral 09/11/2014   Procedure: ROBOTIC ASSISTED TOTAL LAPAROSCOPIC HYSTERECTOMY WITH BILATERAL SALPINGO OOPHORECTOMY LYMPH NODE DISSECTION; LYSIS OF ADHESIONS;  Surgeon: Janie Morning, MD;  Location: [facility];  Service: Gynecology;  Laterality: Bilateral;  . TUBAL LIGATION  1993  . UPPER GASTROINTESTINAL ENDOSCOPY      Family History  Problem Relation Age of Onset  . Prostate cancer Father   . Diabetes Mother   . Uterine cancer Sister 95  . Cancer Sister        colon cancer as well age 27?  Marland Kitchen Colon cancer Sister   . Uterine cancer Maternal Aunt   . Alcohol abuse Brother   . Cirrhosis Brother        cause of death  . Breast cancer Neg Hx   . Rectal cancer Neg Hx   . Stomach cancer Neg Hx     No Known Allergies  Outpatient Medications Prior to Visit  Medication Sig Dispense Refill  . atorvastatin (LIPITOR) 40 MG tablet TAKE 1 TABLET BY MOUTH ONCE DAILY WITH EVENING MEAL 30 tablet 5  . Blood Glucose Monitoring Suppl (AGAMATRIX PRESTO) w/Device KIT Check sugars before meals twice sugars 1 kit 0  . canagliflozin (INVOKANA) 100 MG TABS tablet Take 1 tablet (100 mg total)  by mouth daily before breakfast. 30 tablet 6  . glipiZIDE (GLUCOTROL) 5 MG tablet 1 tab by mouth twice daily with meals 60 tablet 6  . glucose blood (AGAMATRIX PRESTO TEST) test strip Check sugars twice daily before meals 100 each 12  . hydrochlorothiazide (HYDRODIURIL) 25 MG tablet Take 1 tablet (25 mg total) by mouth daily. 30 tablet 6  . lisinopril (ZESTRIL) 5 MG tablet 1 tab by mouth daily with HCTZ 30 tablet 6  . metFORMIN (GLUCOPHAGE) 1000 MG tablet TAKE 1 TABLET BY MOUTH TWICE DAILY WITH A MEAL 60 tablet 6  . omeprazole (PRILOSEC) 40 MG capsule Take 1 capsule by mouth twice daily 28 capsule 0  . omeprazole (PRILOSEC) 40 MG capsule Take 1 capsule (40 mg total) by mouth in the morning and at  bedtime. After 10 days reduce to once daily (Patient not taking: Reported on 04/25/2020) 90 capsule 3  . omeprazole (PRILOSEC) 40 MG capsule Take 1 capsule (40 mg total) by mouth daily. After completion of H. Pylori pt will decrease to once daily dosing. (Patient not taking: Reported on 01/24/2020) 28 capsule 0   No facility-administered medications prior to visit.     ROS Review of Systems  Constitutional: Negative for activity change, appetite change and fatigue.  HENT: Negative for congestion, sinus pressure and sore throat.   Eyes: Negative for visual disturbance.  Respiratory: Negative for cough, chest tightness, shortness of breath and wheezing.   Cardiovascular: Negative for chest pain and palpitations.  Gastrointestinal: Positive for abdominal pain. Negative for abdominal distention and constipation.  Endocrine: Negative for polydipsia.  Genitourinary: Negative for dysuria and frequency.  Musculoskeletal: Negative for arthralgias and back pain.  Skin: Negative for rash.  Neurological: Positive for numbness. Negative for tremors and light-headedness.  Hematological: Does not bruise/bleed easily.  Psychiatric/Behavioral: Negative for agitation and behavioral problems.    Objective:  BP 113/74   Pulse (!) 105   Temp 99 F (37.2 C) (Oral)   Ht '4\' 10"'  (1.473 m)   Wt 148 lb 12.8 oz (67.5 kg)   LMP  (LMP Unknown)   SpO2 97%   BMI 31.10 kg/m   BP/Weight 04/25/2020 04/03/2020 12/13/1599  Systolic BP 093 235 573  Diastolic BP 74 78 82  Wt. (Lbs) 148.8 149.3 156  BMI 31.1 31.2 30.47      Physical Exam Constitutional:      Appearance: She is well-developed.  Neck:     Vascular: No JVD.  Cardiovascular:     Rate and Rhythm: Normal rate.     Heart sounds: Normal heart sounds. No murmur heard.   Pulmonary:     Effort: Pulmonary effort is normal.     Breath sounds: Normal breath sounds. No wheezing or rales.  Chest:     Chest wall: No tenderness.  Abdominal:      General: Bowel sounds are normal. There is no distension.     Palpations: Abdomen is soft. There is no mass.     Tenderness: There is no abdominal tenderness.  Musculoskeletal:        General: Normal range of motion.     Right lower leg: No edema.     Left lower leg: No edema.  Neurological:     Mental Status: She is alert and oriented to person, place, and time.  Psychiatric:        Mood and Affect: Mood normal.     CMP Latest Ref Rng & Units 02/07/2020 08/15/2018 01/03/2018  Glucose 65 -  99 mg/dL 92 - 122(H)  BUN 6 - 24 mg/dL 18 - 11  Creatinine 0.57 - 1.00 mg/dL 0.71 - 0.71  Sodium 134 - 144 mmol/L 144 - 142  Potassium 3.5 - 5.2 mmol/L 3.8 - 4.4  Chloride 96 - 106 mmol/L 103 - 102  CO2 20 - 29 mmol/L 24 - 24  Calcium 8.7 - 10.2 mg/dL 9.8 - 9.6  Total Protein 6.0 - 8.5 g/dL 6.8 6.7 7.1  Total Bilirubin 0.0 - 1.2 mg/dL 0.4 0.4 0.4  Alkaline Phos 48 - 121 IU/L 48 56 60  AST 0 - 40 IU/L '20 30 29  ' ALT 0 - 32 IU/L 27 36(H) 30    Lipid Panel     Component Value Date/Time   CHOL 142 02/07/2020 0946   TRIG 99 02/07/2020 0946   HDL 49 02/07/2020 0946   CHOLHDL 2.9 02/07/2020 0946   CHOLHDL 6.7 (H) 06/04/2015 0925   VLDL 67 (H) 06/04/2015 0925   LDLCALC 75 02/07/2020 0946    CBC    Component Value Date/Time   WBC 6.4 11/17/2016 0852   WBC 7.5 03/12/2016 1122   RBC 4.96 11/17/2016 0852   RBC 4.77 03/12/2016 1122   HGB 13.7 11/17/2016 0852   HCT 42.8 11/17/2016 0852   PLT 237 11/17/2016 0852   MCV 86 11/17/2016 0852   MCH 27.6 11/17/2016 0852   MCH 28.7 03/12/2016 1122   MCHC 32.0 11/17/2016 0852   MCHC 33.1 03/12/2016 1122   RDW 13.1 11/17/2016 0852   LYMPHSABS 2.7 11/17/2016 0852   MONOABS 0.4 03/12/2016 1122   EOSABS 0.2 11/17/2016 0852   BASOSABS 0.0 11/17/2016 0852    Lab Results  Component Value Date   HGBA1C 7.1 (A) 04/25/2020    Assessment & Plan:  1. Type 2 diabetes mellitus with other neurologic complication, without long-term current use of insulin  (HCC) Controlled with A1c of 7.1 Continue current regimen Counseled on Diabetic diet, my plate method, 280 minutes of moderate intensity exercise/week Blood sugar logs with fasting goals of 80-120 mg/dl, random of less than 180 and in the event of sugars less than 60 mg/dl or greater than 400 mg/dl encouraged to notify the clinic. Advised on the need for annual eye exams, annual foot exams, Pneumonia vaccine. - POCT glucose (manual entry) - POCT glycosylated hemoglobin (Hb A1C) - glipiZIDE (GLUCOTROL) 5 MG tablet; 1 tab by mouth twice daily with meals  Dispense: 60 tablet; Refill: 6 - canagliflozin (INVOKANA) 100 MG TABS tablet; Take 1 tablet (100 mg total) by mouth daily before breakfast.  Dispense: 30 tablet; Refill: 6 - gabapentin (NEURONTIN) 300 MG capsule; Take 1 capsule (300 mg total) by mouth 2 (two) times daily.  Dispense: 60 capsule; Refill: 6 - Basic Metabolic Panel  2. Hyperlipidemia associated with type 2 diabetes mellitus (HCC) Controlled Low-cholesterol diet - metFORMIN (GLUCOPHAGE) 1000 MG tablet; TAKE 1 TABLET BY MOUTH TWICE DAILY WITH A MEAL  Dispense: 60 tablet; Refill: 6 - atorvastatin (LIPITOR) 40 MG tablet; TAKE 1 TABLET BY MOUTH ONCE DAILY WITH EVENING MEAL  Dispense: 30 tablet; Refill: 6  3. Hypertension complicating diabetes ([place]) Controlled Counseled on blood pressure goal of less than 130/80, low-sodium, DASH diet, medication compliance, 150 minutes of moderate intensity exercise per week. Discussed medication compliance, adverse effects. - lisinopril (ZESTRIL) 5 MG tablet; 1 tab by mouth daily with HCTZ  Dispense: 30 tablet; Refill: 6 - hydrochlorothiazide (HYDRODIURIL) 25 MG tablet; Take 1 tablet (25 mg total) by mouth daily.  Dispense: 30 tablet; Refill: 6  4. Encounter for screening mammogram for malignant neoplasm of breast - MM 3D SCREEN BREAST BILATERAL; Future  5. Need for immunization against influenza - Flu Vaccine QUAD 36+ mos IM  6. Left lower  quadrant abdominal pain We will treat for gas pains Advised to keep a symptom diary and possible foods that trigger so we can work on elimination. She does have uncontrolled GERD and has been advised to reach out to her gastroenterologist as she might benefit from an upper endoscopy   Return in about 6 months (around 10/23/2020) for Chronic disease management.       Charlott Rakes, MD, FAAFP. [facility] [place], [place]   04/25/2020, 9:53 AM

## 2020-04-26 ENCOUNTER — Telehealth: Payer: Self-pay

## 2020-04-26 LAB — BASIC METABOLIC PANEL
BUN/Creatinine Ratio: 20 (ref 9–23)
BUN: 17 mg/dL (ref 6–24)
CO2: 25 mmol/L (ref 20–29)
Calcium: 10.3 mg/dL — ABNORMAL HIGH (ref 8.7–10.2)
Chloride: 98 mmol/L (ref 96–106)
Creatinine, Ser: 0.87 mg/dL (ref 0.57–1.00)
GFR calc Af Amer: 86 mL/min/{1.73_m2} (ref >59)
GFR calc non Af Amer: 74 mL/min/{1.73_m2} (ref >59)
Glucose: 172 mg/dL — ABNORMAL HIGH (ref 65–99)
Potassium: 4 mmol/L (ref 3.5–5.2)
Sodium: 139 mmol/L (ref 134–144)

## 2020-04-26 NOTE — Telephone Encounter (Signed)
-----   Message from Charlott Rakes, MD sent at 04/26/2020  9:22 AM EST ----- Labs are stable

## 2020-04-26 NOTE — Telephone Encounter (Signed)
Patient name and DOB has been verified Patient was informed of lab results. Patient had no questions.  

## 2020-06-18 ENCOUNTER — Other Ambulatory Visit: Payer: Self-pay

## 2020-06-18 ENCOUNTER — Ambulatory Visit
Admission: RE | Admit: 2020-06-18 | Discharge: 2020-06-18 | Disposition: A | Discharge status: Home / Self Care | Payer: 59 | Source: Ambulatory Visit | Attending: Family Medicine | Admitting: Family Medicine

## 2020-06-18 ENCOUNTER — Other Ambulatory Visit: Payer: Self-pay | Admitting: Gastroenterology

## 2020-06-18 DIAGNOSIS — Z1231 Encounter for screening mammogram for malignant neoplasm of breast: Secondary | ICD-10-CM

## 2020-08-29 ENCOUNTER — Telehealth: Payer: Self-pay

## 2020-08-29 DIAGNOSIS — R7611 Nonspecific reaction to tuberculin skin test without active tuberculosis: Secondary | ICD-10-CM

## 2020-08-29 NOTE — Telephone Encounter (Signed)
Patient called requesting to have an order placed for a\n X-Ray. Stating that she had a TB test done on Saturday in [place] and it came back positive. Patient was advice to contact her PCP to have an X-RAY done.  Please advice [phone]

## 2020-08-29 NOTE — Telephone Encounter (Signed)
Pt needs order for chest xray

## 2020-08-30 NOTE — Addendum Note (Signed)
Addended byCharlott Rakes on: 08/30/2020 08:58 AM   Modules accepted: Orders

## 2020-08-30 NOTE — Telephone Encounter (Signed)
Order has been placed.

## 2020-09-02 ENCOUNTER — Telehealth: Payer: Self-pay | Admitting: Family Medicine

## 2020-09-02 NOTE — Telephone Encounter (Signed)
Pt is requesting for Chest Xray to be scheduled asap. Pt states she had a TB test in [place] about 2 weeks ago and tested positive. Pt states she needs to have it done by Friday April 22nd, 2022 to turn the results in for Immigration process.

## 2020-09-02 NOTE — Telephone Encounter (Signed)
Please call patient and inform her to go to [facility] to get Xray done order is placed.

## 2020-09-03 ENCOUNTER — Other Ambulatory Visit: Payer: Self-pay

## 2020-09-03 ENCOUNTER — Ambulatory Visit (HOSPITAL_COMMUNITY)
Admission: RE | Admit: 2020-09-03 | Discharge: 2020-09-03 | Disposition: A | Discharge status: Home / Self Care | Payer: 59 | Source: Ambulatory Visit | Attending: Family Medicine | Admitting: Family Medicine

## 2020-09-03 DIAGNOSIS — R7611 Nonspecific reaction to tuberculin skin test without active tuberculosis: Secondary | ICD-10-CM | POA: Diagnosis not present

## 2020-09-03 NOTE — Telephone Encounter (Signed)
Pt called and using interpreter, pt given the information that the order is in for the x ray. Pt verbalized understanding and relayed to me that she was going to go today to have it done.

## 2020-09-04 NOTE — Telephone Encounter (Signed)
Order has been placed for Xray she can go to [facility] for [employer]

## 2020-09-06 NOTE — Progress Notes (Signed)
Pt was informed of Xray results via front desk staff. Pt was given a copy of the results.

## 2020-10-03 ENCOUNTER — Encounter: Payer: 59 | Admitting: Family Medicine

## 2020-10-14 ENCOUNTER — Other Ambulatory Visit: Payer: Self-pay

## 2020-10-14 ENCOUNTER — Encounter: Payer: Self-pay | Admitting: Family Medicine

## 2020-10-14 ENCOUNTER — Ambulatory Visit: Payer: 59 | Attending: Family Medicine | Admitting: Family Medicine

## 2020-10-14 VITALS — BP 108/69 | HR 87 | Ht <= 58 in | Wt 142.0 lb

## 2020-10-14 DIAGNOSIS — Z1211 Encounter for screening for malignant neoplasm of colon: Secondary | ICD-10-CM

## 2020-10-14 DIAGNOSIS — Z0000 Encounter for general adult medical examination without abnormal findings: Secondary | ICD-10-CM

## 2020-10-14 DIAGNOSIS — Z0001 Encounter for general adult medical examination with abnormal findings: Secondary | ICD-10-CM

## 2020-10-14 DIAGNOSIS — E1159 Type 2 diabetes mellitus with other circulatory complications: Secondary | ICD-10-CM

## 2020-10-14 DIAGNOSIS — E785 Hyperlipidemia, unspecified: Secondary | ICD-10-CM

## 2020-10-14 DIAGNOSIS — I152 Hypertension secondary to endocrine disorders: Secondary | ICD-10-CM

## 2020-10-14 DIAGNOSIS — E1169 Type 2 diabetes mellitus with other specified complication: Secondary | ICD-10-CM | POA: Diagnosis not present

## 2020-10-14 DIAGNOSIS — K219 Gastro-esophageal reflux disease without esophagitis: Secondary | ICD-10-CM

## 2020-10-14 DIAGNOSIS — E1149 Type 2 diabetes mellitus with other diabetic neurological complication: Secondary | ICD-10-CM | POA: Diagnosis not present

## 2020-10-14 DIAGNOSIS — I10 Essential (primary) hypertension: Secondary | ICD-10-CM

## 2020-10-14 MED ORDER — GLIPIZIDE 5 MG PO TABS: ORAL_TABLET | ORAL | 6 refills | Status: DC

## 2020-10-14 MED ORDER — HYDROCHLOROTHIAZIDE 25 MG PO TABS: 25.0000 mg | ORAL_TABLET | Freq: Every day | ORAL | 6 refills | Status: DC

## 2020-10-14 MED ORDER — METFORMIN HCL 1000 MG PO TABS: ORAL_TABLET | ORAL | 6 refills | Status: DC

## 2020-10-14 MED ORDER — LISINOPRIL 5 MG PO TABS
ORAL_TABLET | ORAL | 6 refills | Status: DC
Start: 2020-10-14 — End: 2021-04-22

## 2020-10-14 MED ORDER — CANAGLIFLOZIN 100 MG PO TABS: 100.0000 mg | ORAL_TABLET | Freq: Every day | ORAL | 6 refills | Status: DC

## 2020-10-14 MED ORDER — GABAPENTIN 300 MG PO CAPS: 300.0000 mg | ORAL_CAPSULE | Freq: Two times a day (BID) | ORAL | 6 refills | Status: DC

## 2020-10-14 MED ORDER — OMEPRAZOLE 40 MG PO CPDR: 40.0000 mg | DELAYED_RELEASE_CAPSULE | Freq: Every day | ORAL | 1 refills | Status: DC

## 2020-10-14 MED ORDER — ATORVASTATIN CALCIUM 40 MG PO TABS
ORAL_TABLET | ORAL | 6 refills | Status: DC
Start: 2020-10-14 — End: 2021-04-22

## 2020-10-14 NOTE — Progress Notes (Signed)
Subjective:  Patient ID: Laura Douglas, female    DOB: 06-06-1961  Age: 60 y.o. MRN: [id number]  CC: Annual Exam   HPI Laura Douglas is a 60 year old female with a history of Lynch syndrome, colon cancer in 2009 (status post resection), endometrial cancer (status postTAHBSO LND in 3/2016with pathology revealing stage Ia grade 1 endometrioid endometrial cancer with no lymphovascular space invasion and negative lymph nodes)type 2 diabetes mellitus (A1c 7.1) who presents today for an annual exam. Mammogram from 06/2020 was negative for malignancy. Last seen by GYN for follow-up in 03/2020.  Her last colonoscopy was in 08/2019 and sessile polyp was removed with pathology negative for carcinoma.  She tested positive for H. pylori which was treated. She complains of reflux but has no abdominal pain, nausea or vomiting. Endorses eating late. She has no hematochezia, hematemesis, constipation or diarrhea. With regards to her diabetes mellitus she is compliant with her diabetic regimen, is doing well on her antihypertensive and her statin. Past Medical History:  Diagnosis Date  . Colon cancer [facility])    possible? Patient had surgery in [place]. Denies needing adjuvant therapy. The surgery "removed a tumor from the colon".  . Constipation    occasional  . Diabetes mellitus without complication (HCC)    type 2  . Family history of uterine cancer   . Hyperlipidemia   . Hypertension   . Lynch syndrome   . Obesity   . Uterine cancer ([place]) 2016   MSH2/MSH6 LOH on IHC    Past Surgical History:  Procedure Laterality Date  . ABDOMINAL HYSTERECTOMY    . abortions     twice   . arm surgery     left arm  . COLON SURGERY    . COLON SURGERY  2010   tumor removal  . COLONOSCOPY  02/17/2018   Mansouraty ta x 2, hems  . FRACTURE SURGERY  2006   Lt lower arm  . MULTIPLE TOOTH EXTRACTIONS    . ROBOTIC ASSISTED TOTAL HYSTERECTOMY WITH BILATERAL SALPINGO OOPHERECTOMY Bilateral  09/11/2014   Procedure: ROBOTIC ASSISTED TOTAL LAPAROSCOPIC HYSTERECTOMY WITH BILATERAL SALPINGO OOPHORECTOMY LYMPH NODE DISSECTION; LYSIS OF ADHESIONS;  Surgeon: Janie Morning, MD;  Location: [facility];  Service: Gynecology;  Laterality: Bilateral;  . TUBAL LIGATION  1993  . UPPER GASTROINTESTINAL ENDOSCOPY      Family History  Problem Relation Age of Onset  . Prostate cancer Father   . Diabetes Mother   . Uterine cancer Sister 29  . Cancer Sister        colon cancer as well age 67?  Marland Kitchen Colon cancer Sister   . Uterine cancer Maternal Aunt   . Alcohol abuse Brother   . Cirrhosis Brother        cause of death  . Breast cancer Neg Hx   . Rectal cancer Neg Hx   . Stomach cancer Neg Hx     No Known Allergies  Outpatient Medications Prior to Visit  Medication Sig Dispense Refill  . Blood Glucose Monitoring Suppl (AGAMATRIX PRESTO) w/Device KIT Check sugars before meals twice sugars 1 kit 0  . glucose blood (AGAMATRIX PRESTO TEST) test strip Check sugars twice daily before meals 100 each 12  . Simethicone 80 MG TABS Take 1 tablet (80 mg total) by mouth in the morning, at noon, and at bedtime. 60 tablet 2  . atorvastatin (LIPITOR) 40 MG tablet TAKE 1 TABLET BY MOUTH ONCE DAILY WITH EVENING MEAL 30 tablet 6  . canagliflozin (  INVOKANA) 100 MG TABS tablet Take 1 tablet (100 mg total) by mouth daily before breakfast. 30 tablet 6  . gabapentin (NEURONTIN) 300 MG capsule Take 1 capsule (300 mg total) by mouth 2 (two) times daily. 60 capsule 6  . glipiZIDE (GLUCOTROL) 5 MG tablet 1 tab by mouth twice daily with meals 60 tablet 6  . hydrochlorothiazide (HYDRODIURIL) 25 MG tablet Take 1 tablet (25 mg total) by mouth daily. 30 tablet 6  . lisinopril (ZESTRIL) 5 MG tablet 1 tab by mouth daily with HCTZ 30 tablet 6  . metFORMIN (GLUCOPHAGE) 1000 MG tablet TAKE 1 TABLET BY MOUTH TWICE DAILY WITH A MEAL 60 tablet 6  . omeprazole (PRILOSEC) 40 MG capsule Take 1 capsule by mouth twice daily 28 capsule 0   . omeprazole (PRILOSEC) 40 MG capsule Take 1 capsule (40 mg total) by mouth in the morning and at bedtime. After 10 days reduce to once daily (Patient not taking: No sig reported) 90 capsule 3  . omeprazole (PRILOSEC) 40 MG capsule Take 1 capsule (40 mg total) by mouth daily. After completion of H. Pylori pt will decrease to once daily dosing. (Patient not taking: No sig reported) 28 capsule 0   No facility-administered medications prior to visit.     ROS Review of Systems  Constitutional: Negative for activity change, appetite change and fatigue.  HENT: Negative for congestion, sinus pressure and sore throat.   Eyes: Negative for visual disturbance.  Respiratory: Negative for cough, chest tightness, shortness of breath and wheezing.   Cardiovascular: Negative for chest pain and palpitations.  Gastrointestinal: Negative for abdominal distention, abdominal pain and constipation.  Endocrine: Negative for polydipsia.  Genitourinary: Negative for dysuria and frequency.  Musculoskeletal: Negative for arthralgias and back pain.  Skin: Negative for rash.  Neurological: Negative for tremors, light-headedness and numbness.  Hematological: Does not bruise/bleed easily.  Psychiatric/Behavioral: Negative for agitation and behavioral problems.    Objective:  BP 108/69   Pulse 87   Ht _0  (1.473 m)   Wt 142 lb (64.4 kg)   LMP  (LMP Unknown)   SpO2 98%   BMI 29.68 kg/m   BP/Weight 10/14/2020 04/25/2020 40/76/8088  Systolic BP 110 315 945  Diastolic BP 69 74 78  Wt. (Lbs) 142 148.8 149.3  BMI 29.68 31.1 31.2      Physical Exam Constitutional:      General: She is not in acute distress.    Appearance: She is well-developed. She is not diaphoretic.  HENT:     Head: Normocephalic.     Right Ear: External ear normal.     Left Ear: External ear normal.     Nose: Nose normal.  Eyes:     Conjunctiva/sclera: Conjunctivae normal.     Pupils: Pupils are equal, round, and reactive to  light.  Neck:     Vascular: No JVD.  Cardiovascular:     Rate and Rhythm: Normal rate and regular rhythm.     Heart sounds: Normal heart sounds. No murmur heard. No gallop.   Pulmonary:     Effort: Pulmonary effort is normal. No respiratory distress.     Breath sounds: Normal breath sounds. No wheezing or rales.  Chest:     Chest wall: No tenderness.  Abdominal:     General: Bowel sounds are normal. There is no distension.     Palpations: Abdomen is soft. There is no mass.     Tenderness: There is no abdominal tenderness.  Musculoskeletal:  General: No tenderness. Normal range of motion.     Cervical back: Normal range of motion.  Skin:    General: Skin is warm and dry.  Neurological:     Mental Status: She is alert and oriented to person, place, and time.     Deep Tendon Reflexes: Reflexes are normal and symmetric.     CMP Latest Ref Rng & Units 04/25/2020 02/07/2020 08/15/2018  Glucose 65 - 99 mg/dL 172(H) 92 -  BUN 6 - 24 mg/dL 17 18 -  Creatinine 0.57 - 1.00 mg/dL 0.87 0.71 -  Sodium 134 - 144 mmol/L 139 144 -  Potassium 3.5 - 5.2 mmol/L 4.0 3.8 -  Chloride 96 - 106 mmol/L 98 103 -  CO2 20 - 29 mmol/L 25 24 -  Calcium 8.7 - 10.2 mg/dL 10.3(H) 9.8 -  Total Protein 6.0 - 8.5 g/dL - 6.8 6.7  Total Bilirubin 0.0 - 1.2 mg/dL - 0.4 0.4  Alkaline Phos 48 - 121 IU/L - 48 56  AST 0 - 40 IU/L - 20 30  ALT 0 - 32 IU/L - 27 36(H)    Lipid Panel     Component Value Date/Time   CHOL 142 02/07/2020 0946   TRIG 99 02/07/2020 0946   HDL 49 02/07/2020 0946   CHOLHDL 2.9 02/07/2020 0946   CHOLHDL 6.7 (H) 06/04/2015 0925   VLDL 67 (H) 06/04/2015 0925   LDLCALC 75 02/07/2020 0946    CBC    Component Value Date/Time   WBC 6.4 11/17/2016 0852   WBC 7.5 03/12/2016 1122   RBC 4.96 11/17/2016 0852   RBC 4.77 03/12/2016 1122   HGB 13.7 11/17/2016 0852   HCT 42.8 11/17/2016 0852   PLT 237 11/17/2016 0852   MCV 86 11/17/2016 0852   MCH 27.6 11/17/2016 0852   MCH 28.7  03/12/2016 1122   MCHC 32.0 11/17/2016 0852   MCHC 33.1 03/12/2016 1122   RDW 13.1 11/17/2016 0852   LYMPHSABS 2.7 11/17/2016 0852   MONOABS 0.4 03/12/2016 1122   EOSABS 0.2 11/17/2016 0852   BASOSABS 0.0 11/17/2016 0852    Lab Results  Component Value Date   HGBA1C 7.1 (A) 04/25/2020    Assessment & Plan:  1. Annual physical exam Counseled on 150 minutes of exercise per week, healthy eating (including decreased daily intake of saturated fats, cholesterol, added sugars, sodium), STI prevention, routine healthcare maintenance.  2. Screening for colon cancer Due to history of Lynch syndrome she is in the 1 year frequency. - Ambulatory referral to Gastroenterology  3. Gastroesophageal reflux disease without esophagitis Advised against eating late Prescription for omeprazole sent to her pharmacy  4. Type 2 diabetes mellitus with other neurologic complication, without long-term current use of insulin (HCC) Controlled with A1c of 7.1 Will send of A1c and adjust regimen accordingly Counseled on Diabetic diet, my plate method, 951 minutes of moderate intensity exercise/week Blood sugar logs with fasting goals of 80-120 mg/dl, random of less than 180 and in the event of sugars less than 60 mg/dl or greater than 400 mg/dl encouraged to notify the clinic. Advised on the need for annual eye exams, annual foot exams, Pneumonia vaccine. - CMP14+EGFR; Future - Lipid panel; Future - Microalbumin / creatinine urine ratio; Future - Hemoglobin A1c; Future - canagliflozin (INVOKANA) 100 MG TABS tablet; Take 1 tablet (100 mg total) by mouth daily before breakfast.  Dispense: 30 tablet; Refill: 6 - gabapentin (NEURONTIN) 300 MG capsule; Take 1 capsule (300 mg total) by mouth  2 (two) times daily.  Dispense: 60 capsule; Refill: 6 - glipiZIDE (GLUCOTROL) 5 MG tablet; 1 tab by mouth twice daily with meals  Dispense: 60 tablet; Refill: 6  5. Hyperlipidemia associated with type 2 diabetes mellitus  (HCC) LDL of 75 slightly above goal of less than 70 Send of lipid panel Low-cholesterol diet - atorvastatin (LIPITOR) 40 MG tablet; TAKE 1 TABLET BY MOUTH ONCE DAILY WITH EVENING MEAL  Dispense: 30 tablet; Refill: 6 - metFORMIN (GLUCOPHAGE) 1000 MG tablet; TAKE 1 TABLET BY MOUTH TWICE DAILY WITH A MEAL  Dispense: 60 tablet; Refill: 6  6. Hypertension complicating diabetes ([place]) Controlled Counseled on blood pressure goal of less than 130/80, low-sodium, DASH diet, medication compliance, 150 minutes of moderate intensity exercise per week. Discussed medication compliance, adverse effects. - hydrochlorothiazide (HYDRODIURIL) 25 MG tablet; Take 1 tablet (25 mg total) by mouth daily.  Dispense: 30 tablet; Refill: 6 - lisinopril (ZESTRIL) 5 MG tablet; 1 tab by mouth daily with HCTZ  Dispense: 30 tablet; Refill: 6   Meds ordered this encounter  Medications  . omeprazole (PRILOSEC) 40 MG capsule    Sig: Take 1 capsule (40 mg total) by mouth daily.    Dispense:  30 capsule    Refill:  1  . atorvastatin (LIPITOR) 40 MG tablet    Sig: TAKE 1 TABLET BY MOUTH ONCE DAILY WITH EVENING MEAL    Dispense:  30 tablet    Refill:  6  . canagliflozin (INVOKANA) 100 MG TABS tablet    Sig: Take 1 tablet (100 mg total) by mouth daily before breakfast.    Dispense:  30 tablet    Refill:  6  . gabapentin (NEURONTIN) 300 MG capsule    Sig: Take 1 capsule (300 mg total) by mouth 2 (two) times daily.    Dispense:  60 capsule    Refill:  6  . glipiZIDE (GLUCOTROL) 5 MG tablet    Sig: 1 tab by mouth twice daily with meals    Dispense:  60 tablet    Refill:  6  . hydrochlorothiazide (HYDRODIURIL) 25 MG tablet    Sig: Take 1 tablet (25 mg total) by mouth daily.    Dispense:  30 tablet    Refill:  6  . lisinopril (ZESTRIL) 5 MG tablet    Sig: 1 tab by mouth daily with HCTZ    Dispense:  30 tablet    Refill:  6  . metFORMIN (GLUCOPHAGE) 1000 MG tablet    Sig: TAKE 1 TABLET BY MOUTH TWICE DAILY WITH A MEAL     Dispense:  60 tablet    Refill:  6    Follow-up: Return in about 6 months (around 04/16/2021) for Chronic disease management.       Charlott Rakes, MD, FAAFP. [facility] [place], [place]   10/14/2020, 10:48 AM

## 2020-10-14 NOTE — Patient Instructions (Signed)
Mantenimiento de Technical sales engineer en [place] Maintenance, Female Adoptar un estilo de vida saludable y recibir atencin preventiva son importantes para promover la salud y Musician. Consulte al mdico sobre:  El esquema adecuado para hacerse pruebas y exmenes peridicos.  Cosas que puede hacer por su cuenta para prevenir enfermedades y [employer]. Qu debo saber sobre la dieta, el peso y el ejercicio? Consuma una dieta saludable  Consuma una dieta que incluya muchas verduras, frutas, productos lcteos con bajo contenido de [place] y Advertising account planner.  No consuma muchos alimentos ricos en grasas slidas, azcares agregados o sodio.   Mantenga un peso saludable El ndice de masa muscular [facility]) se [place] para identificar problemas de [place]. Proporciona una estimacin de la grasa corporal basndose en el peso y la altura. Su mdico puede ayudarle a Radiation protection practitioner [place] y a Scientist, forensic o Theatre manager un peso saludable. Haga ejercicio con regularidad Haga ejercicio con regularidad. Esta es una de las prcticas ms importantes que puede hacer por su salud. La mayora de los adultos deben seguir estas pautas:  Optometrist, al menos, 133minutos de actividad fsica por semana. El ejercicio debe aumentar la frecuencia cardaca y Nature conservation officer transpirar (ejercicio de intensidad moderada).  Hacer ejercicios de fortalecimiento por lo [employer] por semana. Agregue esto a su plan de ejercicio de intensidad moderada.  Pasar menos tiempo sentados. Incluso la actividad fsica ligera puede ser beneficiosa. Controle sus niveles de colesterol y lpidos en la sangre Comience a realizarse anlisis de lpidos y Research officer, trade union en la sangre a los 20aos y luego reptalos cada 5aos. Hgase controlar los niveles de colesterol con mayor frecuencia si:  Sus niveles de lpidos y colesterol son altos.  Es mayor de 40aos.  Presenta un alto riesgo de padecer enfermedades cardacas. Qu debo saber sobre las pruebas de  deteccin del cncer? Segn su historia clnica y sus antecedentes familiares, es posible que deba realizarse pruebas de deteccin del cncer en diferentes edades. Esto puede incluir pruebas de deteccin de lo siguiente:  Cncer de mama.  Cncer de cuello uterino.  Cncer colorrectal.  Cncer de piel.  Cncer de pulmn. Qu debo saber sobre la enfermedad cardaca, la diabetes y la hipertensin arterial? Presin arterial y enfermedad cardaca  La hipertensin arterial causa enfermedades cardacas y [place] el riesgo de accidente cerebrovascular. Es ms probable que esto se manifieste en las personas que tienen lecturas de presin arterial alta, tienen ascendencia africana o tienen sobrepeso.  Hgase controlar la presin arterial: ? Cada 3 a 5 aos si tiene entre 18 y 63 aos. ? Todos los aos si es mayor de [place]. Diabetes Realcese exmenes de deteccin de la diabetes con regularidad. Este anlisis revisa el nivel de azcar en la sangre en [place]. Hgase las pruebas de deteccin:  Cada tresaos despus de los 52aos de edad si tiene un peso normal y un bajo riesgo de padecer diabetes.  Con ms frecuencia y a partir de [place] edad inferior si tiene sobrepeso o un alto riesgo de padecer diabetes. Qu debo saber sobre la prevencin de infecciones? Hepatitis B Si tiene un riesgo ms alto de contraer hepatitis B, debe someterse a un examen de deteccin de este virus. Hable con el mdico para averiguar si tiene riesgo de contraer la infeccin por hepatitis B. Hepatitis C Se recomienda el anlisis a:  [employer] 1945 y 1965.  Todas las personas que tengan un riesgo de haber contrado hepatitis C. Enfermedades de transmisin sexual (ETS)  Hgase  las pruebas de deteccin de ITS, incluidas la gonorrea y la clamidia, si: ? Es sexualmente activa y es menor de [place]. ? Es mayor de 24aos, y Investment banker, operational informa que corre riesgo de tener este tipo de  infecciones. ? La actividad sexual ha cambiado desde que le hicieron la ltima prueba de deteccin y tiene un riesgo mayor de Best boy clamidia o Radio broadcast assistant. Pregntele al mdico si usted tiene riesgo.  Pregntele al mdico si usted tiene un alto riesgo de Museum/gallery curator VIH. El mdico tambin puede recomendarle un medicamento recetado para ayudar a evitar la infeccin por el VIH. Si elige tomar medicamentos para prevenir el VIH, primero debe [employer] de deteccin del VIH. Luego debe hacerse anlisis cada 6meses mientras est tomando los medicamentos. Embarazo  Si est por dejar de Librarian, academic (fase premenopusica) y usted puede quedar [place], busque asesoramiento antes de [place].  Tome de 400 a [id number] (mcg) de cido [employer] si [place].  Pida mtodos de control de la natalidad (anticonceptivos) si desea evitar un embarazo no deseado. Osteoporosis y [place] La osteoporosis es una enfermedad en la que los huesos pierden los minerales y la fuerza por el avance de la edad. El resultado pueden ser fracturas en los [place]. Si tiene 65aos o ms, o si est en riesgo de sufrir osteoporosis y fracturas, pregunte a su mdico si debe:  Hacerse pruebas de deteccin de prdida sea.  Tomar un suplemento de calcio o de vitamina D para reducir el riesgo de fracturas.  Recibir terapia de reemplazo hormonal (TRH) para tratar los sntomas de la menopausia. Siga estas instrucciones en su casa: Estilo de vida  No consuma ningn producto que contenga nicotina o tabaco, como cigarrillos, cigarrillos electrnicos y tabaco de Higher education careers adviser. Si necesita ayuda para dejar de fumar, consulte al mdico.  No consuma drogas.  No comparta agujas.  Solicite ayuda a su mdico si necesita apoyo o informacin para abandonar las drogas. Consumo de alcohol  No beba alcohol si: ? Su mdico le indica no hacerlo. ? Est embarazada, puede estar embarazada o est tratando de quedar  embarazada.  Si bebe alcohol: ? Limite la cantidad que consume de 0 a 1 medida por da. ? Limite la ingesta si est amamantando.  Est atento a la cantidad de alcohol que hay en las bebidas que toma. En los [place], una medida equivale a una botella de cerveza de 12oz (362ml), un vaso de vino de 5oz (175ml) o un vaso de una bebida alcohlica de alta graduacin de 1oz (56ml). Instrucciones generales  Realcese los estudios de rutina de la salud, dentales y de Public librarian.  [place].  Infrmele a su mdico si: ? Se siente deprimida con frecuencia. ? Alguna vez ha sido vctima de [place] o no se siente segura en su casa. Resumen  Adoptar un estilo de vida saludable y recibir atencin preventiva son importantes para promover la salud y Musician.  Siga las instrucciones del mdico acerca de una dieta saludable, el ejercicio y la realizacin de pruebas o exmenes para Engineer, building services.  Siga las instrucciones del mdico con respecto al control del colesterol y la presin arterial. Esta informacin no tiene Marine scientist el consejo del mdico. Asegrese de hacerle al mdico cualquier pregunta que tenga. Document Revised: 06/22/2018 Document Reviewed: 06/22/2018 Elsevier Patient Education  [place].

## 2020-10-18 ENCOUNTER — Ambulatory Visit: Payer: 59 | Attending: Family Medicine

## 2020-10-18 ENCOUNTER — Other Ambulatory Visit: Payer: Self-pay

## 2020-10-18 DIAGNOSIS — E1149 Type 2 diabetes mellitus with other diabetic neurological complication: Secondary | ICD-10-CM

## 2020-10-19 LAB — CMP14+EGFR
ALT: 29 IU/L (ref 0–32)
AST: 32 IU/L (ref 0–40)
Albumin/Globulin Ratio: 2.4 — ABNORMAL HIGH (ref 1.2–2.2)
Albumin: 5 g/dL — ABNORMAL HIGH (ref 3.8–4.9)
Alkaline Phosphatase: 51 IU/L (ref 44–121)
BUN/Creatinine Ratio: 34 — ABNORMAL HIGH (ref 9–23)
BUN: 22 mg/dL (ref 6–24)
Bilirubin Total: 0.5 mg/dL (ref 0.0–1.2)
CO2: 24 mmol/L (ref 20–29)
Calcium: 9.9 mg/dL (ref 8.7–10.2)
Chloride: 98 mmol/L (ref 96–106)
Creatinine, Ser: 0.64 mg/dL (ref 0.57–1.00)
Globulin, Total: 2.1 g/dL (ref 1.5–4.5)
Glucose: 105 mg/dL — ABNORMAL HIGH (ref 65–99)
Potassium: 4 mmol/L (ref 3.5–5.2)
Sodium: 141 mmol/L (ref 134–144)
Total Protein: 7.1 g/dL (ref 6.0–8.5)
eGFR: 102 mL/min/{1.73_m2} (ref >59)

## 2020-10-19 LAB — LIPID PANEL
Chol/HDL Ratio: 3 ratio (ref 0.0–4.4)
Cholesterol, Total: 151 mg/dL (ref 100–199)
HDL: 51 mg/dL (ref >39)
LDL Chol Calc (NIH): 80 mg/dL (ref 0–99)
Triglycerides: 110 mg/dL (ref 0–149)
VLDL Cholesterol Cal: 20 mg/dL (ref 5–40)

## 2020-10-19 LAB — HEMOGLOBIN A1C
Est. average glucose Bld gHb Est-mCnc: 148 mg/dL
Hgb A1c MFr Bld: 6.8 % — ABNORMAL HIGH (ref 4.8–5.6)

## 2020-10-19 LAB — MICROALBUMIN / CREATININE URINE RATIO
Creatinine, Urine: 144.4 mg/dL
Microalb/Creat Ratio: 9 mg/g creat (ref 0–29)
Microalbumin, Urine: 13.5 ug/mL

## 2021-01-21 ENCOUNTER — Other Ambulatory Visit: Payer: Self-pay | Admitting: Family Medicine

## 2021-01-21 NOTE — Telephone Encounter (Signed)
Requested medications are due for refill today.  Yes  Requested medications are on the active medications list.  yes  Last refill. 09/21/2019  Future visit scheduled.   yes  Notes to clinic.  Prescription is expired.

## 2021-02-24 ENCOUNTER — Other Ambulatory Visit: Payer: Self-pay

## 2021-02-24 ENCOUNTER — Encounter: Payer: Self-pay | Admitting: Nurse Practitioner

## 2021-02-24 ENCOUNTER — Ambulatory Visit
Admission: EM | Admit: 2021-02-24 | Discharge: 2021-02-24 | Disposition: A | Discharge status: Home / Self Care | Payer: 59

## 2021-02-24 ENCOUNTER — Ambulatory Visit: Payer: 59 | Admitting: Nurse Practitioner

## 2021-02-24 VITALS — BP 144/89 | HR 99 | Temp 97.9°F | Resp 18 | Ht <= 58 in | Wt 141.8 lb

## 2021-02-24 DIAGNOSIS — K219 Gastro-esophageal reflux disease without esophagitis: Secondary | ICD-10-CM | POA: Diagnosis not present

## 2021-02-24 MED ORDER — PANTOPRAZOLE SODIUM 40 MG PO TBEC: 40.0000 mg | DELAYED_RELEASE_TABLET | Freq: Every day | ORAL | 3 refills | Status: DC

## 2021-02-24 NOTE — Assessment & Plan Note (Signed)
GERD:  Will order Protonix  May stop Prilosec  GERD diet  Stay well hydrated  Will place referral to GI   Follow up:  Follow up as scheduled

## 2021-02-24 NOTE — Patient Instructions (Signed)
GERD:  Will order Protonix  May stop Prilosec  GERD diet  Stay well hydrated  Will place referral to GI   Follow up:  Follow up as scheduled  Opciones de alimentos para pacientes adultos con enfermedad de reflujo gastroesofgico Food Choices for Gastroesophageal Reflux Disease, Adult Si tiene enfermedad de reflujo gastroesofgico (ERGE), los alimentos que consume y los hbitos de alimentacin son muy importantes. Elegir los alimentos adecuados puede ayudar a [employer]. Piense en consultar a un experto en alimentacin (nutricionista) para que lo ayude a Warehouse manager. Consejos para seguir Photographer las etiquetas de los alimentos Elija alimentos que tengan bajo contenido de grasas saturadas. Los alimentos que pueden ayudar con los sntomas incluyen los siguientes: Alimentos que tienen menos del 5 % de los valores diarios (VD) de grasa. Alimentos que tienen 0 gramos de grasas trans. Al cocinar No frer los alimentos. Cocinar la comida al horno, al vapor, a la plancha o en la parrilla. Estos son mtodos que no necesitan mucha grasa para Audiological scientist. Para agregar sabor, trate de consumir hierbas con bajo contenido de picante y [place]. Planificacin de las comidas  Elegir alimentos saludables con bajo contenido de [place], por ejemplo: Frutas y vegetales. Cereales integrales. Productos lcteos con bajo contenido de [place]. Lysbeth Galas, pescado y aves. Haga comidas pequeas frecuentes en lugar de 3 comidas abundantes al da. Coma lentamente, en un lugar donde est relajado. Evite agacharse o recostarse hasta 2 o 3 horas despus de haber comido. Limite los alimentos con alto contenido graso como las carnes grasas o los alimentos fritos. Limite su ingesta de alimentos grasos, como aceites, [place] y [place]. Evite lo siguiente si el mdico se lo indica: Consumir alimentos que le ocasionen sntomas. Pueden ser distintos para cada persona. Lleve un registro de los  alimentos para identificar aquellos que le causen sntomas. Alcohol. Beber mucha cantidad de lquido con las comidas. [place]er 2 o 3 horas antes de acostarse. Estilo de vida Mantenga un peso saludable. Pregntele a su mdico cul es el peso saludable para usted. Si necesita perder peso, hable con su mdico para hacerlo de manera segura. Haga actividad fsica durante, al menos, 30 minutos 5 das por semana o ms, o como se lo haya indicado el mdico. Use ropa suelta. No fume ni consuma ningn producto que contenga nicotina o tabaco. Si necesita ayuda para dejar de fumar, consulte al mdico. Duerma con la cabecera de la cama ms elevada que los pies. Use una cua debajo del colchn o bloques debajo del armazn de la cama para Theatre manager la cabecera de la cama elevada. Mastique chicle sin azcar despus de las comidas. Qu alimentos debo comer? Siga una dieta saludable y bien equilibrada que incluya abundantes frutas, verduras, cereales integrales, productos lcteos descremados, carnes magras, pescado y aves. Cada persona es diferente. Los alimentos que pueden causar sntomas en una persona pueden no causar sntomas en otra persona. Trabaje con el mdico para hallar alimentos que sean seguros para usted. Es posible que los productos que se enumeran ms [place] no constituyan una lista completa de lo que puede comer y Electronics engineer. Pngase en contacto con un experto en alimentos para conocer ms opciones. Qu alimentos debo evitar? Limitar algunos de estos alimentos puede ayudar a Chief Technology Officer los sntomas de [place]. Cada persona es diferente. Consulte a un experto en alimentacin o a su mdico para que lo ayude a Pension scheme manager los alimentos exactos que debe evitar, si los hubiere. Frutas Cualquier fruta que est preparada con  grasa agregada. Cualquier fruta que le ocasione sntomas. Para algunas personas, estas pueden incluir, las frutas ctricas como naranja, pomelo, pia y limn. Verduras Verduras fritas en abundante  aceite. Papas fritas. Cualquier verdura que est preparada con grasa agregada. Cualquier verdura que le ocasione sntomas. Para algunas personas, estas pueden incluir tomates y productos con tomate, [place], cebollas y [place], y rbanos picantes. Granos Pasteles o panes sin levadura con grasa agregada. Carnes y otras protenas Carnes de alto contenido graso como carne grasa de vaca o cerdo, salchichas, costillas, jamn, salchicha, salame y tocino. Carnes o protenas fritas, lo que incluye pescado frito y pollo frito. Frutos secos y Engineer, mining de frutos secos, en grandes cantidades. Lcteos Leche entera y [place] con chocolate. Phoebe Sharps. Crema. Helado. Queso crema. Batidos con [employer]. Grasas y [employer]. Margarina. Lardo. Mantequilla clarificada. Bebidas Caf y t negro, con o sin cafena. Bebidas con gas. Refrescos. Bebidas energizantes. Jugo de fruta hecho con frutas cidas, como naranja o pomelo. Jugo de tomate. Bebidas alcohlicas. Dulces y postres Chocolate y cacao. Rosquillas. Alios y condimentos Pimienta. Menta y mentol. Sal agregada. Cualquier condimento, hierbas o aderezos que le ocasionen sntomas. Para algunas personas, esto puede incluir curry, salsa picante o aderezos para ensalada a base de vinagre. Es posible que los productos que se enumeran ms arriba no constituyan una lista completa de lo que no debera comer y Electronics engineer. Pngase en contacto con un experto en alimentos para conocer ms opciones. Preguntas para hacerle al [employer] Los cambios en la dieta y en el estilo de vida a menudo son los primeros pasos que se toman para Air cabin crew los sntomas de [place]. Si los [employer] dieta y el estilo de vida no ayudan, hable con el mdico sobre el uso de medicamentos. Dnde buscar ms informacin Control and instrumentation engineer for Gastrointestinal Disorders (Fundacin Internacional para los Trastornos Gastrointestinales): aboutgerd.org Resumen Si tiene [employer], las elecciones de alimentos y  el [place] de vida son muy importantes para ayudar a Herbalist sntomas. Haga comidas pequeas durante Psychiatrist de 3 comidas abundantes. Coma lentamente y en un lugar donde est relajado. Evite agacharse o recostarse hasta 2 o 3 horas despus de haber comido. Limite los alimentos con alto contenido graso como las carnes grasas o los alimentos fritos. Esta informacin no tiene Marine scientist el consejo del mdico. Asegrese de hacerle al mdico cualquier pregunta que tenga. Document Revised: 01/12/2020 Document Reviewed: 01/12/2020 Elsevier Patient Education  [place].

## 2021-02-24 NOTE — Progress Notes (Signed)
'@Patient'  ID: Laura Douglas, female    DOB: 03-Mar-1961, 60 y.o.   MRN: [id number]  Chief Complaint  Patient presents with   GI Problem    Referring provider: Charlott Rakes, MD   HPI  Patient presents today for GI issues.  Spanish interpreter was used for this visit.  Patient states for the past year she has been having issues with reflux.  She states that her symptoms are worsening.  She does take Prilosec daily.  She states that this is not helping.  She states that she is having regular bowel movements.  She denies any significant abdominal pain. Denies f/c/s, n/v/d, hemoptysis, PND, chest pain or edema.      No Known Allergies  Immunization History  Administered Date(s) Administered   Influenza Inj Mdck Quad Pf 05/19/2018   Influenza,inj,Quad PF,6+ Mos 09/12/2014, 06/03/2015, 04/25/2020   PFIZER(Purple Top)SARS-COV-2 Vaccination 08/26/2019, 09/16/2019   Pneumococcal Polysaccharide-23 12/29/2018   Tdap 01/06/2018    Past Medical History:  Diagnosis Date   Colon cancer [facility])    possible? Patient had surgery in [place]. Denies needing adjuvant therapy. The surgery "removed a tumor from the colon".   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   atorvastatin (LIPITOR) 40 MG tablet TAKE 1 TABLET BY MOUTH ONCE DAILY WITH EVENING MEAL   Blood Glucose Monitoring Suppl (AGAMATRIX PRESTO) w/Device KIT Check sugars before meals twice sugars   canagliflozin (INVOKANA) 100 MG TABS tablet  Take 1 tablet (100 mg total) by mouth daily before breakfast.   gabapentin (NEURONTIN) 300 MG capsule Take 1 capsule (300 mg total) by mouth 2 (two) times daily.   glipiZIDE (GLUCOTROL) 5 MG tablet 1 tab by mouth twice daily with meals   glucose blood (AGAMATRIX PRESTO TEST) test strip Check sugars twice daily before meals   hydrochlorothiazide (HYDRODIURIL) 25 MG tablet Take 1 tablet (25 mg total) by mouth daily.   lisinopril (ZESTRIL) 5 MG tablet 1 tab by mouth daily with HCTZ   metFORMIN (GLUCOPHAGE) 1000 MG tablet TAKE 1 TABLET BY MOUTH TWICE DAILY WITH A MEAL   Simethicone 80 MG TABS Take 1 tablet (80 mg total) by mouth in the morning, at noon, and at bedtime.   [DISCONTINUED] omeprazole (PRILOSEC) 40 MG capsule Take 1 capsule by mouth once daily   No facility-administered encounter medications on file as of 02/24/2021.     Review of Systems  Review of Systems  Constitutional: Negative.   HENT: Negative.    Respiratory:  Negative for cough and shortness of breath.   Cardiovascular: Negative.   Gastrointestinal: Negative.  Negative for abdominal distention, abdominal pain and nausea.  Allergic/Immunologic: Negative.   Neurological: Negative.   Psychiatric/Behavioral: Negative.        Physical Exam  BP (!) 144/89 (BP Location: Left Arm, Patient Position: Sitting, Cuff Size: Normal)   Pulse 99   Temp 97.9 F (36.6 C)   Resp 18  Ht 4' 9.99" (1.473 m)   Wt 141 lb 12.8 oz (64.3 kg)   LMP  (LMP Unknown)   SpO2 98%   BMI 29.64 kg/m   Wt Readings from Last 5 Encounters:  02/24/21 141 lb 12.8 oz (64.3 kg)  10/14/20 142 lb (64.4 kg)  04/25/20 148 lb 12.8 oz (67.5 kg)  04/03/20 149 lb 4.8 oz (67.7 kg)  01/24/20 156 lb (70.8 kg)     Physical Exam Vitals and nursing note reviewed.  Constitutional:      General: She is not in acute distress.    Appearance: She is well-developed.  Cardiovascular:     Rate and Rhythm: Normal rate and regular rhythm.  Pulmonary:      Effort: Pulmonary effort is normal.     Breath sounds: Normal breath sounds.  Abdominal:     General: Abdomen is flat. Bowel sounds are normal. There is no distension.     Palpations: Abdomen is soft. There is no mass.     Tenderness: There is no abdominal tenderness. There is no right CVA tenderness or left CVA tenderness.  Neurological:     Mental Status: She is alert and oriented to person, place, and time.      Assessment & Plan:   GERD (gastroesophageal reflux disease) GERD:  Will order Protonix  May stop Prilosec  GERD diet  Stay well hydrated  Will place referral to GI   Follow up:  Follow up as scheduled   Patient Instructions  GERD:  Will order Protonix  May stop Prilosec  GERD diet  Stay well hydrated  Will place referral to GI   Follow up:  Follow up as scheduled  Opciones de alimentos para pacientes adultos con enfermedad de reflujo gastroesofgico Food Choices for Gastroesophageal Reflux Disease, Adult Si tiene enfermedad de reflujo gastroesofgico (ERGE), los alimentos que consume y los hbitos de alimentacin son muy importantes. Elegir los alimentos adecuados puede ayudar a [employer]. Piense en consultar a un experto en alimentacin (nutricionista) para que lo ayude a Warehouse manager. Consejos para seguir Photographer las etiquetas de los alimentos Elija alimentos que tengan bajo contenido de grasas saturadas. Los alimentos que pueden ayudar con los sntomas incluyen los siguientes: Alimentos que tienen menos del 5 % de los valores diarios (VD) de grasa. Alimentos que tienen 0 gramos de grasas trans. Al cocinar No frer los alimentos. Cocinar la comida al horno, al vapor, a la plancha o en la parrilla. Estos son mtodos que no necesitan mucha grasa para Audiological scientist. Para agregar sabor, trate de consumir hierbas con bajo contenido de picante y [place]. Planificacin de las comidas  Elegir alimentos saludables con bajo  contenido de [place], por ejemplo: Frutas y vegetales. Cereales integrales. Productos lcteos con bajo contenido de [place]. Lysbeth Galas, pescado y aves. Haga comidas pequeas frecuentes en lugar de 3 comidas abundantes al da. Coma lentamente, en un lugar donde est relajado. Evite agacharse o recostarse hasta 2 o 3 horas despus de haber comido. Limite los alimentos con alto contenido graso como las carnes grasas o los alimentos fritos. Limite su ingesta de alimentos grasos, como aceites, [place] y [place]. Evite lo siguiente si el mdico se lo indica: Consumir alimentos que le ocasionen sntomas. Pueden ser distintos para cada persona. Lleve un registro de los alimentos para identificar aquellos que le causen sntomas. Alcohol. Beber mucha cantidad de lquido con las comidas. [place] 2 o 3 horas antes de acostarse. Estilo de vida Mantenga un peso  saludable. Pregntele a su mdico cul es el peso saludable para usted. Si necesita perder peso, hable con su mdico para hacerlo de manera segura. Haga actividad fsica durante, al menos, 30 minutos 5 das por semana o ms, o como se lo haya indicado el mdico. Use ropa suelta. No fume ni consuma ningn producto que contenga nicotina o tabaco. Si necesita ayuda para dejar de fumar, consulte al mdico. Duerma con la cabecera de la cama ms elevada que los pies. Use una cua debajo del colchn o bloques debajo del armazn de la cama para Theatre manager la cabecera de la cama elevada. Mastique chicle sin azcar despus de las comidas. Qu alimentos debo comer? Siga una dieta saludable y bien equilibrada que incluya abundantes frutas, verduras, cereales integrales, productos lcteos descremados, carnes magras, pescado y aves. Cada persona es diferente. Los alimentos que pueden causar sntomas en una persona pueden no causar sntomas en otra persona. Trabaje con el mdico para hallar alimentos que sean seguros para usted. Es posible que los productos que se  enumeran ms [place] no constituyan una lista completa de lo que puede comer y Electronics engineer. Pngase en contacto con un experto en alimentos para conocer ms opciones. Qu alimentos debo evitar? Limitar algunos de estos alimentos puede ayudar a Chief Technology Officer los sntomas de [place]. Cada persona es diferente. Consulte a un experto en alimentacin o a su mdico para que lo ayude a Pension scheme manager los alimentos exactos que debe evitar, si los hubiere. Frutas Cualquier fruta que est preparada con grasa agregada. Cualquier fruta que le ocasione sntomas. Para algunas personas, estas pueden incluir, las frutas ctricas como naranja, pomelo, pia y limn. Verduras Verduras fritas en abundante aceite. Papas fritas. Cualquier verdura que est preparada con grasa agregada. Cualquier verdura que le ocasione sntomas. Para algunas personas, estas pueden incluir tomates y productos con tomate, [place], cebollas y [place], y rbanos picantes. Granos Pasteles o panes sin levadura con grasa agregada. Carnes y otras protenas Carnes de alto contenido graso como carne grasa de vaca o cerdo, salchichas, costillas, jamn, salchicha, salame y tocino. Carnes o protenas fritas, lo que incluye pescado frito y pollo frito. Frutos secos y Engineer, mining de frutos secos, en grandes cantidades. Lcteos Leche entera y [place] con chocolate. Phoebe Sharps. Crema. Helado. Queso crema. Batidos con [employer]. Grasas y [employer]. Margarina. Lardo. [place] clarificada. Bebidas Caf y t negro, con o sin cafena. Bebidas con gas. Refrescos. Bebidas energizantes. Jugo de fruta hecho con frutas cidas, como naranja o pomelo. Jugo de tomate. Bebidas alcohlicas. Dulces y postres Chocolate y cacao. Rosquillas. Alios y condimentos Pimienta. Menta y mentol. Sal agregada. Cualquier condimento, hierbas o aderezos que le ocasionen sntomas. Para algunas personas, esto puede incluir curry, salsa picante o aderezos para ensalada a base de vinagre. Es posible  que los productos que se enumeran ms [place]rib[place] no constituyan una lista completa de lo que no debera comer y Electronics engineer. Pngase en contacto con un experto en alimentos para conocer ms opciones. Preguntas para hacerle al [employer] Los cambios en la dieta y en el estilo de vida a menudo son los primeros pasos que se toman para Air cabin crew los sntomas de [place]. Si los [employer] dieta y el estilo de vida no ayudan, hable con el mdico sobre el uso de medicamentos. Dnde buscar ms informacin Control and instrumentation engineer for Gastrointestinal Disorders (Fundacin Internacional para los Trastornos Gastrointestinales): aboutgerd.org Resumen Si tiene [employer], las elecciones de alimentos y el [place] de vida son muy importantes para ayudar a Herbalist sntomas.  Haga comidas pequeas durante Psychiatrist de 3 comidas abundantes. Coma lentamente y en un lugar donde est relajado. Evite agacharse o recostarse hasta 2 o 3 horas despus de haber comido. Limite los alimentos con alto contenido graso como las carnes grasas o los alimentos fritos. Esta informacin no tiene Marine scientist el consejo del mdico. Asegrese de hacerle al mdico cualquier pregunta que tenga. Document Revised: 01/12/2020 Document Reviewed: 01/12/2020 Elsevier Patient Education  2022 [place], [place] 02/24/2021

## 2021-02-24 NOTE — Progress Notes (Signed)
Pt presents for GI issues, reports she has been experiencing symptoms of gas, nausea and bloating for approx 1 year w/mild pain, symptoms present mostly in the morning

## 2021-04-21 ENCOUNTER — Ambulatory Visit: Payer: 59 | Admitting: Family Medicine

## 2021-04-22 ENCOUNTER — Ambulatory Visit: Payer: 59 | Attending: Family Medicine | Admitting: Family Medicine

## 2021-04-22 ENCOUNTER — Encounter: Payer: Self-pay | Admitting: Family Medicine

## 2021-04-22 ENCOUNTER — Other Ambulatory Visit: Payer: Self-pay

## 2021-04-22 VITALS — BP 139/85 | HR 88 | Ht 62.0 in | Wt 154.4 lb

## 2021-04-22 DIAGNOSIS — E1169 Type 2 diabetes mellitus with other specified complication: Secondary | ICD-10-CM | POA: Diagnosis not present

## 2021-04-22 DIAGNOSIS — C541 Malignant neoplasm of endometrium: Secondary | ICD-10-CM

## 2021-04-22 DIAGNOSIS — Z23 Encounter for immunization: Secondary | ICD-10-CM

## 2021-04-22 DIAGNOSIS — Z1509 Genetic susceptibility to other malignant neoplasm: Secondary | ICD-10-CM

## 2021-04-22 DIAGNOSIS — E1149 Type 2 diabetes mellitus with other diabetic neurological complication: Secondary | ICD-10-CM | POA: Diagnosis not present

## 2021-04-22 DIAGNOSIS — I152 Hypertension secondary to endocrine disorders: Secondary | ICD-10-CM

## 2021-04-22 DIAGNOSIS — K2950 Unspecified chronic gastritis without bleeding: Secondary | ICD-10-CM

## 2021-04-22 DIAGNOSIS — E785 Hyperlipidemia, unspecified: Secondary | ICD-10-CM

## 2021-04-22 DIAGNOSIS — E1159 Type 2 diabetes mellitus with other circulatory complications: Secondary | ICD-10-CM | POA: Diagnosis not present

## 2021-04-22 LAB — POCT GLYCOSYLATED HEMOGLOBIN (HGB A1C): HbA1c, POC (controlled diabetic range): 8.6 % — AB (ref 0.0–7.0)

## 2021-04-22 LAB — GLUCOSE, POCT (MANUAL RESULT ENTRY): POC Glucose: 138 mg/dl — AB (ref 70–99)

## 2021-04-22 MED ORDER — ATORVASTATIN CALCIUM 40 MG PO TABS: ORAL_TABLET | ORAL | 6 refills | Status: DC

## 2021-04-22 MED ORDER — GABAPENTIN 300 MG PO CAPS: 300.0000 mg | ORAL_CAPSULE | Freq: Two times a day (BID) | ORAL | 6 refills | Status: DC

## 2021-04-22 MED ORDER — DAPAGLIFLOZIN PROPANEDIOL 10 MG PO TABS: 10.0000 mg | ORAL_TABLET | Freq: Every day | ORAL | 6 refills | Status: DC

## 2021-04-22 MED ORDER — HYDROCHLOROTHIAZIDE 25 MG PO TABS: 25.0000 mg | ORAL_TABLET | Freq: Every day | ORAL | 6 refills | Status: DC

## 2021-04-22 MED ORDER — METFORMIN HCL 1000 MG PO TABS: ORAL_TABLET | ORAL | 6 refills | Status: DC

## 2021-04-22 MED ORDER — LISINOPRIL 5 MG PO TABS: ORAL_TABLET | ORAL | 6 refills | Status: DC

## 2021-04-22 MED ORDER — GLIPIZIDE 5 MG PO TABS: ORAL_TABLET | ORAL | 6 refills | Status: DC

## 2021-04-22 NOTE — Patient Instructions (Signed)
Gastritis, Adult Gastritis is irritation and swelling (inflammation) of the stomach. There are two kinds of gastritis: Acute gastritis. This kind develops quickly. Chronic gastritis. This kind is much more common. It develops slowly and lasts for a long time. It is important to get help for this condition. If you do not get help, your stomach can bleed, and you can get sores (ulcers) in your stomach. What are the causes? This condition may be caused by: Germs that get to your stomach and cause an infection. Drinking too much alcohol. Medicines you are taking. Having too much acid in the stomach. Having a disease of the stomach. Other causes may include: An allergic reaction. Some cancer treatments (radiation). Smoking cigarettes or using products that contain nicotine or tobacco. In some cases, the cause of this condition is not known. What increases the risk? Having a disease of the intestines. Having Crohn's disease. Using aspirin or ibuprofen and other NSAIDs to treat other conditions. Stress. What are the signs or symptoms? Pain in your stomach. A burning feeling in your stomach. Feeling like you may vomit (nauseous). Vomiting or vomiting blood. Feeling too full after you eat. Weight loss. Bad breath. Blood in your poop (stool). In some cases, there are no symptoms. How is this treated? This condition is treated with medicines. The medicines that are used depend on what caused the condition. You may be given: Antibiotic medicine, if your condition was caused by an infection from germs. H2 blockers and similar medicines, if your condition was caused by too much acid in the stomach. Treatment may also include stopping the use of certain medicines, such as aspirin or ibuprofen. Follow these instructions at home: Medicines Take over-the-counter and prescription medicines only as told by your doctor. If you were prescribed an antibiotic medicine, take it as told by your doctor.  Do not stop taking it even if you start to feel better. Alcohol use Do not drink alcohol if: Your doctor tells you not to drink. You are pregnant, may be pregnant, or are planning to become pregnant. If you drink alcohol: Limit your use to: 0-1 drink a day for women. 0-2 drinks a day for men. Know how much alcohol is in your drink. In the [place]., one drink equals one 12 oz bottle of beer (355 mL), one 5 oz glass of wine (148 mL), or one 1 oz glass of hard liquor (44 mL). General instructions  Eat small meals often, instead of large meals. Avoid foods and drinks that make you feel worse. Drink enough fluid to keep your pee (urine) pale yellow. Talk with your doctor about ways to manage stress. You can exercise or do deep breathing, meditation, or yoga. Do not smoke or use any products that contain nicotine or tobacco. If you need help quitting, ask your doctor. Keep all follow-up visits. Contact a doctor if: Your symptoms get worse. Your stomach pain gets worse. Your symptoms go away and then come back. You have a fever. Get help right away if: You vomit blood or something that looks like coffee grounds. You have black or dark red poop. You throw up any time you try to drink fluids. These symptoms may be an emergency. Get help right away. Call your local emergency services (911 in the [place] Do not wait to see if the symptoms will go away. Do not drive yourself to the hospital. Summary Gastritis is irritation and swelling (inflammation) of the stomach. You must get help for this condition. If you do   not get help, your stomach can bleed, and you can get sores (ulcers) in your stomach. You can be treated with medicines for germs or medicines to block too much acid in your stomach. This information is not intended to replace advice given to you by your health care provider. Make sure you discuss any questions you have with your health care provider. Document Revised: 10/05/2020 Document  Reviewed: 10/05/2020 Elsevier Patient Education  [place].

## 2021-04-22 NOTE — Progress Notes (Signed)
Nausea and abdominal discomfort in the morning

## 2021-04-22 NOTE — Assessment & Plan Note (Signed)
Uncontrolled with A1c of 8.6; goal is less than 7.0  Switched from [place] to [place] Consider addition of GLP-1 RA at next visit if still above goal Counseled on Diabetic diet, my plate method, 060 minutes of moderate intensity exercise/week Blood sugar logs with fasting goals of 80-120 mg/dl, random of less than 180 and in the event of sugars less than 60 mg/dl or greater than 400 mg/dl encouraged to notify the clinic. Advised on the need for annual eye exams, annual foot exams, Pneumonia vaccine.

## 2021-04-22 NOTE — Assessment & Plan Note (Signed)
Status post TAH/BSO and LND Has not been seen by GYN oncology recently Will place referral to GYN oncology, CT abdomen ordered

## 2021-04-22 NOTE — Assessment & Plan Note (Signed)
Slightly above goal No regimen change stable encouraged to work on lifestyle modifications Counseled on blood pressure goal of less than 130/80, low-sodium, DASH diet, medication compliance, 150 minutes of moderate intensity exercise per week. Discussed medication compliance, adverse effects.

## 2021-04-22 NOTE — Progress Notes (Signed)
Subjective:  Patient ID: Laura Douglas, female    DOB: 10/30/1960  Age: 60 y.o. MRN: [id number]  CC: Diabetes   HPI Laura Douglas is a 60 y.o. year old female with a history of Lynch syndrome, colon cancer in 2009 (status post resection), endometrial cancer (status post TAHBSO LND in 08/2014 with pathology revealing stage Ia grade 1 endometrioid endometrial cancer with no lymphovascular space invasion and negative lymph nodes) type 2 diabetes mellitus (A1c 8.6)  Interval History: She complains of epigastric pain worse in the morning which wakes her up. She goes on to say it is not pain 'but it bothers me a lot'. She has nausea but does not vomit. It occurs for a few minutes then resolves. Prescribed a PPI at her visit with the PA 2 months which has been ineffective. She has not been to see her GI since 03/2020.  I placed a referral to GI about 6 months ago and another referral was placed again 2 months ago by the physician assistant but she is yet to be seen. She has also not had a recent visit with GYN oncology.  A1c is 8.6 up from 7.1 Blood sugars are 120-130 fasting.  She endorses compliance with her medications. She has gained 13 lbs in the last 2 months.  Does not exercise regularly. Denies presence of visual concerns and neuropathy is stable. Past Medical History:  Diagnosis Date   Colon cancer [facility])    possible? Patient had surgery in [place]. Denies needing adjuvant therapy. The surgery "removed a tumor from the colon".   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    Past Surgical History:  Procedure Laterality Date   ABDOMINAL HYSTERECTOMY     abortions     twice    arm surgery     left arm   COLON SURGERY     COLON SURGERY  2010   tumor removal    COLONOSCOPY  02/17/2018   Mansouraty ta x 2, hems   FRACTURE SURGERY  2006   Lt lower arm   MULTIPLE TOOTH EXTRACTIONS     ROBOTIC ASSISTED TOTAL HYSTERECTOMY WITH BILATERAL SALPINGO OOPHERECTOMY Bilateral 09/11/2014   Procedure: ROBOTIC ASSISTED TOTAL LAPAROSCOPIC HYSTERECTOMY WITH BILATERAL SALPINGO OOPHORECTOMY LYMPH NODE DISSECTION; LYSIS OF ADHESIONS;  Surgeon: Janie Morning, MD;  Location: [facility];  Service: Gynecology;  Laterality: Bilateral;   TUBAL LIGATION  1993   UPPER GASTROINTESTINAL ENDOSCOPY      Family History  Problem Relation Age of Onset   Prostate cancer Father    Diabetes Mother    Uterine cancer Sister 5   Cancer Sister        colon cancer as well age 75?   Colon cancer Sister    Uterine cancer Maternal Aunt    Alcohol abuse Brother    Cirrhosis Brother        cause of death   Breast cancer Neg Hx    Rectal cancer Neg Hx    Stomach cancer Neg Hx     No Known Allergies  Outpatient Medications Prior to Visit  Medication Sig Dispense Refill   Blood Glucose Monitoring Suppl (AGAMATRIX PRESTO) w/Device KIT Check sugars before meals twice sugars 1 kit  0   glucose blood (AGAMATRIX PRESTO TEST) test strip Check sugars twice daily before meals 100 each 12   pantoprazole (PROTONIX) 40 MG tablet Take 1 tablet (40 mg total) by mouth daily. 30 tablet 3   Simethicone 80 MG TABS Take 1 tablet (80 mg total) by mouth in the morning, at noon, and at bedtime. 60 tablet 2   atorvastatin (LIPITOR) 40 MG tablet TAKE 1 TABLET BY MOUTH ONCE DAILY WITH EVENING MEAL 30 tablet 6   canagliflozin (INVOKANA) 100 MG TABS tablet Take 1 tablet (100 mg total) by mouth daily before breakfast. 30 tablet 6   gabapentin (NEURONTIN) 300 MG capsule Take 1 capsule (300 mg total) by mouth 2 (two) times daily. 60 capsule 6   glipiZIDE (GLUCOTROL) 5 MG tablet 1 tab by mouth twice daily with meals 60 tablet 6   hydrochlorothiazide (HYDRODIURIL) 25 MG tablet Take 1 tablet (25 mg total) by mouth  daily. 30 tablet 6   lisinopril (ZESTRIL) 5 MG tablet 1 tab by mouth daily with HCTZ 30 tablet 6   metFORMIN (GLUCOPHAGE) 1000 MG tablet TAKE 1 TABLET BY MOUTH TWICE DAILY WITH A MEAL 60 tablet 6   No facility-administered medications prior to visit.     ROS Review of Systems  Constitutional:  Negative for activity change, appetite change and fatigue.  HENT:  Negative for congestion, sinus pressure and sore throat.   Eyes:  Negative for visual disturbance.  Respiratory:  Negative for cough, chest tightness, shortness of breath and wheezing.   Cardiovascular:  Negative for chest pain and palpitations.  Gastrointestinal:  Positive for abdominal pain. Negative for abdominal distention and constipation.  Endocrine: Negative for polydipsia.  Genitourinary:  Negative for dysuria and frequency.  Musculoskeletal:  Negative for arthralgias and back pain.  Skin:  Negative for rash.  Neurological:  Negative for tremors, light-headedness and numbness.  Hematological:  Does not bruise/bleed easily.  Psychiatric/Behavioral:  Negative for agitation and behavioral problems.    Objective:  BP 139/85   Pulse 88   Ht '5\' 2"'  (1.575 m)   Wt 154 lb 6.4 oz (70 kg)   LMP  (LMP Unknown)   SpO2 100%   BMI 28.24 kg/m   BP/Weight 04/22/2021 9/70/2637 01/17/8849  Systolic BP 277 412 878  Diastolic BP 85 89 69  Wt. (Lbs) 154.4 141.8 142  BMI 28.24 29.64 29.68      Physical Exam Constitutional:      Appearance: She is well-developed.  Cardiovascular:     Rate and Rhythm: Normal rate.     Heart sounds: Normal heart sounds. No murmur heard. Pulmonary:     Effort: Pulmonary effort is normal.     Breath sounds: Normal breath sounds. No wheezing or rales.  Chest:     Chest wall: No tenderness.  Abdominal:     General: Bowel sounds are normal. There is no distension.     Palpations: Abdomen is soft. There is no mass.     Tenderness: There is abdominal tenderness (epigastric).  Musculoskeletal:         General: Normal range of motion.     Right lower leg: No edema.     Left lower leg: No edema.  Neurological:     Mental Status: She is alert and oriented to person, place, and time.  Psychiatric:        Mood and Affect: Mood normal.    CMP Latest Ref Rng & Units 10/18/2020 04/25/2020 02/07/2020  Glucose 65 - 99  mg/dL 105(H) 172(H) 92  BUN 6 - 24 mg/dL '22 17 18  ' Creatinine 0.57 - 1.00 mg/dL 0.64 0.87 0.71  Sodium 134 - 144 mmol/L 141 139 144  Potassium 3.5 - 5.2 mmol/L 4.0 4.0 3.8  Chloride 96 - 106 mmol/L 98 98 103  CO2 20 - 29 mmol/L '24 25 24  ' Calcium 8.7 - 10.2 mg/dL 9.9 10.3(H) 9.8  Total Protein 6.0 - 8.5 g/dL 7.1 - 6.8  Total Bilirubin 0.0 - 1.2 mg/dL 0.5 - 0.4  Alkaline Phos 44 - 121 IU/L 51 - 48  AST 0 - 40 IU/L 32 - 20  ALT 0 - 32 IU/L 29 - 27    Lipid Panel     Component Value Date/Time   CHOL 151 10/18/2020 0957   TRIG 110 10/18/2020 0957   HDL 51 10/18/2020 0957   CHOLHDL 3.0 10/18/2020 0957   CHOLHDL 6.7 (H) 06/04/2015 0925   VLDL 67 (H) 06/04/2015 0925   LDLCALC 80 10/18/2020 0957    CBC    Component Value Date/Time   WBC 6.4 11/17/2016 0852   WBC 7.5 03/12/2016 1122   RBC 4.96 11/17/2016 0852   RBC 4.77 03/12/2016 1122   HGB 13.7 11/17/2016 0852   HCT 42.8 11/17/2016 0852   PLT 237 11/17/2016 0852   MCV 86 11/17/2016 0852   MCH 27.6 11/17/2016 0852   MCH 28.7 03/12/2016 1122   MCHC 32.0 11/17/2016 0852   MCHC 33.1 03/12/2016 1122   RDW 13.1 11/17/2016 0852   LYMPHSABS 2.7 11/17/2016 0852   MONOABS 0.4 03/12/2016 1122   EOSABS 0.2 11/17/2016 0852   BASOSABS 0.0 11/17/2016 5110    Lab Results  Component Value Date   HGBA1C 8.6 (A) 04/22/2021    Assessment & Plan:   Problem List Items Addressed This Visit       Cardiovascular and Mediastinum   Hypertension complicating diabetes ([place])    Slightly above goal No regimen change stable encouraged to work on lifestyle modifications Counseled on blood pressure goal of less than 130/80,  low-sodium, DASH diet, medication compliance, 150 minutes of moderate intensity exercise per week. Discussed medication compliance, adverse effects.       Relevant Medications   dapagliflozin propanediol (FARXIGA) 10 MG TABS tablet   atorvastatin (LIPITOR) 40 MG tablet   glipiZIDE (GLUCOTROL) 5 MG tablet   hydrochlorothiazide (HYDRODIURIL) 25 MG tablet   lisinopril (ZESTRIL) 5 MG tablet   metFORMIN (GLUCOPHAGE) 1000 MG tablet     Endocrine   Diabetes mellitus ([place]) - Primary    Uncontrolled with A1c of 8.6; goal is less than 7.0  Switched from [place] to [place] Consider addition of GLP-1 RA at next visit if still above goal Counseled on Diabetic diet, my plate method, 211 minutes of moderate intensity exercise/week Blood sugar logs with fasting goals of 80-120 mg/dl, random of less than 180 and in the event of sugars less than 60 mg/dl or greater than 400 mg/dl encouraged to notify the clinic. Advised on the need for annual eye exams, annual foot exams, Pneumonia vaccine.        Relevant Medications   dapagliflozin propanediol (FARXIGA) 10 MG TABS tablet   atorvastatin (LIPITOR) 40 MG tablet   gabapentin (NEURONTIN) 300 MG capsule   glipiZIDE (GLUCOTROL) 5 MG tablet   lisinopril (ZESTRIL) 5 MG tablet   metFORMIN (GLUCOPHAGE) 1000 MG tablet   Other Relevant Orders   POCT glucose (manual entry) (Completed)   POCT glycosylated hemoglobin (Hb A1C) (Completed)  Basic Metabolic Panel   Hyperlipidemia associated with type 2 diabetes mellitus ([place])    Controlled from 10/2020 Continue statin Low-cholesterol diet      Relevant Medications   dapagliflozin propanediol (FARXIGA) 10 MG TABS tablet   atorvastatin (LIPITOR) 40 MG tablet   glipiZIDE (GLUCOTROL) 5 MG tablet   hydrochlorothiazide (HYDRODIURIL) 25 MG tablet   lisinopril (ZESTRIL) 5 MG tablet   metFORMIN (GLUCOPHAGE) 1000 MG tablet     Genitourinary   MSH2-related endometrial cancer (H[facility])    Status post TAH/BSO and  LND Has not been seen by GYN oncology recently Will place referral to GYN oncology, CT abdomen ordered      Relevant Orders   CT Abdomen Pelvis W Contrast   Ambulatory referral to Gynecologic Oncology     Other   Lynch syndrome    Needs close follow-up with GI I have sent a message to the referral coordinator to expedite this referral as she is due for her 1 year surveillance colonoscopy      Relevant Orders   CEA   Other Visit Diagnoses     Other chronic gastritis without hemorrhage   Hold PPI for 2 weeks then we will check H. pylori breath test   Relevant Orders   H. pylori breath test   Need for immunization against influenza       Relevant Orders   Flu Vaccine QUAD 65moIM (Fluarix, Fluzone & Alfiuria Quad PF) (Completed)   Need for pneumococcal vaccine       Relevant Orders   Pneumococcal conjugate vaccine 20-valent (Completed)         Meds ordered this encounter  Medications   dapagliflozin propanediol (FARXIGA) 10 MG TABS tablet    Sig: Take 1 tablet (10 mg total) by mouth daily.    Dispense:  30 tablet    Refill:  6    Discontinue Invokana   atorvastatin (LIPITOR) 40 MG tablet    Sig: TAKE 1 TABLET BY MOUTH ONCE DAILY WITH EVENING MEAL    Dispense:  30 tablet    Refill:  6   gabapentin (NEURONTIN) 300 MG capsule    Sig: Take 1 capsule (300 mg total) by mouth 2 (two) times daily.    Dispense:  60 capsule    Refill:  6   glipiZIDE (GLUCOTROL) 5 MG tablet    Sig: 1 tab by mouth twice daily with meals    Dispense:  60 tablet    Refill:  6   hydrochlorothiazide (HYDRODIURIL) 25 MG tablet    Sig: Take 1 tablet (25 mg total) by mouth daily.    Dispense:  30 tablet    Refill:  6   lisinopril (ZESTRIL) 5 MG tablet    Sig: 1 tab by mouth daily with HCTZ    Dispense:  30 tablet    Refill:  6   metFORMIN (GLUCOPHAGE) 1000 MG tablet    Sig: TAKE 1 TABLET BY MOUTH TWICE DAILY WITH A MEAL    Dispense:  60 tablet    Refill:  6     Follow-up: Return in  about 3 months (around 07/23/2021) for Medical conditions.       ECharlott Rakes MD, FAAFP. CCon[facility] Center[facility] [facility]wisher  04/22/2021, 5:47 PM

## 2021-04-22 NOTE — Assessment & Plan Note (Signed)
Needs close follow-up with GI I have sent a message to the referral coordinator to expedite this referral as she is due for her 1 year surveillance colonoscopy

## 2021-04-22 NOTE — Assessment & Plan Note (Signed)
Controlled from 10/2020 Continue statin Low-cholesterol diet

## 2021-04-24 ENCOUNTER — Other Ambulatory Visit: Payer: Self-pay | Admitting: Family Medicine

## 2021-04-24 ENCOUNTER — Ambulatory Visit: Payer: 59 | Attending: Family Medicine

## 2021-04-24 ENCOUNTER — Other Ambulatory Visit: Payer: Self-pay

## 2021-04-24 ENCOUNTER — Telehealth: Payer: Self-pay | Admitting: *Deleted

## 2021-04-24 DIAGNOSIS — K2950 Unspecified chronic gastritis without bleeding: Secondary | ICD-10-CM

## 2021-04-24 DIAGNOSIS — E1149 Type 2 diabetes mellitus with other diabetic neurological complication: Secondary | ICD-10-CM

## 2021-04-24 DIAGNOSIS — C541 Malignant neoplasm of endometrium: Secondary | ICD-10-CM

## 2021-04-24 NOTE — Telephone Encounter (Signed)
Laura Douglas at Dr Margarita Rana office, explained "per Dr Berline Lopes the patient is far enough from the endo ca that she can be followed by a regular gyn office. Also the gyn office can follow the lynch syndrome. If the patient doesn't have a gyn Dr Berline Lopes recommends [facility] for [facility] at [phone]."

## 2021-04-25 LAB — BASIC METABOLIC PANEL
BUN/Creatinine Ratio: 22 (ref 9–23)
BUN: 16 mg/dL (ref 6–24)
CO2: 22 mmol/L (ref 20–29)
Calcium: 9.6 mg/dL (ref 8.7–10.2)
Chloride: 102 mmol/L (ref 96–106)
Creatinine, Ser: 0.72 mg/dL (ref 0.57–1.00)
Glucose: 119 mg/dL — ABNORMAL HIGH (ref 70–99)
Potassium: 4.3 mmol/L (ref 3.5–5.2)
Sodium: 142 mmol/L (ref 134–144)
eGFR: 97 mL/min/{1.73_m2} (ref >59)

## 2021-04-26 LAB — H. PYLORI BREATH TEST: H pylori Breath Test: NEGATIVE

## 2021-04-28 ENCOUNTER — Ambulatory Visit (HOSPITAL_BASED_OUTPATIENT_CLINIC_OR_DEPARTMENT_OTHER): Payer: 59

## 2021-04-30 ENCOUNTER — Telehealth: Payer: Self-pay | Admitting: Family Medicine

## 2021-04-30 NOTE — Telephone Encounter (Signed)
Copied from [place] [phone]. Topic: General - Other >> Apr 28, 2021 10:23 AM Tessa Lerner A wrote: Reason for CRM: The patient was instructed to pick up a prescription prior to their CT pelvic exam (contrast fluid)   The patient is uncertain of what they were prescribed and would like to discuss this further with a member of staff when available   Their pelvic exam is scheduled for Thursday 05/01/21  The patient will need the assistance of an interpreter

## 2021-05-01 ENCOUNTER — Encounter (HOSPITAL_BASED_OUTPATIENT_CLINIC_OR_DEPARTMENT_OTHER): Payer: Self-pay

## 2021-05-01 ENCOUNTER — Ambulatory Visit (HOSPITAL_BASED_OUTPATIENT_CLINIC_OR_DEPARTMENT_OTHER)
Admission: RE | Admit: 2021-05-01 | Discharge: 2021-05-01 | Disposition: A | Discharge status: Home / Self Care | Payer: 59 | Source: Ambulatory Visit | Attending: Family Medicine | Admitting: Family Medicine

## 2021-05-01 ENCOUNTER — Other Ambulatory Visit: Payer: Self-pay

## 2021-05-01 ENCOUNTER — Telehealth: Payer: Self-pay

## 2021-05-01 DIAGNOSIS — C541 Malignant neoplasm of endometrium: Secondary | ICD-10-CM | POA: Diagnosis present

## 2021-05-01 MED ORDER — IOHEXOL 350 MG/ML SOLN
60.0000 mL | Freq: Once | INTRAVENOUS | Status: AC | PRN
  Administered 2021-05-01: 08:00:00 60 mL via INTRAVENOUS

## 2021-05-01 MED ORDER — IOHEXOL 300 MG/ML  SOLN: 60.0000 mL | Freq: Once | INTRAMUSCULAR | Status: DC | PRN

## 2021-05-01 NOTE — Telephone Encounter (Signed)
-----   Message from Charlott Rakes, MD sent at 04/28/2021  8:10 AM EST ----- Please inform the patient that labs are normal. Thank you.

## 2021-05-01 NOTE — Telephone Encounter (Signed)
Patient name and DOB has been verified Patient was informed of lab results. Patient had no questions.  

## 2021-05-02 ENCOUNTER — Telehealth: Payer: Self-pay

## 2021-05-02 NOTE — Telephone Encounter (Signed)
Patient name and DOB has been verified Patient was informed of lab results. Patient had no questions.  

## 2021-05-02 NOTE — Telephone Encounter (Signed)
-----   Message from Charlott Rakes, MD sent at 05/02/2021 10:25 AM EST ----- Please inform her that her imaging does not reveal any evidence of cancer or recurrence but she does have severe fat deposits on her liver and in her arteries.  Please advised to continue with low-cholesterol medication, low-cholesterol diet and exercise.

## 2021-06-18 ENCOUNTER — Encounter: Payer: 59 | Admitting: Obstetrics and Gynecology

## 2021-09-14 ENCOUNTER — Other Ambulatory Visit: Payer: Self-pay | Admitting: Nurse Practitioner

## 2021-09-15 ENCOUNTER — Ambulatory Visit: Payer: Self-pay | Admitting: Family Medicine

## 2021-10-05 ENCOUNTER — Other Ambulatory Visit: Payer: Self-pay | Admitting: Nurse Practitioner

## 2021-10-28 ENCOUNTER — Telehealth: Payer: Self-pay | Admitting: Family Medicine

## 2021-10-28 DIAGNOSIS — Z1509 Genetic susceptibility to other malignant neoplasm: Secondary | ICD-10-CM

## 2021-10-28 DIAGNOSIS — R1032 Left lower quadrant pain: Secondary | ICD-10-CM

## 2021-10-28 NOTE — Telephone Encounter (Signed)
Copied from [place] [phone]. Topic: Referral - Request for Referral ?>> Oct 28, 2021 12:20 PM Tessa Lerner A wrote: ?Has patient seen PCP for this complaint? No. ?*If NO, is insurance requiring patient see PCP for this issue before PCP can refer them? ?Referral for which specialty: Gastroenterology  ?Preferred provider/office: Patient has no preference  ?Reason for referral: stomach discomfort ?

## 2021-10-30 NOTE — Telephone Encounter (Signed)
Will forward to provider  

## 2021-10-30 NOTE — Telephone Encounter (Signed)
Referral has been placed. 

## 2021-11-20 ENCOUNTER — Ambulatory Visit (HOSPITAL_COMMUNITY)
Admission: EM | Admit: 2021-11-20 | Discharge: 2021-11-20 | Disposition: A | Discharge status: Home / Self Care | Payer: Self-pay

## 2021-11-20 ENCOUNTER — Ambulatory Visit: Payer: Self-pay | Admitting: Physician Assistant

## 2021-11-20 ENCOUNTER — Other Ambulatory Visit: Payer: Self-pay | Admitting: Family Medicine

## 2021-11-20 ENCOUNTER — Encounter (HOSPITAL_COMMUNITY): Payer: Self-pay

## 2021-11-20 DIAGNOSIS — K219 Gastro-esophageal reflux disease without esophagitis: Secondary | ICD-10-CM

## 2021-11-20 DIAGNOSIS — E1149 Type 2 diabetes mellitus with other diabetic neurological complication: Secondary | ICD-10-CM

## 2021-11-20 NOTE — Discharge Instructions (Addendum)
Contine tomando su medicamento (pantoprazol)  Evite los alimentos picantes y Estate agent un seguimiento con su proveedor de atencin primaria  Vaya al departamento de emergencias si sus sntomas empeoran

## 2021-11-20 NOTE — ED Provider Notes (Signed)
[place]    CSN: [id number] Arrival date & time: 11/20/21  1059     History   Chief Complaint Gastritis   HPI Laura Douglas is a 61 y.o. female.  Interpreter used for this encounter.  Patient presents to the urgent care with request for upper endoscopy.  She has a history of gastritis and would like further imaging.  She has no pain in clinic today.  States last flareup was Sunday night after she ate chili.  She does have a primary care provider.  She takes Protonix daily.  Regular bowel movements. She denies fever, chills, chest pain, shortness of breath, abdominal pain, vomiting/diarrhea, hematochezia, dark stools, hematemesis, weakness. Not lightheaded or dizzy. No symptoms in clinic today.   When she does have symptoms, she feels a burning pain in her belly up to her throat that occurs when she lays flat after meals.   Past Medical History:  Diagnosis Date   Colon cancer [facility])    possible? Patient had surgery in [place]. Denies needing adjuvant therapy. The surgery "removed a tumor from the colon".   Constipation    occasional   Diabetes mellitus without complication ([place])    type 2   Family history of uterine cancer    Hyperlipidemia    Hypertension    Lynch syndrome    Obesity    Uterine cancer ([place]) 2016   MSH2/MSH6 LOH on IHC    Patient Active Problem List   Diagnosis Date Noted   Hyperlipidemia associated with type 2 diabetes mellitus ([place]) 04/22/2021   Gastritis and gastroduodenitis 83/15/1761   Helicobacter pylori infection 11/05/2019   Abnormal colonoscopy 11/05/2019   Other constipation 11/05/2019   Lynch syndrome 11/02/2018   History of colon cancer 11/02/2018   History of colonic polyps 11/02/2018   Facial lesion 11/02/2018   Peripheral edema 01/06/2018   Hepatic steatosis 03/11/2017   Dyspareunia, female 09/01/2016   Pain in joint, pelvic region and thigh 09/01/2016   Hypertension complicating diabetes ([place]) 08/17/2016    Pure hypercholesterolemia 06/05/2015   Overweight 06/03/2015   Diabetes mellitus ([place]) 06/03/2015   Back pain 06/03/2015   GERD (gastroesophageal reflux disease) 06/03/2015   Genetic testing 10/26/2014   Uterine cancer ([place])    Family history of uterine cancer    MSH2-related endometrial cancer ([place]) 10/04/2014   Endometrial cancer ([place]) 09/11/2014   Colon cancer ([place]) 08/10/2007    Past Surgical History:  Procedure Laterality Date   ABDOMINAL HYSTERECTOMY     abortions     twice    arm surgery     left arm   COLON SURGERY     COLON SURGERY  2010   tumor removal   COLONOSCOPY  02/17/2018   Mansouraty ta x 2, hems   FRACTURE SURGERY  2006   Lt lower arm   MULTIPLE TOOTH EXTRACTIONS     ROBOTIC ASSISTED TOTAL HYSTERECTOMY WITH BILATERAL SALPINGO OOPHERECTOMY Bilateral 09/11/2014   Procedure: ROBOTIC ASSISTED TOTAL LAPAROSCOPIC HYSTERECTOMY WITH BILATERAL SALPINGO OOPHORECTOMY LYMPH NODE DISSECTION; LYSIS OF ADHESIONS;  Surgeon: Janie Morning, MD;  Location: [facility];  Service: Gynecology;  Laterality: Bilateral;   TUBAL LIGATION  1993   UPPER GASTROINTESTINAL ENDOSCOPY      OB History     Gravida  6   Para  4   Term  4   Preterm  0   AB  2   Living  3      SAB  2   IAB  0   Ectopic  0   Multiple      Live Births  3           Home Medications    Prior to Admission medications   Medication Sig Start Date End Date Taking? Authorizing Provider  atorvastatin (LIPITOR) 40 MG tablet TAKE 1 TABLET BY MOUTH ONCE DAILY WITH EVENING MEAL 04/22/21   Charlott Rakes, MD  Blood Glucose Monitoring Suppl (AGAMATRIX PRESTO) w/Device KIT Check sugars before meals twice sugars 08/17/16   Mack Hook, MD  dapagliflozin propanediol (FARXIGA) 10 MG TABS tablet Take 1 tablet (10 mg total) by mouth daily. 04/22/21   Charlott Rakes, MD  gabapentin (NEURONTIN) 300 MG capsule Take 1 capsule (300 mg total) by mouth 2 (two) times daily. 04/22/21   Charlott Rakes, MD   glipiZIDE (GLUCOTROL) 5 MG tablet 1 tab by mouth twice daily with meals 04/22/21   Charlott Rakes, MD  glucose blood (AGAMATRIX PRESTO TEST) test strip Check sugars twice daily before meals 08/29/17   Mack Hook, MD  hydrochlorothiazide (HYDRODIURIL) 25 MG tablet Take 1 tablet (25 mg total) by mouth daily. 04/22/21   Charlott Rakes, MD  lisinopril (ZESTRIL) 5 MG tablet 1 tab by mouth daily with HCTZ 04/22/21   Charlott Rakes, MD  metFORMIN (GLUCOPHAGE) 1000 MG tablet TAKE 1 TABLET BY MOUTH TWICE DAILY WITH A MEAL 04/22/21   Charlott Rakes, MD  pantoprazole (PROTONIX) 40 MG tablet Take 1 tablet (40 mg total) by mouth daily. 02/24/21   Fenton Foy, NP  Simethicone 80 MG TABS Take 1 tablet (80 mg total) by mouth in the morning, at noon, and at bedtime. 04/25/20   Charlott Rakes, MD    Family History Family History  Problem Relation Age of Onset   Prostate cancer Father    Diabetes Mother    Uterine cancer Sister 63   Cancer Sister        colon cancer as well age 25?   Colon cancer Sister    Uterine cancer Maternal Aunt    Alcohol abuse Brother    Cirrhosis Brother        cause of death   Breast cancer Neg Hx    Rectal cancer Neg Hx    Stomach cancer Neg Hx     Social History Social History   Tobacco Use   Smoking status: Never   Smokeless tobacco: Never  Vaping Use   Vaping Use: Never used  Substance Use Topics   Alcohol use: No   Drug use: No    Allergies   Patient has no known allergies.   Review of Systems Review of Systems Negative, per HPI  Physical Exam Triage Vital Signs ED Triage Vitals  Enc Vitals Group     BP 11/20/21 1138 136/79     Pulse Rate 11/20/21 1138 94     Resp 11/20/21 1138 18     Temp 11/20/21 1138 98.9 F (37.2 C)     Temp Source 11/20/21 1138 Oral     SpO2 11/20/21 1138 100 %     Weight --      Height --      Head Circumference --      Peak Flow --      Pain Score 11/20/21 1139 0     Pain Loc --      Pain Edu? --       Excl. in [place]? --    No data found.  Updated Vital Signs BP 136/79  Pulse 94   Temp 98.9 F (37.2 C) (Oral)   Resp 18   LMP  (LMP Unknown)   SpO2 100%    Physical Exam Vitals and nursing note reviewed.  Constitutional:      Appearance: Normal appearance.  HENT:     Mouth/Throat:     Mouth: Mucous membranes are moist.     Pharynx: Oropharynx is clear.  Eyes:     Conjunctiva/sclera: Conjunctivae normal.  Cardiovascular:     Rate and Rhythm: Normal rate and regular rhythm.     Heart sounds: Normal heart sounds.  Pulmonary:     Effort: Pulmonary effort is normal. No respiratory distress.     Breath sounds: Normal breath sounds.  Abdominal:     General: Bowel sounds are normal.     Palpations: Abdomen is soft.     Tenderness: There is no abdominal tenderness. There is no guarding or rebound.  Musculoskeletal:        General: Normal range of motion.  Neurological:     Mental Status: She is alert and oriented to person, place, and time.     UC Treatments / Results  Labs (all labs ordered are listed, but only abnormal results are displayed) Labs Reviewed - No data to display  EKG  Radiology No results found.  Procedures Procedures   Medications Ordered in UC Medications - No data to display  Initial Impression / Assessment and Plan / UC Course  I have reviewed the triage vital signs and the nursing notes.  Pertinent labs & imaging results that were available during my care of the patient were reviewed by me and considered in my medical decision making (see chart for details).  Patient requesting upper endoscopy.  I discussed with her that the urgent care does not have these imaging capabilities.  She does have a primary care provider that she will follow-up with.  I recommend that she keep taking her daily Protonix. Discussed foods to avoid, sitting upright for 30 minutes after meals. No symptoms in clinic today and physical exam is unremarkable.  Discussed with  patient if she develops severe pain, vomiting, any blood in the stool or vomit, she needs to go to the emergency department.  Patient voices understanding and agrees to plan. Discharged in stable condition.  Final Clinical Impressions(s) / UC Diagnoses   Final diagnoses:  Gastroesophageal reflux disease, unspecified whether esophagitis present     Discharge Instructions      Contine tomando su medicamento (pantoprazol)  Evite los alimentos picantes y Estate agent un seguimiento con su proveedor de atencin primaria  Vaya al departamento de emergencias si sus sntomas empeoran    ED Prescriptions   None    PDMP not reviewed this encounter.   [place], [place], [place] 11/20/21 1414

## 2021-11-20 NOTE — ED Triage Notes (Signed)
Per interpreter, pt states hx of gastritis and having a flare up x3 days. C/o upper epigastric pain with burning. Denies taking any OTC meds.

## 2021-11-30 ENCOUNTER — Other Ambulatory Visit: Payer: Self-pay | Admitting: Family Medicine

## 2021-11-30 DIAGNOSIS — E1159 Type 2 diabetes mellitus with other circulatory complications: Secondary | ICD-10-CM

## 2021-11-30 DIAGNOSIS — E1169 Type 2 diabetes mellitus with other specified complication: Secondary | ICD-10-CM

## 2021-12-01 NOTE — Telephone Encounter (Signed)
Courtesy refill. Pt hs appt 12/30/21. Requested Prescriptions  Pending Prescriptions Disp Refills  . atorvastatin (LIPITOR) 40 MG tablet [Pharmacy Med Name: Atorvastatin Calcium 40 MG Oral Tablet] 90 tablet 0    Sig: TAKE 1 TABLET BY MOUTH ONCE DAILY WITH EVENING MEAL     Cardiovascular:  Antilipid - Statins Failed - 11/30/2021  5:05 PM      Failed - Lipid Panel in normal range within the last 12 months    Cholesterol, Total  Date Value Ref Range Status  10/18/2020 151 100 - 199 mg/dL Final   LDL Chol Calc (NIH)  Date Value Ref Range Status  10/18/2020 80 0 - 99 mg/dL Final   HDL  Date Value Ref Range Status  10/18/2020 51 >39 mg/dL Final   Triglycerides  Date Value Ref Range Status  10/18/2020 110 0 - 149 mg/dL Final         Passed - Patient is not pregnant      Passed - Valid encounter within last 12 months    Recent Outpatient Visits          7 months ago Type 2 diabetes mellitus with other neurologic complication, without long-term current use of insulin ([place])   [place], [employer], MD   9 months ago Gastroesophageal reflux disease, unspecified whether esophagitis present   [facility], Kriste Basque, NP   1 year ago Annual physical exam   [place], Charlane Ferretti, MD   1 year ago Type 2 diabetes mellitus with other neurologic complication, without long-term current use of insulin ([place])   [place], Charlane Ferretti, MD   1 year ago Type 2 diabetes mellitus with other specified complication, without long-term current use of insulin ([place])   [place], [place], MD      Future Appointments            In 4 weeks Charlott Rakes, MD [place]           . hydrochlorothiazide (HYDRODIURIL) 25 MG tablet [Pharmacy Med Name: hydroCHLOROthiazide 25 MG Oral Tablet] 90 tablet 0    Sig:  Take 1 tablet by mouth once daily     Cardiovascular: Diuretics - Thiazide Failed - 11/30/2021  5:05 PM      Failed - Cr in normal range and within 180 days    Creat  Date Value Ref Range Status  06/04/2015 0.66 0.50 - 1.05 mg/dL Final   Creatinine, Ser  Date Value Ref Range Status  04/24/2021 0.72 0.57 - 1.00 mg/dL Final   Creatinine, Urine  Date Value Ref Range Status  06/04/2015 204 20 - 320 mg/dL Final         Failed - K in normal range and within 180 days    Potassium  Date Value Ref Range Status  04/24/2021 4.3 3.5 - 5.2 mmol/L Final         Failed - Na in normal range and within 180 days    Sodium  Date Value Ref Range Status  04/24/2021 142 134 - 144 mmol/L Final         Failed - Valid encounter within last 6 months    Recent Outpatient Visits          7 months ago Type 2 diabetes mellitus with other neurologic complication, without long-term current use of insulin ([facility])   [facility], [place], MD   9 months ago Gastroesophageal reflux disease, unspecified whether esophagitis present   [facility] at [facility], Kriste Basque, NP   1 year ago Annual physical exam   [place], Charlane Ferretti, MD   1 year ago Type 2 diabetes mellitus with other neurologic complication, without long-term current use of insulin ([place])   [place], Charlane Ferretti, MD   1 year ago Type 2 diabetes mellitus with other specified complication, without long-term current use of insulin ([place])   [place], Enobong, MD      Future Appointments            In 4 weeks Charlott Rakes, MD [place] - Last BP in normal range    BP Readings from Last 1 Encounters:  11/20/21 136/79

## 2021-12-08 ENCOUNTER — Other Ambulatory Visit: Payer: Self-pay | Admitting: Family Medicine

## 2021-12-08 DIAGNOSIS — I152 Hypertension secondary to endocrine disorders: Secondary | ICD-10-CM

## 2021-12-08 DIAGNOSIS — E1169 Type 2 diabetes mellitus with other specified complication: Secondary | ICD-10-CM

## 2021-12-10 ENCOUNTER — Other Ambulatory Visit: Payer: Self-pay | Admitting: Pharmacist

## 2021-12-10 DIAGNOSIS — I152 Hypertension secondary to endocrine disorders: Secondary | ICD-10-CM

## 2021-12-10 MED ORDER — HYDROCHLOROTHIAZIDE 25 MG PO TABS: 25.0000 mg | ORAL_TABLET | Freq: Every day | ORAL | 0 refills | Status: DC

## 2021-12-30 ENCOUNTER — Ambulatory Visit: Payer: Self-pay | Attending: Family Medicine | Admitting: Family Medicine

## 2021-12-30 ENCOUNTER — Encounter: Payer: Self-pay | Admitting: Family Medicine

## 2021-12-30 VITALS — BP 110/72 | HR 101 | Ht 62.0 in | Wt 145.2 lb

## 2021-12-30 DIAGNOSIS — I152 Hypertension secondary to endocrine disorders: Secondary | ICD-10-CM

## 2021-12-30 DIAGNOSIS — E785 Hyperlipidemia, unspecified: Secondary | ICD-10-CM

## 2021-12-30 DIAGNOSIS — E1149 Type 2 diabetes mellitus with other diabetic neurological complication: Secondary | ICD-10-CM

## 2021-12-30 DIAGNOSIS — E1169 Type 2 diabetes mellitus with other specified complication: Secondary | ICD-10-CM

## 2021-12-30 DIAGNOSIS — E1159 Type 2 diabetes mellitus with other circulatory complications: Secondary | ICD-10-CM

## 2021-12-30 DIAGNOSIS — Z1509 Genetic susceptibility to other malignant neoplasm: Secondary | ICD-10-CM

## 2021-12-30 DIAGNOSIS — K219 Gastro-esophageal reflux disease without esophagitis: Secondary | ICD-10-CM

## 2021-12-30 LAB — POCT GLYCOSYLATED HEMOGLOBIN (HGB A1C): HbA1c, POC (controlled diabetic range): 7.6 % — AB (ref 0.0–7.0)

## 2021-12-30 LAB — GLUCOSE, POCT (MANUAL RESULT ENTRY): POC Glucose: 139 mg/dl — AB (ref 70–99)

## 2021-12-30 MED ORDER — LISINOPRIL 5 MG PO TABS: ORAL_TABLET | ORAL | 6 refills | Status: DC

## 2021-12-30 MED ORDER — FAMOTIDINE 20 MG PO TABS: 20.0000 mg | ORAL_TABLET | Freq: Every day | ORAL | 3 refills | Status: DC

## 2021-12-30 MED ORDER — DULOXETINE HCL 60 MG PO CPEP: 60.0000 mg | ORAL_CAPSULE | Freq: Every day | ORAL | 6 refills | Status: DC

## 2021-12-30 MED ORDER — ATORVASTATIN CALCIUM 40 MG PO TABS: 40.0000 mg | ORAL_TABLET | Freq: Every day | ORAL | 1 refills | Status: DC

## 2021-12-30 MED ORDER — HYDROCHLOROTHIAZIDE 25 MG PO TABS: 25.0000 mg | ORAL_TABLET | Freq: Every day | ORAL | 6 refills | Status: DC

## 2021-12-30 MED ORDER — GLIPIZIDE 5 MG PO TABS: ORAL_TABLET | ORAL | 6 refills | Status: DC

## 2021-12-30 MED ORDER — PANTOPRAZOLE SODIUM 40 MG PO TBEC: 40.0000 mg | DELAYED_RELEASE_TABLET | Freq: Every day | ORAL | 1 refills | Status: DC

## 2021-12-30 MED ORDER — METFORMIN HCL 1000 MG PO TABS: ORAL_TABLET | ORAL | 6 refills | Status: DC

## 2021-12-30 MED ORDER — DAPAGLIFLOZIN PROPANEDIOL 10 MG PO TABS: 10.0000 mg | ORAL_TABLET | Freq: Every day | ORAL | 6 refills | Status: DC

## 2021-12-30 NOTE — Patient Instructions (Signed)
Diabetes Mellitus and Foot Care Foot care is an important part of your health, especially when you have diabetes. Diabetes may cause you to have problems because of poor blood flow (circulation) to your feet and legs, which can cause your skin to: Become thinner and drier. Break more easily. Heal more slowly. Peel and crack. You may also have nerve damage (neuropathy) in your legs and feet, causing decreased feeling in them. This means that you may not notice minor injuries to your feet that could lead to more serious problems. Noticing and addressing any potential problems early is the best way to prevent future foot problems. How to care for your feet Foot hygiene  Wash your feet daily with warm water and mild soap. Do not use hot water. Then, pat your feet and the areas between your toes until they are completely dry. Do not soak your feet as this can dry your skin. Trim your toenails straight across. Do not dig under them or around the cuticle. File the edges of your nails with an emery board or nail file. Apply a moisturizing lotion or petroleum jelly to the skin on your feet and to dry, brittle toenails. Use lotion that does not contain alcohol and is unscented. Do not apply lotion between your toes. Shoes and socks Wear clean socks or stockings every day. Make sure they are not too tight. Do not wear knee-high stockings since they may decrease blood flow to your legs. Wear shoes that fit properly and have enough cushioning. Always look in your shoes before you put them on to be sure there are no objects inside. To break in new shoes, wear them for just a few hours a day. This prevents injuries on your feet. Wounds, scrapes, corns, and calluses  Check your feet daily for blisters, cuts, bruises, sores, and redness. If you cannot see the bottom of your feet, use a mirror or ask someone for help. Do not cut corns or calluses or try to remove them with medicine. If you find a minor scrape,  cut, or break in the skin on your feet, keep it and the skin around it clean and dry. You may clean these areas with mild soap and water. Do not clean the area with peroxide, alcohol, or iodine. If you have a wound, scrape, corn, or callus on your foot, look at it several times a day to make sure it is healing and not infected. Check for: Redness, swelling, or pain. Fluid or blood. Warmth. Pus or a bad smell. General tips Do not cross your legs. This may decrease blood flow to your feet. Do not use heating pads or hot water bottles on your feet. They may burn your skin. If you have lost feeling in your feet or legs, you may not know this is happening until it is too late. Protect your feet from hot and cold by wearing shoes, such as at the beach or on hot pavement. Schedule a complete foot exam at least once a year (annually) or more often if you have foot problems. Report any cuts, sores, or bruises to your health care provider immediately. Where to find more information American Diabetes Association: www.diabetes.org Association of Diabetes Care & Education Specialists: www.diabeteseducator.org Contact a health care provider if: You have a medical condition that increases your risk of infection and you have any cuts, sores, or bruises on your feet. You have an injury that is not healing. You have redness on your legs or feet. You   feel burning or tingling in your legs or feet. You have pain or cramps in your legs and feet. Your legs or feet are numb. Your feet always feel cold. You have pain around any toenails. Get help right away if: You have a wound, scrape, corn, or callus on your foot and: You have pain, swelling, or redness that gets worse. You have fluid or blood coming from the wound, scrape, corn, or callus. Your wound, scrape, corn, or callus feels warm to the touch. You have pus or a bad smell coming from the wound, scrape, corn, or callus. You have a fever. You have a red  line going up your leg. Summary Check your feet every day for blisters, cuts, bruises, sores, and redness. Apply a moisturizing lotion or petroleum jelly to the skin on your feet and to dry, brittle toenails. Wear shoes that fit properly and have enough cushioning. If you have foot problems, report any cuts, sores, or bruises to your health care provider immediately. Schedule a complete foot exam at least once a year (annually) or more often if you have foot problems. This information is not intended to replace advice given to you by your health care provider. Make sure you discuss any questions you have with your health care provider. Document Revised: 12/21/2019 Document Reviewed: 12/21/2019 Elsevier Patient Education  2023 Elsevier Inc.  

## 2021-12-30 NOTE — Progress Notes (Signed)
Subjective:  Patient ID: Laura Douglas, female    DOB: Jul 27, 1960  Age: 61 y.o. MRN: [id number]  CC: Diabetes   HPI GERENE Douglas is a 61 y.o. year old female with a history of Lynch syndrome, colon cancer in 2009 (status post resection), endometrial cancer (status post TAHBSO LND in 08/2014 with pathology revealing stage Ia grade 1 endometrioid endometrial cancer with no lymphovascular space invasion and negative lymph nodes) type 2 diabetes mellitus (A1c 7.6)  Interval History: She Complains of gastritis symptoms worse at night despite adherence with her PPI. She is yet to hear back from GI regarding surveillance Colonoscopy for her Lynch syndrome. Last referral was placed in 10/2021.  She has been unable to tolerate Gabapentin due to numbness hence she discontinued it but states numbness is still present in her hands.  Endorses adherence with her Diabetes medications and has no hypoglycemic symptoms or visual concerns. Adherent with her statin and her antihypertensive.   Past Medical History:  Diagnosis Date   Colon cancer [facility])    possible? Patient had surgery in [place]. Denies needing adjuvant therapy. The surgery "removed a tumor from the colon".   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    Past Surgical History:  Procedure Laterality Date   ABDOMINAL HYSTERECTOMY     abortions     twice    arm surgery     left arm   COLON SURGERY     COLON SURGERY  2010   tumor removal   COLONOSCOPY  02/17/2018   Mansouraty ta x 2, hems   FRACTURE SURGERY  2006   Lt lower arm   MULTIPLE TOOTH EXTRACTIONS     ROBOTIC ASSISTED TOTAL HYSTERECTOMY WITH BILATERAL SALPINGO OOPHERECTOMY Bilateral 09/11/2014   Procedure: ROBOTIC ASSISTED TOTAL LAPAROSCOPIC HYSTERECTOMY  WITH BILATERAL SALPINGO OOPHORECTOMY LYMPH NODE DISSECTION; LYSIS OF ADHESIONS;  Surgeon: Janie Morning, MD;  Location: [facility];  Service: Gynecology;  Laterality: Bilateral;   TUBAL LIGATION  1993   UPPER GASTROINTESTINAL ENDOSCOPY      Family History  Problem Relation Age of Onset   Prostate cancer Father    Diabetes Mother    Uterine cancer Sister 52   Cancer Sister        colon cancer as well age 4?   Colon cancer Sister    Uterine cancer Maternal Aunt    Alcohol abuse Brother    Cirrhosis Brother        cause of death   Breast cancer Neg Hx    Rectal cancer Neg Hx    Stomach cancer Neg Hx     Social History   Socioeconomic History   Marital status: Married    Spouse name: Not on file   Number of children: 4   Years of education: Not on file   Highest education level: Not on file  Occupational History   Occupation: [employer] work.  Tobacco Use   Smoking status: Never   Smokeless tobacco: Never  Vaping Use   Vaping Use: Never used  Substance and Sexual Activity   Alcohol use: No   Drug use: No   Sexual activity: Yes    Birth control/protection: Surgical    Comment: Hysterectomy  Other Topics Concern   Not on file  Social History Narrative   Lives at home with her female partner   Only son killed by gang violence in [place].   Social Determinants of Health   Financial Resource Strain: Not on file  Food Insecurity: Not on file  Transportation Needs: Not on file  Physical Activity: Inactive (07/01/2017)   Exercise Vital Sign    Days of Exercise per Week: 0 days    Minutes of Exercise per Session: 0 min  Stress: No Stress Concern Present (07/01/2017)   [place]    Feeling of Stress : Only a little  Social Connections: Somewhat Isolated (07/01/2017)   Social Connection and Isolation Panel [NHANES]    Frequency of Communication with Friends and Family: More than three times a week    Frequency  of Social Gatherings with Friends and Family: Three times a week    Attends Religious Services: Never    Active Member of Clubs or Organizations: No    Attends Archivist Meetings: Never    Marital Status: Living with partner    No Known Allergies  Outpatient Medications Prior to Visit  Medication Sig Dispense Refill   Blood Glucose Monitoring Suppl (AGAMATRIX PRESTO) w/Device KIT Check sugars before meals twice sugars 1 kit 0   glucose blood (AGAMATRIX PRESTO TEST) test strip Check sugars twice daily before meals 100 each 12   Simethicone 80 MG TABS Take 1 tablet (80 mg total) by mouth in the morning, at noon, and at bedtime. 60 tablet 2   atorvastatin (LIPITOR) 40 MG tablet TAKE 1 TABLET BY MOUTH ONCE DAILY WITH EVENING MEAL 90 tablet 0   dapagliflozin propanediol (FARXIGA) 10 MG TABS tablet Take 1 tablet (10 mg total) by mouth daily. 30 tablet 6   glipiZIDE (GLUCOTROL) 5 MG tablet 1 tab by mouth twice daily with meals 60 tablet 6   hydrochlorothiazide (HYDRODIURIL) 25 MG tablet Take 1 tablet (25 mg total) by mouth daily. 30 tablet 0   lisinopril (ZESTRIL) 5 MG tablet 1 tab by mouth daily with HCTZ 30 tablet 6   metFORMIN (GLUCOPHAGE) 1000 MG tablet TAKE 1 TABLET BY MOUTH TWICE DAILY WITH A MEAL 60 tablet 6   pantoprazole (PROTONIX) 40 MG tablet TAKE ONE TABLET BY MOUTH ONCE DAILY 30 tablet 0   gabapentin (NEURONTIN) 300 MG capsule Take 1 capsule (300 mg total) by mouth 2 (two) times daily. (Patient not taking: Reported on 12/30/2021) 60 capsule 6   No facility-administered medications prior to visit.     ROS Review of Systems  Constitutional:  Negative for activity change and appetite change.  HENT:  Negative for sinus pressure and sore throat.   Respiratory:  Negative for chest tightness, shortness of breath and wheezing.   Cardiovascular:  Negative for chest pain and palpitations.  Gastrointestinal:  Negative for abdominal distention, abdominal pain and constipation.   Genitourinary: Negative.   Musculoskeletal: Negative.   Psychiatric/Behavioral:  Negative for behavioral problems and dysphoric mood.       Objective:  BP 110/72   Pulse (!) 101   Ht '5\' 2"'  (1.575 m)   Wt 145 lb 3.2 oz (65.9 kg)   LMP  (LMP Unknown)   SpO2 97%   BMI 26.56 kg/m      12/30/2021    2:38 PM 11/20/2021   11:38 AM 04/22/2021    9:17 AM  BP/Weight  Systolic BP 320 233 435  Diastolic BP 72 79 85  Wt. (Lbs) 145.2  154.4  BMI 26.56 kg/m2  28.24 kg/m2      Physical Exam Constitutional:      Appearance: She is well-developed.  Cardiovascular:     Rate and Rhythm: Tachycardia present.     Heart sounds: Normal heart sounds. No murmur heard. Pulmonary:     Effort: Pulmonary effort is normal.     Breath sounds: Normal breath sounds. No wheezing or rales.  Chest:     Chest wall: No tenderness.  Abdominal:     General: Bowel sounds are normal. There is no distension.     Palpations: Abdomen is soft. There is no mass.     Tenderness: There is no abdominal tenderness.  Musculoskeletal:        General: Normal range of motion.     Right lower leg: No edema.     Left lower leg: No edema.  Neurological:     Mental Status: She is alert and oriented to person, place, and time.  Psychiatric:        Mood and Affect: Mood normal.      Diabetic Foot Exam - Simple   Simple Foot Form Diabetic Foot exam was performed with the following findings: Yes 12/30/2021  3:17 PM  Visual Inspection No deformities, no ulcerations, no other skin breakdown bilaterally: Yes Sensation Testing Intact to touch and monofilament testing bilaterally: Yes Pulse Check Posterior Tibialis and Dorsalis pulse intact bilaterally: Yes Comments        Latest Ref Rng & Units 04/24/2021   10:37 AM 10/18/2020    9:57 AM 04/25/2020   10:21 AM  CMP  Glucose 70 - 99 mg/dL 119  105  172   BUN 6 - 24 mg/dL '16  22  17   ' Creatinine 0.57 - 1.00 mg/dL 0.72  0.64  0.87   Sodium 134 - 144 mmol/L 142   141  139   Potassium 3.5 - 5.2 mmol/L 4.3  4.0  4.0   Chloride 96 - 106 mmol/L 102  98  98   CO2 20 - 29 mmol/L '22  24  25   ' Calcium 8.7 - 10.2 mg/dL 9.6  9.9  10.3   Total Protein 6.0 - 8.5 g/dL  7.1    Total Bilirubin 0.0 - 1.2 mg/dL  0.5    Alkaline Phos 44 - 121 IU/L  51    AST 0 - 40 IU/L  32    ALT 0 - 32 IU/L  29      Lipid Panel     Component Value Date/Time   CHOL 151 10/18/2020 0957   TRIG 110 10/18/2020 0957   HDL 51 10/18/2020 0957   CHOLHDL 3.0 10/18/2020 0957   CHOLHDL 6.7 (H) 06/04/2015 0925   VLDL 67 (H) 06/04/2015 0925   LDLCALC 80 10/18/2020 0957    CBC    Component Value Date/Time   WBC 6.4 11/17/2016 0852   WBC 7.5 03/12/2016 1122   RBC 4.96 11/17/2016 0852   RBC 4.77 03/12/2016 1122   HGB 13.7 11/17/2016 0852   HCT 42.8 11/17/2016 0852   PLT 237 11/17/2016 0852   MCV 86 11/17/2016 0852   MCH 27.6 11/17/2016 0852   MCH 28.7 03/12/2016 1122   MCHC 32.0 11/17/2016 0852   MCHC 33.1 03/12/2016 1122   RDW 13.1 11/17/2016 0852   LYMPHSABS 2.7 11/17/2016 0852   MONOABS 0.4 03/12/2016 1122   EOSABS 0.2 11/17/2016 0852   BASOSABS  0.0 11/17/2016 3335    Lab Results  Component Value Date   HGBA1C 7.6 (A) 12/30/2021    Assessment & Plan:  1. Type 2 diabetes mellitus with other neurologic complication, without long-term current use of insulin (HCC) Slightly above goal with A1c of 7.6 but improved from 8.6 previously; goal is <7.0 No regimen change today Counseled on Diabetic diet, my plate method, 456 minutes of moderate intensity exercise/week Blood sugar logs with fasting goals of 80-120 mg/dl, random of less than 180 and in the event of sugars less than 60 mg/dl or greater than 400 mg/dl encouraged to notify the clinic. Advised on the need for annual eye exams, annual foot exams, Pneumonia vaccine. - POCT glucose (manual entry) - POCT glycosylated hemoglobin (Hb A1C) - Microalbumin / creatinine urine ratio; Future - LP+Non-HDL Cholesterol;  Future - CMP14+EGFR; Future - glipiZIDE (GLUCOTROL) 5 MG tablet; 1 tab by mouth twice daily with meals  Dispense: 60 tablet; Refill: 6 - dapagliflozin propanediol (FARXIGA) 10 MG TABS tablet; Take 1 tablet (10 mg total) by mouth daily.  Dispense: 30 tablet; Refill: 6 - DULoxetine (CYMBALTA) 60 MG capsule; Take 1 capsule (60 mg total) by mouth daily. For neuropathy  Dispense: 30 capsule; Refill: 6  2. Hyperlipidemia associated with type 2 diabetes mellitus (HCC) Controlled Low cholesterol diet - atorvastatin (LIPITOR) 40 MG tablet; Take 1 tablet (40 mg total) by mouth daily.  Dispense: 90 tablet; Refill: 1 - metFORMIN (GLUCOPHAGE) 1000 MG tablet; TAKE 1 TABLET BY MOUTH TWICE DAILY WITH A MEAL  Dispense: 60 tablet; Refill: 6  3. Hypertension complicating diabetes ([place]) Controlled Counseled on blood pressure goal of less than 130/80, low-sodium, DASH diet, medication compliance, 150 minutes of moderate intensity exercise per week. Discussed medication compliance, adverse effects. - hydrochlorothiazide (HYDRODIURIL) 25 MG tablet; Take 1 tablet (25 mg total) by mouth daily.  Dispense: 30 tablet; Refill: 6 - lisinopril (ZESTRIL) 5 MG tablet; 1 tab by mouth daily with HCTZ  Dispense: 30 tablet; Refill: 6  4. Lynch syndrome Due for surveillance endoscopy - Ambulatory referral to Gastroenterology  5. Gastroesophageal reflux disease without esophagitis Uncontrolled Pepcid added to regimen - famotidine (PEPCID) 20 MG tablet; Take 1 tablet (20 mg total) by mouth at bedtime.  Dispense: 30 tablet; Refill: 3 - pantoprazole (PROTONIX) 40 MG tablet; Take 1 tablet (40 mg total) by mouth daily.  Dispense: 90 tablet; Refill: 1   Meds ordered this encounter  Medications   famotidine (PEPCID) 20 MG tablet    Sig: Take 1 tablet (20 mg total) by mouth at bedtime.    Dispense:  30 tablet    Refill:  3   atorvastatin (LIPITOR) 40 MG tablet    Sig: Take 1 tablet (40 mg total) by mouth daily.    Dispense:   90 tablet    Refill:  1   glipiZIDE (GLUCOTROL) 5 MG tablet    Sig: 1 tab by mouth twice daily with meals    Dispense:  60 tablet    Refill:  6   hydrochlorothiazide (HYDRODIURIL) 25 MG tablet    Sig: Take 1 tablet (25 mg total) by mouth daily.    Dispense:  30 tablet    Refill:  6    Must keep upcoming appointment for additional refills.   lisinopril (ZESTRIL) 5 MG tablet    Sig: 1 tab by mouth daily with HCTZ    Dispense:  30 tablet    Refill:  6   metFORMIN (GLUCOPHAGE) 1000 MG tablet  Sig: TAKE 1 TABLET BY MOUTH TWICE DAILY WITH A MEAL    Dispense:  60 tablet    Refill:  6   pantoprazole (PROTONIX) 40 MG tablet    Sig: Take 1 tablet (40 mg total) by mouth daily.    Dispense:  90 tablet    Refill:  1   dapagliflozin propanediol (FARXIGA) 10 MG TABS tablet    Sig: Take 1 tablet (10 mg total) by mouth daily.    Dispense:  30 tablet    Refill:  6    Discontinue Invokana   DULoxetine (CYMBALTA) 60 MG capsule    Sig: Take 1 capsule (60 mg total) by mouth daily. For neuropathy    Dispense:  30 capsule    Refill:  6    Discontinue gabapentin    Follow-up: Return in about 6 months (around 07/02/2022) for Chronic medical conditions.       Charlott Rakes, MD, FAAFP. [facility], [place]   12/30/2021, 4:31 PM

## 2022-01-02 ENCOUNTER — Ambulatory Visit: Payer: Self-pay | Attending: Family Medicine

## 2022-01-02 DIAGNOSIS — E1149 Type 2 diabetes mellitus with other diabetic neurological complication: Secondary | ICD-10-CM

## 2022-01-03 LAB — CMP14+EGFR
ALT: 24 IU/L (ref 0–32)
AST: 23 IU/L (ref 0–40)
Albumin/Globulin Ratio: 2 (ref 1.2–2.2)
Albumin: 4.7 g/dL (ref 3.8–4.9)
Alkaline Phosphatase: 54 IU/L (ref 44–121)
BUN/Creatinine Ratio: 25 — ABNORMAL HIGH (ref 9–23)
BUN: 18 mg/dL (ref 6–24)
Bilirubin Total: 0.6 mg/dL (ref 0.0–1.2)
CO2: 23 mmol/L (ref 20–29)
Calcium: 9.9 mg/dL (ref 8.7–10.2)
Chloride: 100 mmol/L (ref 96–106)
Creatinine, Ser: 0.71 mg/dL (ref 0.57–1.00)
Globulin, Total: 2.4 g/dL (ref 1.5–4.5)
Glucose: 126 mg/dL — ABNORMAL HIGH (ref 70–99)
Potassium: 4.2 mmol/L (ref 3.5–5.2)
Sodium: 140 mmol/L (ref 134–144)
Total Protein: 7.1 g/dL (ref 6.0–8.5)
eGFR: 98 mL/min/{1.73_m2} (ref >59)

## 2022-01-03 LAB — MICROALBUMIN / CREATININE URINE RATIO
Creatinine, Urine: 176.7 mg/dL
Microalb/Creat Ratio: 14 mg/g creat (ref 0–29)
Microalbumin, Urine: 25 ug/mL

## 2022-01-03 LAB — LP+NON-HDL CHOLESTEROL
Cholesterol, Total: 157 mg/dL (ref 100–199)
HDL: 52 mg/dL (ref >39)
LDL Chol Calc (NIH): 85 mg/dL (ref 0–99)
Total Non-HDL-Chol (LDL+VLDL): 105 mg/dL (ref 0–129)
Triglycerides: 109 mg/dL (ref 0–149)
VLDL Cholesterol Cal: 20 mg/dL (ref 5–40)

## 2022-01-06 NOTE — Telephone Encounter (Signed)
error 

## 2022-01-14 ENCOUNTER — Encounter: Payer: Self-pay | Admitting: Family Medicine

## 2022-01-19 ENCOUNTER — Telehealth: Payer: Self-pay

## 2022-01-19 NOTE — Telephone Encounter (Signed)
Spanish interpreter [phone] [facility] Pt given lab results per notes of Dr. Margarita Rana on 01/19/22. Pt verbalized understanding. Attempted to transfer with caller and interpreter on the line, almost disconnected the call if NT was to hang up. Interpreter called pt and then will call number at [place]. Pt stated that she has tried to get an appt but stated the office stated that they did not have an interpreter.

## 2022-07-06 ENCOUNTER — Ambulatory Visit: Payer: 59 | Attending: Family Medicine | Admitting: Family Medicine

## 2022-07-06 ENCOUNTER — Encounter: Payer: Self-pay | Admitting: Family Medicine

## 2022-07-06 VITALS — BP 120/79 | HR 100 | Ht 62.0 in | Wt 145.8 lb

## 2022-07-06 DIAGNOSIS — E1169 Type 2 diabetes mellitus with other specified complication: Secondary | ICD-10-CM | POA: Diagnosis not present

## 2022-07-06 DIAGNOSIS — E1149 Type 2 diabetes mellitus with other diabetic neurological complication: Secondary | ICD-10-CM | POA: Diagnosis not present

## 2022-07-06 DIAGNOSIS — I152 Hypertension secondary to endocrine disorders: Secondary | ICD-10-CM

## 2022-07-06 DIAGNOSIS — K219 Gastro-esophageal reflux disease without esophagitis: Secondary | ICD-10-CM | POA: Diagnosis not present

## 2022-07-06 DIAGNOSIS — E1159 Type 2 diabetes mellitus with other circulatory complications: Secondary | ICD-10-CM | POA: Diagnosis not present

## 2022-07-06 DIAGNOSIS — K08539 Fractured dental restorative material, unspecified: Secondary | ICD-10-CM

## 2022-07-06 DIAGNOSIS — E785 Hyperlipidemia, unspecified: Secondary | ICD-10-CM

## 2022-07-06 DIAGNOSIS — Z1231 Encounter for screening mammogram for malignant neoplasm of breast: Secondary | ICD-10-CM

## 2022-07-06 LAB — POCT GLYCOSYLATED HEMOGLOBIN (HGB A1C): HbA1c, POC (controlled diabetic range): 8.5 % — AB (ref 0.0–7.0)

## 2022-07-06 LAB — GLUCOSE, POCT (MANUAL RESULT ENTRY): POC Glucose: 270 mg/dl — AB (ref 70–99)

## 2022-07-06 MED ORDER — PANTOPRAZOLE SODIUM 40 MG PO TBEC: 40.0000 mg | DELAYED_RELEASE_TABLET | Freq: Every day | ORAL | 1 refills | Status: DC

## 2022-07-06 MED ORDER — HYDROCHLOROTHIAZIDE 25 MG PO TABS: 25.0000 mg | ORAL_TABLET | Freq: Every day | ORAL | 1 refills | Status: DC

## 2022-07-06 MED ORDER — METFORMIN HCL 1000 MG PO TABS: ORAL_TABLET | ORAL | 1 refills | Status: DC

## 2022-07-06 MED ORDER — LISINOPRIL 5 MG PO TABS: ORAL_TABLET | ORAL | 1 refills | Status: DC

## 2022-07-06 MED ORDER — ATORVASTATIN CALCIUM 40 MG PO TABS: 40.0000 mg | ORAL_TABLET | Freq: Every day | ORAL | 1 refills | Status: DC

## 2022-07-06 MED ORDER — GLIPIZIDE 10 MG PO TABS: 10.0000 mg | ORAL_TABLET | Freq: Two times a day (BID) | ORAL | 1 refills | Status: DC

## 2022-07-06 NOTE — Patient Instructions (Signed)
Please call to schedule your colonoscopy:  [facility] [address] [place], [place] [address] PH# [phone] 

## 2022-07-06 NOTE — Progress Notes (Signed)
Subjective:  Patient ID: Laura Douglas, female    DOB: 1961/06/08  Age: 62 y.o. MRN: [id number]  CC: Diabetes   HPI TONNYA GARBETT is a 62 y.o. year old female with a history of Lynch syndrome, colon cancer in 2009 (status post resection), endometrial cancer (status post TAHBSO LND in 08/2014 with pathology revealing stage Ia grade 1 endometrioid endometrial cancer with no lymphovascular space invasion and negative lymph nodes) type 2 diabetes mellitus (A1c 7.6).  Interval History:  A1c is 8.5 up from 7.6 previously. She endorses adherence with Glipizide, Metformin but stopped [place] as she states it made her dizzy along with Cymbalta. Cymbalta had been initiated due to inability to tolerate Gabapentin for neuropathy. She states neuropathy is still present in her hands but is slightly better.  She has no hypoglycemia, no visual concerns.  Requests referral to Ophthalomology and also to the Dentist as her 'prosthesis' has broken Past Medical History:  Diagnosis Date   Colon cancer [facility])    possible? Patient had surgery in [place]. Denies needing adjuvant therapy. The surgery "removed a tumor from the colon".   Constipation    occasional   Diabetes mellitus without complication ([place])    type 2   Family history of uterine cancer    Hyperlipidemia    Hypertension    Lynch syndrome    Obesity    Uterine cancer ([place]) 2016   MSH2/MSH6 LOH on IHC    Past Surgical History:  Procedure Laterality Date   ABDOMINAL HYSTERECTOMY     abortions     twice    arm surgery     left arm   COLON SURGERY     COLON SURGERY  2010   tumor removal   COLONOSCOPY  02/17/2018   Mansouraty ta x 2, hems   FRACTURE SURGERY  2006   Lt lower arm   MULTIPLE TOOTH EXTRACTIONS     ROBOTIC ASSISTED TOTAL HYSTERECTOMY WITH BILATERAL SALPINGO OOPHERECTOMY Bilateral 09/11/2014   Procedure: ROBOTIC ASSISTED TOTAL LAPAROSCOPIC HYSTERECTOMY WITH BILATERAL SALPINGO OOPHORECTOMY LYMPH NODE  DISSECTION; LYSIS OF ADHESIONS;  Surgeon: Janie Morning, MD;  Location: [facility];  Service: Gynecology;  Laterality: Bilateral;   TUBAL LIGATION  1993   UPPER GASTROINTESTINAL ENDOSCOPY      Family History  Problem Relation Age of Onset   Prostate cancer Father    Diabetes Mother    Uterine cancer Sister 21   Cancer Sister        colon cancer as well age 49?   Colon cancer Sister    Uterine cancer Maternal Aunt    Alcohol abuse Brother    Cirrhosis Brother        cause of death   Breast cancer Neg Hx    Rectal cancer Neg Hx    Stomach cancer Neg Hx     Social History   Socioeconomic History   Marital status: Married    Spouse name: Not on file   Number of children: 4   Years of education: Not on file   Highest education level: Not on file  Occupational History   Occupation: [employer] work.  Tobacco Use   Smoking status: Never   Smokeless tobacco: Never  Vaping Use   Vaping Use: Never used  Substance and Sexual Activity   Alcohol use: No   Drug use: No   Sexual activity: Yes    Birth control/protection: Surgical    Comment: Hysterectomy  Other Topics Concern   Not on  file  Social History Narrative   Lives at home with her female partner   Only son killed by gang violence in [place].   Social Determinants of Health   Financial Resource Strain: Not on file  Food Insecurity: Not on file  Transportation Needs: Not on file  Physical Activity: Inactive (07/01/2017)   Exercise Vital Sign    Days of Exercise per Week: 0 days    Minutes of Exercise per Session: 0 min  Stress: No Stress Concern Present (07/01/2017)   [place]    Feeling of Stress : Only a little  Social Connections: Somewhat Isolated (07/01/2017)   Social Connection and Isolation Panel [NHANES]    Frequency of Communication with Friends and Family: More than three times a week    Frequency of Social Gatherings with Friends and Family:  Three times a week    Attends Religious Services: Never    Active Member of Clubs or Organizations: No    Attends Archivist Meetings: Never    Marital Status: Living with partner    No Known Allergies  Outpatient Medications Prior to Visit  Medication Sig Dispense Refill   Blood Glucose Monitoring Suppl (AGAMATRIX PRESTO) w/Device KIT Check sugars before meals twice sugars 1 kit 0   famotidine (PEPCID) 20 MG tablet Take 1 tablet (20 mg total) by mouth at bedtime. 30 tablet 3   glucose blood (AGAMATRIX PRESTO TEST) test strip Check sugars twice daily before meals 100 each 12   Simethicone 80 MG TABS Take 1 tablet (80 mg total) by mouth in the morning, at noon, and at bedtime. 60 tablet 2   atorvastatin (LIPITOR) 40 MG tablet Take 1 tablet (40 mg total) by mouth daily. 90 tablet 1   glipiZIDE (GLUCOTROL) 5 MG tablet 1 tab by mouth twice daily with meals 60 tablet 6   hydrochlorothiazide (HYDRODIURIL) 25 MG tablet Take 1 tablet (25 mg total) by mouth daily. 30 tablet 6   lisinopril (ZESTRIL) 5 MG tablet 1 tab by mouth daily with HCTZ 30 tablet 6   metFORMIN (GLUCOPHAGE) 1000 MG tablet TAKE 1 TABLET BY MOUTH TWICE DAILY WITH A MEAL 60 tablet 6   pantoprazole (PROTONIX) 40 MG tablet Take 1 tablet (40 mg total) by mouth daily. 90 tablet 1   dapagliflozin propanediol (FARXIGA) 10 MG TABS tablet Take 1 tablet (10 mg total) by mouth daily. (Patient not taking: Reported on 07/06/2022) 30 tablet 6   DULoxetine (CYMBALTA) 60 MG capsule Take 1 capsule (60 mg total) by mouth daily. For neuropathy (Patient not taking: Reported on 07/06/2022) 30 capsule 6   No facility-administered medications prior to visit.     ROS Review of Systems  Constitutional:  Negative for activity change and appetite change.  HENT:  Positive for dental problem. Negative for sinus pressure and sore throat.   Respiratory:  Negative for chest tightness, shortness of breath and wheezing.   Cardiovascular:  Negative  for chest pain and palpitations.  Gastrointestinal:  Negative for abdominal distention, abdominal pain and constipation.  Genitourinary: Negative.   Musculoskeletal: Negative.   Neurological:  Positive for numbness.  Psychiatric/Behavioral:  Negative for behavioral problems and dysphoric mood.     Objective:  BP 120/79   Pulse 100   Ht '5\' 2"'$  (1.575 m)   Wt 145 lb 12.8 oz (66.1 kg)   LMP  (LMP Unknown)   SpO2 99%   BMI 26.67 kg/m  07/06/2022    3:00 PM 12/30/2021    2:38 PM 11/20/2021   11:38 AM  BP/Weight  Systolic BP 599 357 017  Diastolic BP 79 72 79  Wt. (Lbs) 145.8 145.2   BMI 26.67 kg/m2 26.56 kg/m2       Physical Exam Constitutional:      Appearance: She is well-developed.  Cardiovascular:     Rate and Rhythm: Normal rate.     Heart sounds: Normal heart sounds. No murmur heard. Pulmonary:     Effort: Pulmonary effort is normal.     Breath sounds: Normal breath sounds. No wheezing or rales.  Chest:     Chest wall: No tenderness.  Abdominal:     General: Bowel sounds are normal. There is no distension.     Palpations: Abdomen is soft. There is no mass.     Tenderness: There is no abdominal tenderness.  Musculoskeletal:        General: Normal range of motion.     Right lower leg: No edema.     Left lower leg: No edema.  Neurological:     Mental Status: She is alert and oriented to person, place, and time.  Psychiatric:        Mood and Affect: Mood normal.        Latest Ref Rng & Units 01/02/2022    8:47 AM 04/24/2021   10:37 AM 10/18/2020    9:57 AM  CMP  Glucose 70 - 99 mg/dL 126  119  105   BUN 6 - 24 mg/dL '18  16  22   '$ Creatinine 0.57 - 1.00 mg/dL 0.71  0.72  0.64   Sodium 134 - 144 mmol/L 140  142  141   Potassium 3.5 - 5.2 mmol/L 4.2  4.3  4.0   Chloride 96 - 106 mmol/L 100  102  98   CO2 20 - 29 mmol/L '23  22  24   '$ Calcium 8.7 - 10.2 mg/dL 9.9  9.6  9.9   Total Protein 6.0 - 8.5 g/dL 7.1   7.1   Total Bilirubin 0.0 - 1.2 mg/dL 0.6   0.5    Alkaline Phos 44 - 121 IU/L 54   51   AST 0 - 40 IU/L 23   32   ALT 0 - 32 IU/L 24   29     Lipid Panel     Component Value Date/Time   CHOL 157 01/02/2022 0847   TRIG 109 01/02/2022 0847   HDL 52 01/02/2022 0847   CHOLHDL 3.0 10/18/2020 0957   CHOLHDL 6.7 (H) 06/04/2015 0925   VLDL 67 (H) 06/04/2015 0925   LDLCALC 85 01/02/2022 0847    CBC    Component Value Date/Time   WBC 6.4 11/17/2016 0852   WBC 7.5 03/12/2016 1122   RBC 4.96 11/17/2016 0852   RBC 4.77 03/12/2016 1122   HGB 13.7 11/17/2016 0852   HCT 42.8 11/17/2016 0852   PLT 237 11/17/2016 0852   MCV 86 11/17/2016 0852   MCH 27.6 11/17/2016 0852   MCH 28.7 03/12/2016 1122   MCHC 32.0 11/17/2016 0852   MCHC 33.1 03/12/2016 1122   RDW 13.1 11/17/2016 0852   LYMPHSABS 2.7 11/17/2016 0852   MONOABS 0.4 03/12/2016 1122   EOSABS 0.2 11/17/2016 0852   BASOSABS 0.0 11/17/2016 0852    Lab Results  Component Value Date   HGBA1C 8.5 (A) 07/06/2022    Assessment & Plan:  1. Type 2 diabetes mellitus with  other neurologic complication, without long-term current use of insulin (HCC) Controlled with A1c of 8.5 which has trended up from 7.6 previously; goal is less than 7.0 She discontinued Farxiga due to dizziness Will increase glipizide from 5 mg to 10 mg twice daily and advised that if hypoglycemia occurs she needs to notify me.  Continue metformin Neuropathy has improved slightly.  Inability to tolerate gabapentin and Cymbalta Counseled on Diabetic diet, my plate method, 631 minutes of moderate intensity exercise/week Blood sugar logs with fasting goals of 80-120 mg/dl, random of less than 180 and in the event of sugars less than 60 mg/dl or greater than 400 mg/dl encouraged to notify the clinic. Advised on the need for annual eye exams, annual foot exams, Pneumonia vaccine. - POCT glucose (manual entry) - POCT glycosylated hemoglobin (Hb A1C) - glipiZIDE (GLUCOTROL) 10 MG tablet; Take 1 tablet (10 mg total) by  mouth 2 (two) times daily before a meal.  Dispense: 180 tablet; Refill: 1 - Ambulatory referral to Ophthalmology - AMB Referral to Pharmacy Medication Management  2. Hypertension complicating diabetes ([place]) Controlled Counseled on blood pressure goal of less than 130/80, low-sodium, DASH diet, medication compliance, 150 minutes of moderate intensity exercise per week. Discussed medication compliance, adverse effects. - CMP14+EGFR - hydrochlorothiazide (HYDRODIURIL) 25 MG tablet; Take 1 tablet (25 mg total) by mouth daily.  Dispense: 90 tablet; Refill: 1 - lisinopril (ZESTRIL) 5 MG tablet; 1 tab by mouth daily with HCTZ  Dispense: 90 tablet; Refill: 1  3. Hyperlipidemia associated with type 2 diabetes mellitus (HCC) Controlled Low-cholesterol diet - atorvastatin (LIPITOR) 40 MG tablet; Take 1 tablet (40 mg total) by mouth daily.  Dispense: 90 tablet; Refill: 1 - metFORMIN (GLUCOPHAGE) 1000 MG tablet; TAKE 1 TABLET BY MOUTH TWICE DAILY WITH A MEAL  Dispense: 180 tablet; Refill: 1  4. Gastroesophageal reflux disease without esophagitis Controlled - pantoprazole (PROTONIX) 40 MG tablet; Take 1 tablet (40 mg total) by mouth daily.  Dispense: 90 tablet; Refill: 1  5. Encounter for screening mammogram for malignant neoplasm of breast - MM 3D SCREEN BREAST BILATERAL; Future  6. Fracture of denture - Ambulatory referral to Dentistry     Meds ordered this encounter  Medications   glipiZIDE (GLUCOTROL) 10 MG tablet    Sig: Take 1 tablet (10 mg total) by mouth 2 (two) times daily before a meal.    Dispense:  180 tablet    Refill:  1    Dose increase   atorvastatin (LIPITOR) 40 MG tablet    Sig: Take 1 tablet (40 mg total) by mouth daily.    Dispense:  90 tablet    Refill:  1   hydrochlorothiazide (HYDRODIURIL) 25 MG tablet    Sig: Take 1 tablet (25 mg total) by mouth daily.    Dispense:  90 tablet    Refill:  1   lisinopril (ZESTRIL) 5 MG tablet    Sig: 1 tab by mouth daily with  HCTZ    Dispense:  90 tablet    Refill:  1   metFORMIN (GLUCOPHAGE) 1000 MG tablet    Sig: TAKE 1 TABLET BY MOUTH TWICE DAILY WITH A MEAL    Dispense:  180 tablet    Refill:  1   pantoprazole (PROTONIX) 40 MG tablet    Sig: Take 1 tablet (40 mg total) by mouth daily.    Dispense:  90 tablet    Refill:  1    Follow-up: Return in about 3 months (around 10/05/2022) for  Chronic medical conditions.       Charlott Rakes, MD, FAAFP. [facility] [place], [place]   07/06/2022, 5:25 PM

## 2022-07-07 LAB — CMP14+EGFR
ALT: 31 IU/L (ref 0–32)
AST: 23 IU/L (ref 0–40)
Albumin/Globulin Ratio: 2.2 (ref 1.2–2.2)
Albumin: 5.2 g/dL — ABNORMAL HIGH (ref 3.8–4.9)
Alkaline Phosphatase: 65 IU/L (ref 44–121)
BUN/Creatinine Ratio: 17 (ref 12–28)
BUN: 12 mg/dL (ref 8–27)
Bilirubin Total: 0.5 mg/dL (ref 0.0–1.2)
CO2: 24 mmol/L (ref 20–29)
Calcium: 10.4 mg/dL — ABNORMAL HIGH (ref 8.7–10.3)
Chloride: 95 mmol/L — ABNORMAL LOW (ref 96–106)
Creatinine, Ser: 0.69 mg/dL (ref 0.57–1.00)
Globulin, Total: 2.4 g/dL (ref 1.5–4.5)
Glucose: 223 mg/dL — ABNORMAL HIGH (ref 70–99)
Potassium: 3.4 mmol/L — ABNORMAL LOW (ref 3.5–5.2)
Sodium: 138 mmol/L (ref 134–144)
Total Protein: 7.6 g/dL (ref 6.0–8.5)
eGFR: 99 mL/min/{1.73_m2} (ref >59)

## 2022-07-08 ENCOUNTER — Telehealth: Payer: Self-pay | Admitting: Pharmacist

## 2022-07-08 NOTE — Telephone Encounter (Signed)
Called patient to complete initial consent for the LIBERATE study. Pt was unable to complete the call as she was at work. Will call her tomorrow afternoon after [place], PharmD, [place], [place] [phone]

## 2022-10-22 ENCOUNTER — Ambulatory Visit: Payer: Self-pay | Admitting: *Deleted

## 2022-10-22 NOTE — Telephone Encounter (Signed)
Summary: High blood sugar + Abdominal discomfort   Pt called reporting that she has symptoms alongside having high blood sugar. Says she cannot sleep at night and also has abdominal discomfort not pain.  Best contact: [phone]        Interpreter ID  # [id number]  Chief Complaint: multiple c/o , elevated blood sugar 205 this am, upper abdominal pain, chest pain bilateral hands N/T, blurred vision and double vision  Symptoms: see above. Reports headache, jaw pain  Frequency: blood glucose this am. Chest pain jaw pain upper abdominal pain several years and N/T bilateral hands, double vision blurred vision x 1 week Pertinent Negatives: Patient denies severe chest pain now , no weakness on either side of body.  Disposition: [] ED /[] Urgent Care (no appt availability in office) / [] Appointment(In office/virtual)/ []  [facility]/ [] Home Care/ [x] Refused Recommended Disposition /[] [facility]/ []  Follow-up with PCP Additional Notes:   No available appt until 11/05/22. Recommended ED due to ongoing sx. Patient requesting GI referral. Please advise patient not going to ED for sx.         Reason for Disposition  [1] Numbness or tingling in one or both hands AND [2] is a chronic symptom (recurrent or ongoing AND present > 4 weeks)  [1] Patient says chest pain feels exactly the same as previously diagnosed "heartburn" AND [2] describes burning in chest AND [3] accompanying sour taste in mouth  Blood glucose 70-240 mg/dL (3.9 -14.7 mmol/L)  Answer Assessment - Initial Assessment Questions 1. BLOOD GLUCOSE: "What is your blood glucose level?"      This am 205 2. ONSET: "When did you check the blood glucose?"     This am fasting  3. USUAL RANGE: "What is your glucose level usually?" (e.g., usual fasting morning value, usual evening value)     120-135 4. KETONES: "Do you check for ketones (urine or blood test strips)?" If Yes, ask: "What does the test show now?"      na 5.  TYPE 1 or 2:  "Do you know what type of diabetes you have?"  (e.g., Type 1, Type 2, Gestational; doesn't know)      na 6. INSULIN: "Do you take insulin?" "What type of insulin(s) do you use? What is the mode of delivery? (syringe, pen; injection or pump)?"      na 7. DIABETES PILLS: "Do you take any pills for your diabetes?" If Yes, ask: "Have you missed taking any pills recently?"     Yes metformin and glipizide  has not missed doses 8. OTHER SYMPTOMS: "Do you have any symptoms?" (e.g., fever, frequent urination, difficulty breathing, dizziness, weakness, vomiting)     Headache dizziness at times. Vision "bad".  9. PREGNANCY: "Is there any chance you are pregnant?" "When was your last menstrual period?"     na  Answer Assessment - Initial Assessment Questions 1. LOCATION: "Where does it hurt?"       Chest  2. RADIATION: "Does the pain go anywhere else?" (e.g., into neck, jaw, arms, back)     jaw 3. ONSET: "When did the chest pain begin?" (Minutes, hours or days)      Several years ago  4. PATTERN: "Does the pain come and go, or has it been constant since it started?"  "Does it get worse with exertion?"      Comes and goes  5. DURATION: "How long does it last" (e.g., seconds, minutes, hours)     Several hours at times  6. SEVERITY: "How bad is the pain?"  (e.g., Scale 1-10; mild, moderate, or severe)    - MILD (1-3): doesn't interfere with normal activities     - MODERATE (4-7): interferes with normal activities or awakens from sleep    - SEVERE (8-10): excruciating pain, unable to do any normal activities       Intense at times with upper abdominal pain 7. CARDIAC RISK FACTORS: "Do you have any history of heart problems or risk factors for heart disease?" (e.g., angina, prior heart attack; diabetes, high blood pressure, high cholesterol, smoker, or strong family history of heart disease)     na 8. PULMONARY RISK FACTORS: "Do you have any history of lung disease?"  (e.g., blood clots in  lung, asthma, emphysema, birth control pills)     na 9. CAUSE: "What do you think is causing the chest pain?"     reflux 10. OTHER SYMPTOMS: "Do you have any other symptoms?" (e.g., dizziness, nausea, vomiting, sweating, fever, difficulty breathing, cough)       Dizziness , nausea , headaches, intense pain only upper abdomen  11. PREGNANCY: "Is there any chance you are pregnant?" "When was your last menstrual period?"       na  Answer Assessment - Initial Assessment Questions 1. SYMPTOM: "What is the main symptom you are concerned about?" (e.g., weakness, numbness)     Headache blurred vision double vision  2. ONSET: "When did this start?" (minutes, hours, days; while sleeping)     1 week ago  3. LAST NORMAL: "When was the last time you (the patient) were normal (no symptoms)?"     1 week ago  4. PATTERN "Does this come and go, or has it been constant since it started?"  "Is it present now?"     Na  5. CARDIAC SYMPTOMS: "Have you had any of the following symptoms: chest pain, difficulty breathing, palpitations?"     Chest pain , upper abdominal pain. Jaw pain.  6. NEUROLOGIC SYMPTOMS: "Have you had any of the following symptoms: headache, dizziness, vision loss, double vision, changes in speech, unsteady on your feet?"     Headache , blurred vision, double vision bilateral numbness hands elevated blood sugars  7. OTHER SYMPTOMS: "Do you have any other symptoms?"     Upper abominal  pain 8. PREGNANCY: "Is there any chance you are pregnant?" "When was your last menstrual period?"     na  Protocols used: Diabetes - High Blood Sugar-A-AH, Chest Pain-A-AH, Neurologic Deficit-A-AH

## 2022-10-22 NOTE — Telephone Encounter (Signed)
Attempted to contact patient to schedule appointment. VM left informing patient to return call.

## 2022-11-25 ENCOUNTER — Encounter: Payer: Self-pay | Admitting: Family Medicine

## 2022-11-25 ENCOUNTER — Ambulatory Visit: Payer: 59 | Attending: Family Medicine | Admitting: Family Medicine

## 2022-11-25 VITALS — BP 117/76 | HR 100 | Temp 98.1°F | Ht 62.0 in | Wt 146.8 lb

## 2022-11-25 DIAGNOSIS — E1149 Type 2 diabetes mellitus with other diabetic neurological complication: Secondary | ICD-10-CM | POA: Diagnosis not present

## 2022-11-25 DIAGNOSIS — E1159 Type 2 diabetes mellitus with other circulatory complications: Secondary | ICD-10-CM | POA: Diagnosis not present

## 2022-11-25 DIAGNOSIS — E785 Hyperlipidemia, unspecified: Secondary | ICD-10-CM

## 2022-11-25 DIAGNOSIS — I152 Hypertension secondary to endocrine disorders: Secondary | ICD-10-CM

## 2022-11-25 DIAGNOSIS — E1169 Type 2 diabetes mellitus with other specified complication: Secondary | ICD-10-CM | POA: Diagnosis not present

## 2022-11-25 DIAGNOSIS — Z7984 Long term (current) use of oral hypoglycemic drugs: Secondary | ICD-10-CM

## 2022-11-25 DIAGNOSIS — K219 Gastro-esophageal reflux disease without esophagitis: Secondary | ICD-10-CM

## 2022-11-25 DIAGNOSIS — R109 Unspecified abdominal pain: Secondary | ICD-10-CM

## 2022-11-25 DIAGNOSIS — Z1509 Genetic susceptibility to other malignant neoplasm: Secondary | ICD-10-CM

## 2022-11-25 LAB — POCT GLYCOSYLATED HEMOGLOBIN (HGB A1C): HbA1c, POC (controlled diabetic range): 10.4 % — AB (ref 0.0–7.0)

## 2022-11-25 MED ORDER — INSULIN PEN NEEDLE 31G X 5 MM MISC
1.0000 | Freq: Every day | 5 refills | Status: DC
Start: 2022-11-25 — End: 2023-07-01

## 2022-11-25 MED ORDER — ATORVASTATIN CALCIUM 40 MG PO TABS: 40.0000 mg | ORAL_TABLET | Freq: Every day | ORAL | 1 refills | Status: DC

## 2022-11-25 MED ORDER — ONDANSETRON HCL 4 MG PO TABS: 4.0000 mg | ORAL_TABLET | Freq: Three times a day (TID) | ORAL | 0 refills | Status: DC | PRN

## 2022-11-25 MED ORDER — HYDROCHLOROTHIAZIDE 25 MG PO TABS: 25.0000 mg | ORAL_TABLET | Freq: Every day | ORAL | 1 refills | Status: DC

## 2022-11-25 MED ORDER — LANTUS SOLOSTAR 100 UNIT/ML ~~LOC~~ SOPN
8.0000 [IU] | PEN_INJECTOR | Freq: Every day | SUBCUTANEOUS | 6 refills | Status: DC
Start: 2022-11-25 — End: 2023-07-01

## 2022-11-25 MED ORDER — GLIPIZIDE 10 MG PO TABS: 10.0000 mg | ORAL_TABLET | Freq: Two times a day (BID) | ORAL | 1 refills | Status: DC

## 2022-11-25 MED ORDER — METFORMIN HCL 1000 MG PO TABS: ORAL_TABLET | ORAL | 1 refills | Status: DC

## 2022-11-25 MED ORDER — PANTOPRAZOLE SODIUM 40 MG PO TBEC: 40.0000 mg | DELAYED_RELEASE_TABLET | Freq: Every day | ORAL | 1 refills | Status: DC

## 2022-11-25 MED ORDER — FAMOTIDINE 20 MG PO TABS: 20.0000 mg | ORAL_TABLET | Freq: Every day | ORAL | 1 refills | Status: DC

## 2022-11-25 MED ORDER — LISINOPRIL 5 MG PO TABS: ORAL_TABLET | ORAL | 1 refills | Status: DC

## 2022-11-25 NOTE — Patient Instructions (Signed)
[phone] [facility]

## 2022-11-25 NOTE — Progress Notes (Signed)
Subjective:  Patient ID: Laura Douglas, female    DOB: 1961/04/11  Age: 62 y.o. MRN: [id number]  CC: Diabetes   HPI Laura Douglas is a 62 y.o. year old female with a history of Lynch syndrome, colon cancer in 2009 (status post resection), endometrial cancer (status post TAHBSO LND in 08/2014 with pathology revealing stage Ia grade 1 endometrioid endometrial cancer with no lymphovascular space invasion and negative lymph nodes) type 2 diabetes mellitus (A1c 10.4).   Interval History:   She Complains of abdominal pain but then on further questioning she describes it as nausea early in the morning and she wakes up with this 'uncomfortable feeling'. Symptoms are absent during the day. She endorses adherence with Protonix and Pepcid. She has not had a recent GI evaluation for follow up of her Lynch syndrome.  Her fasting sugars have been elevated to about 200 and random sugars 300-400s. A1c is 10.4.  Endorses adherence with her oral hypoglycemic agents.  She has no neuropathy, visual concerns or hypoglycemic symptoms. Doing well on her antihypertensive and her statin. Past Medical History:  Diagnosis Date   Colon cancer [facility])    possible? Patient had surgery in [place]. Denies needing adjuvant therapy. The surgery "removed a tumor from the colon".   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    Past Surgical History:  Procedure Laterality Date   ABDOMINAL HYSTERECTOMY     abortions     twice    arm surgery     left arm   COLON SURGERY     COLON SURGERY  2010   tumor removal   COLONOSCOPY  02/17/2018   Mansouraty ta x 2, hems   FRACTURE SURGERY  2006   Lt lower arm   MULTIPLE TOOTH EXTRACTIONS     ROBOTIC ASSISTED TOTAL HYSTERECTOMY WITH BILATERAL SALPINGO  OOPHERECTOMY Bilateral 09/11/2014   Procedure: ROBOTIC ASSISTED TOTAL LAPAROSCOPIC HYSTERECTOMY WITH BILATERAL SALPINGO OOPHORECTOMY LYMPH NODE DISSECTION; LYSIS OF ADHESIONS;  Surgeon: Laurette Schimke, MD;  Location: [facility];  Service: Gynecology;  Laterality: Bilateral;   TUBAL LIGATION  1993   UPPER GASTROINTESTINAL ENDOSCOPY      Family History  Problem Relation Age of Onset   Prostate cancer Father    Diabetes Mother    Uterine cancer Sister 75   Cancer Sister        colon cancer as well age 2?   Colon cancer Sister    Uterine cancer Maternal Aunt    Alcohol abuse Brother    Cirrhosis Brother        cause of death   Breast cancer Neg Hx    Rectal cancer Neg Hx    Stomach cancer Neg Hx     Social History   Socioeconomic History   Marital status: Married    Spouse name: Not on file   Number of children: 4   Years of education: Not on file   Highest education level: Not on file  Occupational History   Occupation: Factory work.  Tobacco Use   Smoking status: Never   Smokeless tobacco: Never  Vaping Use   Vaping Use: Never used  Substance and Sexual Activity   Alcohol use: No   Drug  use: No   Sexual activity: Yes    Birth control/protection: Surgical    Comment: Hysterectomy  Other Topics Concern   Not on file  Social History Narrative   Lives at home with her female partner   Only son killed by gang violence in [place].   Social Determinants of Health   Financial Resource Strain: Not on file  Food Insecurity: Not on file  Transportation Needs: Not on file  Physical Activity: Inactive (07/01/2017)   Exercise Vital Sign    Days of Exercise per Week: 0 days    Minutes of Exercise per Session: 0 min  Stress: No Stress Concern Present (07/01/2017)   [employer] of Occupational Health - Occupational Stress Questionnaire    Feeling of Stress : Only a little  Social Connections: Somewhat Isolated (07/01/2017)   Social Connection and Isolation Panel [NHANES]     Frequency of Communication with Friends and Family: More than three times a week    Frequency of Social Gatherings with Friends and Family: Three times a week    Attends Religious Services: Never    Active Member of Clubs or Organizations: No    Attends Banker Meetings: Never    Marital Status: Living with partner    No Known Allergies  Outpatient Medications Prior to Visit  Medication Sig Dispense Refill   Blood Glucose Monitoring Suppl (AGAMATRIX PRESTO) w/Device KIT Check sugars before meals twice sugars 1 kit 0   glucose blood (AGAMATRIX PRESTO TEST) test strip Check sugars twice daily before meals 100 each 12   Simethicone 80 MG TABS Take 1 tablet (80 mg total) by mouth in the morning, at noon, and at bedtime. 60 tablet 2   atorvastatin (LIPITOR) 40 MG tablet Take 1 tablet (40 mg total) by mouth daily. 90 tablet 1   famotidine (PEPCID) 20 MG tablet Take 1 tablet (20 mg total) by mouth at bedtime. 30 tablet 3   glipiZIDE (GLUCOTROL) 10 MG tablet Take 1 tablet (10 mg total) by mouth 2 (two) times daily before a meal. 180 tablet 1   hydrochlorothiazide (HYDRODIURIL) 25 MG tablet Take 1 tablet (25 mg total) by mouth daily. 90 tablet 1   lisinopril (ZESTRIL) 5 MG tablet 1 tab by mouth daily with HCTZ 90 tablet 1   metFORMIN (GLUCOPHAGE) 1000 MG tablet TAKE 1 TABLET BY MOUTH TWICE DAILY WITH A MEAL 180 tablet 1   pantoprazole (PROTONIX) 40 MG tablet Take 1 tablet (40 mg total) by mouth daily. 90 tablet 1   No facility-administered medications prior to visit.     ROS Review of Systems  Constitutional:  Negative for activity change and appetite change.  HENT:  Negative for sinus pressure and sore throat.   Respiratory:  Negative for chest tightness, shortness of breath and wheezing.   Cardiovascular:  Negative for chest pain and palpitations.  Gastrointestinal:  Positive for nausea. Negative for abdominal distention, abdominal pain and constipation.  Genitourinary:  Negative.   Musculoskeletal: Negative.   Psychiatric/Behavioral:  Negative for behavioral problems and dysphoric mood.     Objective:  BP 117/76   Pulse 100   Temp 98.1 F (36.7 C) (Oral)   Ht 5\' 2"  (1.575 m)   Wt 146 lb 12.8 oz (66.6 kg)   LMP  (LMP Unknown)   SpO2 96%   BMI 26.85 kg/m      11/25/2022    2:05 PM 07/06/2022    3:00 PM 12/30/2021    2:38 PM  BP/Weight  Systolic BP 117 120 110  Diastolic BP 76 79 72  Wt. (Lbs) 146.8 145.8 145.2  BMI 26.85 kg/m2 26.67 kg/m2 26.56 kg/m2      Physical Exam Constitutional:      Appearance: She is well-developed.  Cardiovascular:     Rate and Rhythm: Normal rate.     Heart sounds: Normal heart sounds. No murmur heard. Pulmonary:     Effort: Pulmonary effort is normal.     Breath sounds: Normal breath sounds. No wheezing or rales.  Chest:     Chest wall: No tenderness.  Abdominal:     General: Bowel sounds are normal. There is no distension.     Palpations: Abdomen is soft. There is no mass.     Tenderness: There is no abdominal tenderness.  Musculoskeletal:        General: Normal range of motion.     Right lower leg: No edema.     Left lower leg: No edema.  Neurological:     Mental Status: She is alert and oriented to person, place, and time.  Psychiatric:        Mood and Affect: Mood normal.        Latest Ref Rng & Units 07/06/2022    3:40 PM 01/02/2022    8:47 AM 04/24/2021   10:37 AM  CMP  Glucose 70 - 99 mg/dL 846  962  952   BUN 8 - 27 mg/dL 12  18  16    Creatinine 0.57 - 1.00 mg/dL 8.41  3.24  4.01   Sodium 134 - 144 mmol/L 138  140  142   Potassium 3.5 - 5.2 mmol/L 3.4  4.2  4.3   Chloride 96 - 106 mmol/L 95  100  102   CO2 20 - 29 mmol/L 24  23  22    Calcium 8.7 - 10.3 mg/dL 02.7  9.9  9.6   Total Protein 6.0 - 8.5 g/dL 7.6  7.1    Total Bilirubin 0.0 - 1.2 mg/dL 0.5  0.6    Alkaline Phos 44 - 121 IU/L 65  54    AST 0 - 40 IU/L 23  23    ALT 0 - 32 IU/L 31  24      Lipid Panel     Component  Value Date/Time   CHOL 157 01/02/2022 0847   TRIG 109 01/02/2022 0847   HDL 52 01/02/2022 0847   CHOLHDL 3.0 10/18/2020 0957   CHOLHDL 6.7 (H) 06/04/2015 0925   VLDL 67 (H) 06/04/2015 0925   LDLCALC 85 01/02/2022 0847    CBC    Component Value Date/Time   WBC 6.4 11/17/2016 0852   WBC 7.5 03/12/2016 1122   RBC 4.96 11/17/2016 0852   RBC 4.77 03/12/2016 1122   HGB 13.7 11/17/2016 0852   HCT 42.8 11/17/2016 0852   PLT 237 11/17/2016 0852   MCV 86 11/17/2016 0852   MCH 27.6 11/17/2016 0852   MCH 28.7 03/12/2016 1122   MCHC 32.0 11/17/2016 0852   MCHC 33.1 03/12/2016 1122   RDW 13.1 11/17/2016 0852   LYMPHSABS 2.7 11/17/2016 0852   MONOABS 0.4 03/12/2016 1122   EOSABS 0.2 11/17/2016 0852   BASOSABS 0.0 11/17/2016 0852    Lab Results  Component Value Date   HGBA1C 10.4 (A) 11/25/2022    Assessment & Plan:  1. Type 2 diabetes mellitus with other neurologic complication, without long-term current use of insulin (HCC) Uncontrolled with A1c of 10.4, goal is less than  7.0 Placed on Lantus Counseled on Diabetic diet, my plate method, 161 minutes of moderate intensity exercise/week Blood sugar logs with fasting goals of 80-120 mg/dl, random of less than 096 and in the event of sugars less than 60 mg/dl or greater than 045 mg/dl encouraged to notify the clinic. Advised on the need for annual eye exams, annual foot exams, Pneumonia vaccine. - POCT glycosylated hemoglobin (Hb A1C) - glipiZIDE (GLUCOTROL) 10 MG tablet; Take 1 tablet (10 mg total) by mouth 2 (two) times daily before a meal.  Dispense: 180 tablet; Refill: 1 - metFORMIN (GLUCOPHAGE) 1000 MG tablet; TAKE 1 TABLET BY MOUTH TWICE DAILY WITH A MEAL  Dispense: 180 tablet; Refill: 1 - Insulin Pen Needle 31G X 5 MM MISC; 1 each by Does not apply route at bedtime.  Dispense: 30 each; Refill: 5 - CMP14+EGFR  2. Hyperlipidemia associated with type 2 diabetes mellitus (HCC) Controlled Low-cholesterol diet - atorvastatin  (LIPITOR) 40 MG tablet; Take 1 tablet (40 mg total) by mouth daily.  Dispense: 90 tablet; Refill: 1 - insulin glargine (LANTUS SOLOSTAR) 100 UNIT/ML Solostar Pen; Inject 8 Units into the skin at bedtime.  Dispense: 15 mL; Refill: 6  3. Gastroesophageal reflux disease without esophagitis Stable - Ambulatory referral to Gastroenterology - famotidine (PEPCID) 20 MG tablet; Take 1 tablet (20 mg total) by mouth at bedtime.  Dispense: 90 tablet; Refill: 1 - pantoprazole (PROTONIX) 40 MG tablet; Take 1 tablet (40 mg total) by mouth daily.  Dispense: 90 tablet; Refill: 1  4. Hypertension complicating diabetes (HCC) Controlled Low-cholesterol diet - hydrochlorothiazide (HYDRODIURIL) 25 MG tablet; Take 1 tablet (25 mg total) by mouth daily.  Dispense: 90 tablet; Refill: 1 - lisinopril (ZESTRIL) 5 MG tablet; 1 tab by mouth daily with HCTZ  Dispense: 90 tablet; Refill: 1  5. Lynch syndrome She is due for surveillance colonoscopy - Ambulatory referral to Gastroenterology  6. Abdominal discomfort Ongoing nausea with underlying GERD Uncontrolled on PPI - Ambulatory referral to Gastroenterology    Meds ordered this encounter  Medications   atorvastatin (LIPITOR) 40 MG tablet    Sig: Take 1 tablet (40 mg total) by mouth daily.    Dispense:  90 tablet    Refill:  1   famotidine (PEPCID) 20 MG tablet    Sig: Take 1 tablet (20 mg total) by mouth at bedtime.    Dispense:  90 tablet    Refill:  1   glipiZIDE (GLUCOTROL) 10 MG tablet    Sig: Take 1 tablet (10 mg total) by mouth 2 (two) times daily before a meal.    Dispense:  180 tablet    Refill:  1   hydrochlorothiazide (HYDRODIURIL) 25 MG tablet    Sig: Take 1 tablet (25 mg total) by mouth daily.    Dispense:  90 tablet    Refill:  1   lisinopril (ZESTRIL) 5 MG tablet    Sig: 1 tab by mouth daily with HCTZ    Dispense:  90 tablet    Refill:  1   metFORMIN (GLUCOPHAGE) 1000 MG tablet    Sig: TAKE 1 TABLET BY MOUTH TWICE DAILY WITH A MEAL     Dispense:  180 tablet    Refill:  1   pantoprazole (PROTONIX) 40 MG tablet    Sig: Take 1 tablet (40 mg total) by mouth daily.    Dispense:  90 tablet    Refill:  1   insulin glargine (LANTUS SOLOSTAR) 100 UNIT/ML Solostar Pen  Sig: Inject 8 Units into the skin at bedtime.    Dispense:  15 mL    Refill:  6   Insulin Pen Needle 31G X 5 MM MISC    Sig: 1 each by Does not apply route at bedtime.    Dispense:  30 each    Refill:  5   ondansetron (ZOFRAN) 4 MG tablet    Sig: Take 1 tablet (4 mg total) by mouth every 8 (eight) hours as needed for nausea or vomiting.    Dispense:  20 tablet    Refill:  0    Follow-up: Return in about 3 months (around 02/25/2023) for Chronic medical conditions.       Hoy Register, MD, FAAFP. [facility] [place], [place] [phone]   11/25/2022, 5:26 PM

## 2022-11-25 NOTE — Progress Notes (Signed)
Upper abdominal pain in the AM Elevated blood sugars.

## 2022-11-26 LAB — CMP14+EGFR
ALT: 33 IU/L — ABNORMAL HIGH (ref 0–32)
AST: 25 IU/L (ref 0–40)
Albumin/Globulin Ratio: 2
Albumin: 4.6 g/dL (ref 3.8–4.9)
Alkaline Phosphatase: 65 IU/L (ref 44–121)
BUN/Creatinine Ratio: 23 (ref 12–28)
BUN: 15 mg/dL (ref 8–27)
Bilirubin Total: 0.5 mg/dL (ref 0.0–1.2)
CO2: 26 mmol/L (ref 20–29)
Calcium: 10.2 mg/dL (ref 8.7–10.3)
Chloride: 99 mmol/L (ref 96–106)
Creatinine, Ser: 0.64 mg/dL (ref 0.57–1.00)
Globulin, Total: 2.3 g/dL (ref 1.5–4.5)
Glucose: 192 mg/dL — ABNORMAL HIGH (ref 70–99)
Potassium: 4.1 mmol/L (ref 3.5–5.2)
Sodium: 140 mmol/L (ref 134–144)
Total Protein: 6.9 g/dL (ref 6.0–8.5)
eGFR: 101 mL/min/{1.73_m2} (ref >59)

## 2022-11-27 ENCOUNTER — Other Ambulatory Visit: Payer: Self-pay

## 2022-12-01 ENCOUNTER — Encounter: Payer: Self-pay | Admitting: Gastroenterology

## 2022-12-23 ENCOUNTER — Telehealth: Payer: Self-pay | Admitting: *Deleted

## 2022-12-23 ENCOUNTER — Ambulatory Visit (AMBULATORY_SURGERY_CENTER): Payer: 59 | Admitting: *Deleted

## 2022-12-23 VITALS — Ht 62.0 in | Wt 146.4 lb

## 2022-12-23 DIAGNOSIS — Z85038 Personal history of other malignant neoplasm of large intestine: Secondary | ICD-10-CM

## 2022-12-23 DIAGNOSIS — Z1509 Genetic susceptibility to other malignant neoplasm: Secondary | ICD-10-CM

## 2022-12-23 MED ORDER — NA SULFATE-K SULFATE-MG SULF 17.5-3.13-1.6 GM/177ML PO SOLN
1.0000 | Freq: Once | ORAL | 0 refills | Status: AC
Start: 2022-12-23 — End: 2022-12-23

## 2022-12-23 NOTE — Progress Notes (Signed)
Interpreter used today at the [facility] for this pt.  Interpreter's name is- Nettie Elm   No egg or soy allergy known to patient  No issues known to pt with past sedation with any surgeries or procedures Patient denies ever being told they had issues or difficulty with intubation  No FH of Malignant Hyperthermia Pt is not on diet pills Pt is not on  home 02  Pt is not on blood thinners  Pt denies issues with constipation  Pt is not on dialysis Pt denies any upcoming cardiac testing Pt encouraged to use to use Singlecare or Goodrx to reduce cost  Patient's chart reviewed by Cathlyn Parsons CNRA prior to previsit and patient appropriate for the [facility].  Previsit completed and red dot placed by patient's name on their procedure day (on provider's schedule).

## 2022-12-23 NOTE — Telephone Encounter (Signed)
From staff message to Dr. Meridee Score  Mansouraty, Netty Starring., MD  Inocente Salles, RN OK to add it on. GM       Previous Messages    ----- Message ----- From: Inocente Salles, RN Sent: 12/23/2022  12:38 PM EDT To: Lemar Lofty., MD  Dr. Meridee Score,  I am seeing this pt at [facility].  She is supposed is be an egd/colon but was accidentally scheduled for only a colonoscopy.  She has a hx of Lynch syndrome.and colon CA.  You are full on 01-15-23, her procedure.  Could I double book her since it was our error?  Thanks, [employer]

## 2023-01-06 ENCOUNTER — Encounter: Payer: Self-pay | Admitting: Gastroenterology

## 2023-01-15 ENCOUNTER — Encounter: Payer: Self-pay | Admitting: Gastroenterology

## 2023-01-15 ENCOUNTER — Ambulatory Visit (AMBULATORY_SURGERY_CENTER): Payer: 59 | Admitting: Gastroenterology

## 2023-01-15 VITALS — BP 119/74 | HR 103 | Temp 98.4°F | Resp 11 | Ht 62.0 in | Wt 146.4 lb

## 2023-01-15 DIAGNOSIS — R1031 Right lower quadrant pain: Secondary | ICD-10-CM | POA: Diagnosis not present

## 2023-01-15 DIAGNOSIS — R12 Heartburn: Secondary | ICD-10-CM | POA: Diagnosis not present

## 2023-01-15 DIAGNOSIS — K219 Gastro-esophageal reflux disease without esophagitis: Secondary | ICD-10-CM

## 2023-01-15 DIAGNOSIS — D125 Benign neoplasm of sigmoid colon: Secondary | ICD-10-CM

## 2023-01-15 DIAGNOSIS — D124 Benign neoplasm of descending colon: Secondary | ICD-10-CM

## 2023-01-15 DIAGNOSIS — Z85038 Personal history of other malignant neoplasm of large intestine: Secondary | ICD-10-CM | POA: Diagnosis not present

## 2023-01-15 DIAGNOSIS — Z08 Encounter for follow-up examination after completed treatment for malignant neoplasm: Secondary | ICD-10-CM | POA: Diagnosis present

## 2023-01-15 DIAGNOSIS — Z1509 Genetic susceptibility to other malignant neoplasm: Secondary | ICD-10-CM

## 2023-01-15 MED ORDER — PANTOPRAZOLE SODIUM 40 MG PO TBEC
40.0000 mg | DELAYED_RELEASE_TABLET | Freq: Two times a day (BID) | ORAL | 3 refills | Status: DC
Start: 2023-01-15 — End: 2023-07-22

## 2023-01-15 MED ORDER — DICYCLOMINE HCL 20 MG PO TABS: 20.0000 mg | ORAL_TABLET | Freq: Three times a day (TID) | ORAL | 0 refills | Status: DC | PRN

## 2023-01-15 MED ORDER — SODIUM CHLORIDE 0.9 % IV SOLN
500.0000 mL | Freq: Once | INTRAVENOUS | Status: DC
Start: 2023-01-15 — End: 2023-01-15

## 2023-01-15 NOTE — Patient Instructions (Addendum)
-High fiber diet. - Use FiberCon 1-2 tablets PO daily. - Continue present medications. - Await pathology results - Repeat colonoscopy in 1 year for surveillance no matter pathology due to Lynch Syndrome.  - Recommend 2-week trial of Bentyl 20 mg 1-3 daily around time of significant abdominal cramping/pain. Do not have to use all of the  medication daily however if not necessary.  - Consider updated CT-AP for further evaluation of  pain/discomfort if it persists. - Increase PPI to 40 mg twice daily (take 30 minutes before a meal) for a month to see if symptoms improve. May also use Pepcid 20 mg right  before bedtime if helpful.      USTED TUVO UN PROCEDIMIENTO ENDOSCPICO HOY EN EL [facility]:   Lea el informe del procedimiento que se le entreg para cualquier pregunta especfica sobre lo que se Dentist.  Si el informe del examen no responde a sus preguntas, por favor llame a su gastroenterlogo para aclararlo.  Si usted solicit que no se le den [employer] de lo que se Clinical cytogeneticist en su procedimiento al [employer] va a cuidar, entonces el informe del procedimiento se ha incluido en un sobre sellado para que usted lo revise despus cuando le sea ms conveniente.   LO QUE PUEDE ESPERAR: Algunas sensaciones de hinchazn en el abdomen.  Puede tener ms gases de lo normal.  El caminar puede ayudarle a eliminar el aire que se le puso en el tracto gastrointestinal durante el procedimiento y reducir la hinchazn.  Si le hicieron una endoscopia inferior (como una colonoscopia o una sigmoidoscopia flexible), podra notar manchas de sangre en las heces fecales o en el papel higinico.  Si se someti a una preparacin intestinal para su procedimiento, es posible que no tenga una evacuacin intestinal normal durante [employer].   Tenga en cuenta:  Es posible que note un poco de irritacin y congestin en la nariz o algn drenaje.  Esto es debido al oxgeno [employer] durante su  procedimiento.  No hay que preocuparse y esto debe desaparecer ms o Regulatory affairs officer.   SNTOMAS PARA REPORTAR INMEDIATAMENTE:  Despus de una endoscopia inferior (colonoscopia o sigmoidoscopia flexible):  Cantidades excesivas de sangre en las heces fecales  Sensibilidad significativa o empeoramiento de los dolores abdominales   Hinchazn aguda del abdomen que antes no tena   Fiebre de 100F o ms   Despus de la endoscopia superior (EGD)  Vmitos de Retail buyer o material como caf molido   Dolor en el pecho o dolor debajo de los omplatos que antes no tena   Dolor o dificultad persistente para tragar  Falta de aire que antes no tena   Fiebre de 100F o ms  Heces fecales negras y pegajosas   Para asuntos urgentes o de Associate Professor, puede comunicarse con un gastroenterlogo a cualquier hora llamando al [phone].  DIETA:  Recomendamos una comida pequea al principio, pero luego puede continuar con su dieta normal.  Tome muchos lquidos, Tax adviser las bebidas alcohlicas durante 24 horas.    ACTIVIDAD:  Debe planear tomarse las cosas con calma por el resto del da y no debe CONDUCIR ni usar maquinaria pesada Patent examiner (debido a los medicamentos de sedacin utilizados durante el examen).     SEGUIMIENTO: Nuestro personal llamar al nmero que aparece en su historial al siguiente da hbil de su procedimiento para ver cmo se siente y para responder cualquier pregunta o inquietud que pueda Warehouse manager  con respecto a la informacin que se le dio despus del procedimiento. Si no podemos contactarle, le dejaremos un mensaje.  Sin embargo, si se siente bien y no tiene English as a second language teacher, no es necesario que nos devuelva la llamada.  Asumiremos que ha regresado a sus actividades diarias normales sin incidentes. Si se le tomaron algunas biopsias, le contactaremos por telfono o por carta en las prximas 3 semanas.  Si no ha sabido [employer] biopsias en el transcurso de 3 semanas, por favor  llmenos al [phone].   FIRMAS/CONFIDENCIALIDAD: Usted y/o el acompaante que le cuide han firmado documentos que se ingresarn en su historial mdico electrnico.  Estas firmas atestiguan el hecho de que la informacin anterior

## 2023-01-15 NOTE — Progress Notes (Signed)
Called to room to assist during endoscopic procedure.  Patient ID and intended procedure confirmed with present staff. Received instructions for my participation in the procedure from the performing physician.  

## 2023-01-15 NOTE — Progress Notes (Signed)
GASTROENTEROLOGY PROCEDURE H&P NOTE   Primary Care Physician: Hoy Register, MD  HPI: Laura Douglas is a 62 y.o. female who presents for EGD/Colonoscopy for underlying Lynch Syndrome, history of GERD, RLQ abdominal discomfort.  Past Medical History:  Diagnosis Date   Blood transfusion without reported diagnosis    Colon cancer [facility])    possible? Patient had surgery in [place]. Denies needing adjuvant therapy. The surgery "removed a tumor from the colon".   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   Past Surgical History:  Procedure Laterality Date   ABDOMINAL HYSTERECTOMY     abortions     twice    arm surgery     left arm   COLON SURGERY     COLON SURGERY  2010   tumor removal   COLONOSCOPY  02/17/2018   Mansouraty ta x 2, hems   FRACTURE SURGERY  2006   Lt lower arm   MULTIPLE TOOTH EXTRACTIONS     ROBOTIC ASSISTED TOTAL HYSTERECTOMY WITH BILATERAL SALPINGO OOPHERECTOMY Bilateral 09/11/2014   Procedure: ROBOTIC ASSISTED TOTAL LAPAROSCOPIC HYSTERECTOMY WITH BILATERAL SALPINGO OOPHORECTOMY LYMPH NODE DISSECTION; LYSIS OF ADHESIONS;  Surgeon: Laurette Schimke, MD;  Location: [facility];  Service: Gynecology;  Laterality: Bilateral;   TUBAL LIGATION  1993   UPPER GASTROINTESTINAL ENDOSCOPY     Current Outpatient Medications  Medication Sig Dispense Refill   atorvastatin (LIPITOR) 40 MG tablet Take 1 tablet (40 mg total) by mouth daily. 90 tablet 1   Blood Glucose Monitoring Suppl (AGAMATRIX PRESTO) w/Device KIT Check sugars before meals twice sugars 1 kit 0   glipiZIDE (GLUCOTROL) 10 MG tablet Take 1 tablet (10 mg total) by mouth 2 (two) times daily before a meal. 180 tablet 1   glucose blood (AGAMATRIX PRESTO TEST) test strip Check sugars twice daily before meals 100  each 12   hydrochlorothiazide (HYDRODIURIL) 25 MG tablet Take 1 tablet (25 mg total) by mouth daily. 90 tablet 1   insulin glargine (LANTUS SOLOSTAR) 100 UNIT/ML Solostar Pen Inject 8 Units into the skin at bedtime. 15 mL 6   Insulin Pen Needle 31G X 5 MM MISC 1 each by Does not apply route at bedtime. 30 each 5   lisinopril (ZESTRIL) 5 MG tablet 1 tab by mouth daily with HCTZ 90 tablet 1   metFORMIN (GLUCOPHAGE) 1000 MG tablet TAKE 1 TABLET BY MOUTH TWICE DAILY WITH A MEAL 180 tablet 1   pantoprazole (PROTONIX) 40 MG tablet Take 1 tablet (40 mg total) by mouth daily. 90 tablet 1   famotidine (PEPCID) 20 MG tablet Take 1 tablet (20 mg total) by mouth at bedtime. (Patient not taking: Reported on 12/23/2022) 90 tablet 1   ondansetron (ZOFRAN) 4 MG tablet Take 1 tablet (4 mg total) by mouth every 8 (eight) hours as needed for nausea or vomiting. (Patient not taking: Reported on 01/15/2023) 20 tablet 0   Simethicone 80 MG TABS Take 1 tablet (80 mg total) by mouth in the morning, at noon, and at bedtime. (Patient not taking: Reported on 12/23/2022) 60 tablet 2   Current Facility-Administered Medications  Medication Dose Route Frequency Provider Last Rate Last Admin   0.9 %  sodium chloride infusion  500 mL Intravenous  Once Mansouraty, Netty Starring., MD        Current Outpatient Medications:    atorvastatin (LIPITOR) 40 MG tablet, Take 1 tablet (40 mg total) by mouth daily., Disp: 90 tablet, Rfl: 1   Blood Glucose Monitoring Suppl (AGAMATRIX PRESTO) w/Device KIT, Check sugars before meals twice sugars, Disp: 1 kit, Rfl: 0   glipiZIDE (GLUCOTROL) 10 MG tablet, Take 1 tablet (10 mg total) by mouth 2 (two) times daily before a meal., Disp: 180 tablet, Rfl: 1   glucose blood (AGAMATRIX PRESTO TEST) test strip, Check sugars twice daily before meals, Disp: 100 each, Rfl: 12   hydrochlorothiazide (HYDRODIURIL) 25 MG tablet, Take 1 tablet (25 mg total) by mouth daily., Disp: 90 tablet, Rfl: 1   insulin glargine  (LANTUS SOLOSTAR) 100 UNIT/ML Solostar Pen, Inject 8 Units into the skin at bedtime., Disp: 15 mL, Rfl: 6   Insulin Pen Needle 31G X 5 MM MISC, 1 each by Does not apply route at bedtime., Disp: 30 each, Rfl: 5   lisinopril (ZESTRIL) 5 MG tablet, 1 tab by mouth daily with HCTZ, Disp: 90 tablet, Rfl: 1   metFORMIN (GLUCOPHAGE) 1000 MG tablet, TAKE 1 TABLET BY MOUTH TWICE DAILY WITH A MEAL, Disp: 180 tablet, Rfl: 1   pantoprazole (PROTONIX) 40 MG tablet, Take 1 tablet (40 mg total) by mouth daily., Disp: 90 tablet, Rfl: 1   famotidine (PEPCID) 20 MG tablet, Take 1 tablet (20 mg total) by mouth at bedtime. (Patient not taking: Reported on 12/23/2022), Disp: 90 tablet, Rfl: 1   ondansetron (ZOFRAN) 4 MG tablet, Take 1 tablet (4 mg total) by mouth every 8 (eight) hours as needed for nausea or vomiting. (Patient not taking: Reported on 01/15/2023), Disp: 20 tablet, Rfl: 0   Simethicone 80 MG TABS, Take 1 tablet (80 mg total) by mouth in the morning, at noon, and at bedtime. (Patient not taking: Reported on 12/23/2022), Disp: 60 tablet, Rfl: 2  Current Facility-Administered Medications:    0.9 %  sodium chloride infusion, 500 mL, Intravenous, Once, Mansouraty, Netty Starring., MD No Known Allergies Family History  Problem Relation Age of Onset   Diabetes Mother    Prostate cancer Father    Uterine cancer Sister 90   Cancer Sister        colon cancer as well age 28?   Colon cancer Sister    Alcohol abuse Brother    Cirrhosis Brother        cause of death   Uterine cancer Maternal Aunt    Breast cancer Neg Hx    Rectal cancer Neg Hx    Stomach cancer Neg Hx    Esophageal cancer Neg Hx    Social History   Socioeconomic History   Marital status: Married    Spouse name: Not on file   Number of children: 4   Years of education: Not on file   Highest education level: Not on file  Occupational History   Occupation: Factory work.  Tobacco Use   Smoking status: Never   Smokeless tobacco: Never   Vaping Use   Vaping status: Never Used  Substance and Sexual Activity   Alcohol use: No   Drug use: No   Sexual activity: Yes    Birth control/protection: Surgical    Comment: Hysterectomy  Other Topics Concern   Not on file  Social History Narrative   Lives at home with her female partner   Only son killed by gang violence in [place].   Social  Determinants of Health   Financial Resource Strain: Not on file  Food Insecurity: Not on file  Transportation Needs: Not on file  Physical Activity: Inactive (07/01/2017)   Exercise Vital Sign    Days of Exercise per Week: 0 days    Minutes of Exercise per Session: 0 min  Stress: No Stress Concern Present (07/01/2017)   [employer] of Occupational Health - Occupational Stress Questionnaire    Feeling of Stress : Only a little  Social Connections: Somewhat Isolated (07/01/2017)   Social Connection and Isolation Panel [NHANES]    Frequency of Communication with Friends and Family: More than three times a week    Frequency of Social Gatherings with Friends and Family: Three times a week    Attends Religious Services: Never    Active Member of Clubs or Organizations: No    Attends Banker Meetings: Never    Marital Status: Living with partner  Intimate Partner Violence: Not At Risk (07/01/2017)   Humiliation, Afraid, Rape, and Kick questionnaire    Fear of Current or Ex-Partner: No    Emotionally Abused: No    Physically Abused: No    Sexually Abused: No    Physical Exam: Today's Vitals   01/15/23 1300  BP: 133/79  Pulse: (!) 107  Temp: 98.4 F (36.9 C)  TempSrc: Skin  SpO2: 98%  Weight: 146 lb 6.4 oz (66.4 kg)  Height: 5\' 2"  (1.575 m)   Body mass index is 26.78 kg/m. GEN: NAD EYE: Sclerae anicteric ENT: MMM CV: Non-tachycardic GI: Soft, NT/ND NEURO:  Alert & Oriented x 3  Lab Results: No results for input(s): "WBC", "HGB", "HCT", "PLT" in the last 72 hours. BMET No results for input(s): "NA", "K",  "CL", "CO2", "GLUCOSE", "BUN", "CREATININE", "CALCIUM" in the last 72 hours. LFT No results for input(s): "PROT", "ALBUMIN", "AST", "ALT", "ALKPHOS", "BILITOT", "BILIDIR", "IBILI" in the last 72 hours. PT/INR No results for input(s): "LABPROT", "INR" in the last 72 hours.   Impression / Plan: This is a 62 y.o.female who presents for EGD/Colonoscopy for underlying Lynch Syndrome, history of GERD, RLQ abdominal discomfort.  The risks and benefits of endoscopic evaluation/treatment were discussed with the patient and/or family; these include but are not limited to the risk of perforation, infection, bleeding, missed lesions, lack of diagnosis, severe illness requiring hospitalization, as well as anesthesia and sedation related illnesses.  The patient's history has been reviewed, patient examined, no change in status, and deemed stable for procedure.  The patient and/or family is agreeable to proceed.    Corliss Parish, MD [facility] Office # [phone]

## 2023-01-15 NOTE — Progress Notes (Signed)
Sedate, gd SR, tolerated procedure well, VSS, report to RN 

## 2023-01-15 NOTE — Op Note (Signed)
[facility] Patient Name: Laura Douglas Procedure Date: 01/15/2023 1:00 PM MRN: [id number] Endoscopist: Corliss Parish , MD, [id number] Age: 63 Referring MD:  Date of Birth: [birth year] Gender: Female Account #: [id number] Procedure:                Colonoscopy Indications:              High risk colon cancer surveillance: Personal                            history of colon cancer, High risk colon cancer                            surveillance: Personal history of hereditary                            nonpolyposis colorectal cancer (Lynch Syndrome),                            Incidental - Abdominal pain in the right lower                            quadrant Medicines:                Monitored Anesthesia Care Procedure:                Pre-Anesthesia Assessment:                           - Prior to the procedure, a History and Physical                            was performed, and patient medications and                            allergies were reviewed. The patient's tolerance of                            previous anesthesia was also reviewed. The risks                            and benefits of the procedure and the sedation                            options and risks were discussed with the patient.                            All questions were answered, and informed consent                            was obtained. Prior Anticoagulants: The patient has                            taken no anticoagulant or antiplatelet agents. ASA  Grade Assessment: II - A patient with mild systemic                            disease. After reviewing the risks and benefits,                            the patient was deemed in satisfactory condition to                            undergo the procedure.                           After obtaining informed consent, the colonoscope                            was passed under direct vision. Throughout the                             procedure, the patient's blood pressure, pulse, and                            oxygen saturations were monitored continuously. The                            Olympus Scope [id number] was introduced through the                            anus and advanced to the the ileocolonic                            anastomosis. The colonoscopy was performed without                            difficulty. The patient tolerated the procedure.                            The quality of the bowel preparation was adequate.                            The ileocecal valve, appendiceal orifice, and                            rectum were photographed. Scope In: 1:41:15 PM Scope Out: 1:52:17 PM Scope Withdrawal Time: 0 hours 8 minutes 24 seconds  Total Procedure Duration: 0 hours 11 minutes 2 seconds  Findings:                 The digital rectal exam findings include                            hemorrhoids. Pertinent negatives include no                            palpable rectal lesions.  There was evidence of a prior end-to-end                            ileo-colonic anastomosis in the transverse colon.                            This was patent and was characterized by healthy                            appearing mucosa. There is evidence as prior of a                            fistulous tract between the colon and the                            neo-terminal ileum (unchanged from prior                            examinations).                           Three sessile polyps were found in the sigmoid                            colon (1) and descending colon (2). The polyps were                            2 to 6 mm in size. These polyps were removed with a                            cold snare. Resection and retrieval were complete.                           Normal mucosa was found in the entire colon                            otherwise.                            Non-bleeding non-thrombosed internal hemorrhoids                            were found during retroflexion, during perianal                            exam and during digital exam. The hemorrhoids were                            Grade II (internal hemorrhoids that prolapse but                            reduce spontaneously). Complications:            No immediate complications. Estimated Blood Loss:     Estimated blood loss was minimal. Impression:               -  Hemorrhoids found on digital rectal exam.                           - Patent end-to-end ileo-colonic anastomosis,                            characterized by healthy appearing mucosa.                            Fistulous tract from colon to neo-terminal ileum                            (unchanged from prior examinations).                           - Three 2 to 6 mm polyps in the sigmoid colon and                            in the descending colon, removed with a cold snare.                            Resected and retrieved.                           - Normal mucosa in the entire examined colon                            otherwise.                           - Non-bleeding non-thrombosed internal hemorrhoids. Recommendation:           - The patient will be observed post-procedure,                            until all discharge criteria are met.                           - Discharge patient to home.                           - Patient has a contact number available for                            emergencies. The signs and symptoms of potential                            delayed complications were discussed with the                            patient. Return to normal activities tomorrow.                            Written discharge instructions were provided to the  patient.                           - High fiber diet.                           - Use FiberCon 1-2 tablets PO daily.                            - Continue present medications.                           - Await pathology results.                           - Repeat colonoscopy in 1 year for surveillance no                            matter pathology due to Lynch Syndrome.                           - Recommend 2-week trial of Bentyl 20 mg 1-3 times                            daily around time of significant abdominal                            cramping/pain. Do not have to use all of the                            medication daily however if not necessary.                           - Consider updated CT-AP for further evaluation of                            pain/discomfort if it persists.                           - The findings and recommendations were discussed                            with the patient.                           - The findings and recommendations were discussed                            with the patient's family. Corliss Parish, MD 01/15/2023 2:08:34 PM

## 2023-01-15 NOTE — Op Note (Signed)
[facility] Patient Name: Laura Douglas Procedure Date: 01/15/2023 1:00 PM MRN: [id number] Endoscopist: Corliss Parish , MD, [id number] Age: 62 Referring MD:  Date of Birth: 11-09-1960 Gender: Female Account #: [id number] Procedure:                Upper GI endoscopy Indications:              Abdominal pain in the right lower quadrant,                            Heartburn, abdominal cramping Medicines:                Monitored Anesthesia Care Procedure:                Pre-Anesthesia Assessment:                           - Prior to the procedure, a History and Physical                            was performed, and patient medications and                            allergies were reviewed. The patient's tolerance of                            previous anesthesia was also reviewed. The risks                            and benefits of the procedure and the sedation                            options and risks were discussed with the patient.                            All questions were answered, and informed consent                            was obtained. Prior Anticoagulants: The patient has                            taken no anticoagulant or antiplatelet agents. ASA                            Grade Assessment: II - A patient with mild systemic                            disease. After reviewing the risks and benefits,                            the patient was deemed in satisfactory condition to                            undergo the procedure.  After obtaining informed consent, the endoscope was                            passed under direct vision. Throughout the                            procedure, the patient's blood pressure, pulse, and                            oxygen saturations were monitored continuously. The                            Olympus Scope [id number] was introduced through the                            mouth, and  advanced to the second part of duodenum.                            The upper GI endoscopy was accomplished without                            difficulty. The patient tolerated the procedure. Scope In: Scope Out: Findings:                 No gross lesions were noted in the entire esophagus.                           The Z-line was regular and was found 37 cm from the                            incisors.                           An angulation deformity was found in the gastric                            body.                           Patchy mildly erythematous mucosa was found in the                            entire examined stomach. Biopsies were taken with a                            cold forceps for histology and Helicobacter pylori                            testing.                           No gross lesions were noted in the duodenal bulb,                            in the first portion of  the duodenum and in the                            second portion of the duodenum. Complications:            No immediate complications. Estimated Blood Loss:     Estimated blood loss was minimal. Impression:               - No gross lesions in the entire esophagus.                           - Z-line regular, 37 cm from the incisors.                           - Angulation deformity in the gastric body.                           - Erythematous mucosa in the stomach. Biopsied.                           - No gross lesions in the duodenal bulb, in the                            first portion of the duodenum and in the second                            portion of the duodenum. Recommendation:           - Proceed to scheduled colonoscopy.                           - Increase PPI to 40 mg twice daily (take 30                            minutes before a meal) for a month to see if                            symptoms improve. May also use Pepcid 20 mg right                            before bedtime if  helpful.                           - Observe patient's clinical course.                           - Await pathology results.                           - Repeat upper endoscopy in 2-4 years for screening                            purposes due to underlying Lynch Syndrome.                           -  The findings and recommendations were discussed                            with the patient.                           - The findings and recommendations were discussed                            with the patient's family. Corliss Parish, MD 01/15/2023 2:02:49 PM

## 2023-01-15 NOTE — Progress Notes (Signed)
Pt's states no medical or surgical changes since previsit or office visit. 

## 2023-01-18 ENCOUNTER — Telehealth: Payer: Self-pay

## 2023-01-18 NOTE — Telephone Encounter (Signed)
  Follow up Call-     01/15/2023    1:01 PM  Call back number  Post procedure Call Back phone  # [phone]  Permission to leave phone message Yes     Patient questions:  Do you have a fever, pain , or abdominal swelling? No. Pain Score  0 *  Have you tolerated food without any problems? Yes.    Have you been able to return to your normal activities? Yes.    Do you have any questions about your discharge instructions: Diet   No. Medications  No. Follow up visit  No.  Do you have questions or concerns about your Care? No.  Actions: * If pain score is 4 or above: No action needed, pain <4.

## 2023-01-20 ENCOUNTER — Encounter: Payer: Self-pay | Admitting: Gastroenterology

## 2023-03-11 LAB — HM MAMMOGRAPHY

## 2023-04-09 ENCOUNTER — Ambulatory Visit (HOSPITAL_COMMUNITY)
Admission: EM | Admit: 2023-04-09 | Discharge: 2023-04-09 | Disposition: A | Discharge status: Home / Self Care | Payer: 59 | Attending: Family Medicine | Admitting: Family Medicine

## 2023-04-09 ENCOUNTER — Encounter (HOSPITAL_COMMUNITY): Payer: Self-pay

## 2023-04-09 DIAGNOSIS — R1013 Epigastric pain: Secondary | ICD-10-CM

## 2023-04-09 DIAGNOSIS — K219 Gastro-esophageal reflux disease without esophagitis: Secondary | ICD-10-CM

## 2023-04-09 MED ORDER — ONDANSETRON 4 MG PO TBDP: 4.0000 mg | ORAL_TABLET | Freq: Three times a day (TID) | ORAL | 0 refills | Status: DC | PRN

## 2023-04-09 MED ORDER — SUCRALFATE 1 G PO TABS: 1.0000 g | ORAL_TABLET | Freq: Two times a day (BID) | ORAL | 0 refills | Status: DC

## 2023-04-09 NOTE — ED Provider Notes (Signed)
[facility]    CSN: [id number] Arrival date & time: 04/09/23  1245      History   Chief Complaint Chief Complaint  Patient presents with   Burning in Chest    Headache    HPI Laura Douglas is a 62 y.o. female.   Here for burning in her epigastric area.  The burning then extends into her chest.  She has had some sour fluid come up into her mouth and this sometimes makes her cough.  She has had this trouble sometimes in the middle the night, but this has been more persistent in the last 4 days.  She has had some headache.    No fever or congestion.  She has had some nausea with the symptoms in the last few days but no vomiting or diarrhea.  Last bowel movement was today.  No blood in the stool  She is established with a gastroenterologist and has had a colonoscopy done in August that had some polyps.  She does already take pantoprazole 40 mg  Past Medical History:  Diagnosis Date   Blood transfusion without reported diagnosis    Colon cancer (HCC)    possible? Patient had surgery in [place]. Denies needing adjuvant therapy. The surgery "removed a tumor from the colon".   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    Patient Active Problem List   Diagnosis Date Noted   Hyperlipidemia associated with type 2 diabetes mellitus (HCC) 04/22/2021   Gastritis and gastroduodenitis 11/05/2019   Helicobacter pylori infection 11/05/2019   Abnormal colonoscopy 11/05/2019   Other constipation 11/05/2019   Lynch syndrome 11/02/2018   History of colon cancer 11/02/2018   History of colonic polyps 11/02/2018   Facial lesion 11/02/2018   Peripheral edema 01/06/2018   Hepatic steatosis 03/11/2017   Dyspareunia, female 09/01/2016   Pain in joint, pelvic region and  thigh 09/01/2016   Hypertension complicating diabetes (HCC) 08/17/2016   Pure hypercholesterolemia 06/05/2015   Overweight 06/03/2015   Diabetes mellitus (HCC) 06/03/2015   Back pain 06/03/2015   GERD (gastroesophageal reflux disease) 06/03/2015   Genetic testing 10/26/2014   Uterine cancer (HCC)    Family history of uterine cancer    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   Endometrial cancer (HCC) 09/11/2014   Colon cancer (HCC) 08/10/2007    Past Surgical History:  Procedure Laterality Date   ABDOMINAL HYSTERECTOMY     abortions     twice    arm surgery     left arm   COLON SURGERY     COLON SURGERY  2010   tumor removal   COLONOSCOPY  02/17/2018   Mansouraty ta x 2, hems   FRACTURE SURGERY  2006   Lt lower arm   MULTIPLE TOOTH EXTRACTIONS     ROBOTIC ASSISTED TOTAL HYSTERECTOMY WITH BILATERAL SALPINGO OOPHERECTOMY Bilateral 09/11/2014   Procedure: ROBOTIC ASSISTED TOTAL LAPAROSCOPIC HYSTERECTOMY WITH BILATERAL SALPINGO OOPHORECTOMY LYMPH NODE DISSECTION; LYSIS OF ADHESIONS;  Surgeon: Laurette Schimke, MD;  Location: [facility];  Service: Gynecology;  Laterality: Bilateral;   TUBAL LIGATION  1993   UPPER GASTROINTESTINAL ENDOSCOPY      OB History     Gravida  6   Para  4   Term  4   Preterm  0   AB  2   Living  3      SAB  2   IAB  0   Ectopic  0   Multiple      Live Births  3            Home Medications    Prior to Admission medications   Medication Sig Start Date End Date Taking? Authorizing Provider  atorvastatin (LIPITOR) 40 MG tablet Take 1 tablet (40 mg total) by mouth daily. 11/25/22   Hoy Register, MD  Blood Glucose Monitoring Suppl (AGAMATRIX PRESTO) w/Device KIT Check sugars before meals twice sugars 08/17/16   Julieanne Manson, MD  dicyclomine (BENTYL) 20 MG tablet Take 1 tablet (20 mg total) by mouth 3 (three) times daily as needed for spasms (Take 1-3 times daily around time of significant abdominal cramping/pain). 01/15/23    Mansouraty, Netty Starring., MD  famotidine (PEPCID) 20 MG tablet Take 1 tablet (20 mg total) by mouth at bedtime. Patient not taking: Reported on 12/23/2022 11/25/22   Hoy Register, MD  glipiZIDE (GLUCOTROL) 10 MG tablet Take 1 tablet (10 mg total) by mouth 2 (two) times daily before a meal. 11/25/22   Hoy Register, MD  glucose blood (AGAMATRIX PRESTO TEST) test strip Check sugars twice daily before meals 08/29/17   Julieanne Manson, MD  hydrochlorothiazide (HYDRODIURIL) 25 MG tablet Take 1 tablet (25 mg total) by mouth daily. 11/25/22   Hoy Register, MD  insulin glargine (LANTUS SOLOSTAR) 100 UNIT/ML Solostar Pen Inject 8 Units into the skin at bedtime. 11/25/22   Hoy Register, MD  Insulin Pen Needle 31G X 5 MM MISC 1 each by Does not apply route at bedtime. 11/25/22   Hoy Register, MD  lisinopril (ZESTRIL) 5 MG tablet 1 tab by mouth daily with HCTZ 11/25/22   Hoy Register, MD  metFORMIN (GLUCOPHAGE) 1000 MG tablet TAKE 1 TABLET BY MOUTH TWICE DAILY WITH A MEAL 11/25/22   Hoy Register, MD  ondansetron (ZOFRAN) 4 MG tablet Take 1 tablet (4 mg total) by mouth every 8 (eight) hours as needed for nausea or vomiting. Patient not taking: Reported on 01/15/2023 11/25/22   Hoy Register, MD  pantoprazole (PROTONIX) 40 MG tablet Take 1 tablet (40 mg total) by mouth 2 (two) times daily before a meal. 01/15/23   Mansouraty, Netty Starring., MD  Simethicone 80 MG TABS Take 1 tablet (80 mg total) by mouth in the morning, at noon, and at bedtime. Patient not taking: Reported on 12/23/2022 04/25/20   Hoy Register, MD    Family History Family History  Problem Relation Age of Onset   Diabetes Mother    Prostate cancer Father    Uterine cancer Sister 5   Cancer Sister        colon cancer as well age 61?   Colon cancer Sister    Alcohol abuse Brother    Cirrhosis Brother        cause of death   Uterine cancer Maternal Aunt    Breast cancer Neg Hx    Rectal cancer Neg Hx    Stomach cancer Neg  Hx    Esophageal cancer Neg Hx     Social History Social History   Tobacco Use   Smoking status: Never   Smokeless tobacco: Never  Vaping Use   Vaping status: Never Used  Substance Use Topics   Alcohol use: No   Drug use: No  Allergies   Patient has no known allergies.   Review of Systems Review of Systems  Neurological:  Positive for headaches.     Physical Exam Triage Vital Signs ED Triage Vitals  Encounter Vitals Group     BP 04/09/23 1408 117/74     Systolic BP Percentile --      Diastolic BP Percentile --      Pulse Rate 04/09/23 1408 (!) 104     Resp 04/09/23 1408 16     Temp 04/09/23 1408 99.1 F (37.3 C)     Temp Source 04/09/23 1408 Oral     SpO2 04/09/23 1408 96 %     Weight --      Height --      Head Circumference --      Peak Flow --      Pain Score 04/09/23 1410 8     Pain Loc --      Pain Education --      Exclude from Growth Chart --    No data found.  Updated Vital Signs BP 117/74 (BP Location: Right Arm)   Pulse (!) 104   Temp 99.1 F (37.3 C) (Oral)   Resp 16   LMP  (LMP Unknown)   SpO2 96%   Visual Acuity Right Eye Distance:   Left Eye Distance:   Bilateral Distance:    Right Eye Near:   Left Eye Near:    Bilateral Near:     Physical Exam   UC Treatments / Results  Labs (all labs ordered are listed, but only abnormal results are displayed) Labs Reviewed - No data to display  EKG   Radiology No results found.  Procedures Procedures (including critical care time)  Medications Ordered in UC Medications - No data to display  Initial Impression / Assessment and Plan / UC Course  I have reviewed the triage vital signs and the nursing notes.  Pertinent labs & imaging results that were available during my care of the patient were reviewed by me and considered in my medical decision making (see chart for details).      She is already taking pantoprazole, so Carafate is sent in for that.  Zofran is sent in  for the nausea.  I have asked her to follow-up with her gastroenterologist. Final Clinical Impressions(s) / UC Diagnoses   Final diagnoses:  None   Discharge Instructions   None    ED Prescriptions   None    PDMP not reviewed this encounter.   Zenia Resides, MD 04/09/23 [phone]

## 2023-04-09 NOTE — Discharge Instructions (Addendum)
Take sucralfate 1 g--1 tablet 2 times daily for stomach acid. (Tome sucralfato 1 g - 1 tableta 2 veces al da para el cido del estmago.)  Continue taking the pantoprazole. (Contine tomando pantoprazol.)   Ondansetron dissolved in the mouth every 8 hours as needed for nausea or vomiting. Clear liquids(water, gatorade/pedialyte, ginger ale/sprite, chicken broth/soup) and bland things(crackers/toast, rice, potato, bananas) to eat. Avoid acidic foods like lemon/lime/orange/tomato, and avoid greasy/spicy foods.   (Ondansetrn se disuelve en la boca cada 8 horas segn sea necesario para las nuseas o los vmitos. Lquidos claros (agua, gatorade/pedialyte, ginger ale/sprite, caldo/sopa de pollo) y cosas blandas (galletas saladas/tostadas, arroz, papa, pltanos) para comer. Evite los alimentos cidos como el limn, la lima, la naranja o el [place], y evite los alimentos grasosos o picantes.)   Please follow-up with your gastroenterologist about this problem. (Por favor haga un seguimiento con su gastroenterlogo [facility].)

## 2023-04-09 NOTE — ED Triage Notes (Signed)
Pt presents with "burning in my chest" and a headache x 4 days. Pt's symptoms began on Monday 04/05/23. Pt reports she took Excedrin for her headache, last dose being "sometime yesterday." Denies Tylenol/Ibuprofen use.

## 2023-04-30 ENCOUNTER — Other Ambulatory Visit: Payer: Self-pay | Admitting: Family Medicine

## 2023-04-30 DIAGNOSIS — E1149 Type 2 diabetes mellitus with other diabetic neurological complication: Secondary | ICD-10-CM

## 2023-04-30 DIAGNOSIS — I10 Essential (primary) hypertension: Secondary | ICD-10-CM

## 2023-04-30 DIAGNOSIS — E1169 Type 2 diabetes mellitus with other specified complication: Secondary | ICD-10-CM

## 2023-04-30 MED ORDER — METFORMIN HCL 1000 MG PO TABS
ORAL_TABLET | ORAL | 0 refills | Status: DC
Start: 2023-04-30 — End: 2023-07-01

## 2023-04-30 MED ORDER — LISINOPRIL 5 MG PO TABS
ORAL_TABLET | ORAL | 0 refills | Status: DC
Start: 2023-04-30 — End: 2023-07-01

## 2023-04-30 MED ORDER — HYDROCHLOROTHIAZIDE 25 MG PO TABS
25.0000 mg | ORAL_TABLET | Freq: Every day | ORAL | 0 refills | Status: DC
Start: 2023-04-30 — End: 2023-07-01

## 2023-04-30 MED ORDER — ATORVASTATIN CALCIUM 40 MG PO TABS
40.0000 mg | ORAL_TABLET | Freq: Every day | ORAL | 0 refills | Status: DC
Start: 2023-04-30 — End: 2023-07-01

## 2023-04-30 NOTE — Telephone Encounter (Signed)
Patient requesting refills. Future visit in 2 weeks .  Requested Prescriptions  Pending Prescriptions Disp Refills   lisinopril (ZESTRIL) 5 MG tablet 90 tablet 0    Sig: 1 tab by mouth daily with HCTZ     Cardiovascular:  ACE Inhibitors Passed - 04/30/2023 11:04 AM      Passed - Cr in normal range and within 180 days    Creat  Date Value Ref Range Status  06/04/2015 0.66 0.50 - 1.05 mg/dL Final   Creatinine, Ser  Date Value Ref Range Status  11/25/2022 0.64 0.57 - 1.00 mg/dL Final   Creatinine, Urine  Date Value Ref Range Status  06/04/2015 204 20 - 320 mg/dL Final         Passed - K in normal range and within 180 days    Potassium  Date Value Ref Range Status  11/25/2022 4.1 3.5 - 5.2 mmol/L Final         Passed - Patient is not pregnant      Passed - Last BP in normal range    BP Readings from Last 1 Encounters:  04/09/23 117/74         Passed - Valid encounter within last 6 months    Recent Outpatient Visits           5 months ago Type 2 diabetes mellitus with other neurologic complication, without long-term current use of insulin (HCC)   [facility] [place] [facility] Dept Of [place]. [facility] Hoy Register, MD   9 months ago Type 2 diabetes mellitus with other neurologic complication, without long-term current use of insulin (HCC)   [facility] [place] - [facility] Dept Of [place]. [facility] Hoy Register, MD   1 year ago Type 2 diabetes mellitus with other neurologic complication, without long-term current use of insulin (HCC)   [facility] [place] - [facility] Dept Of [place]. [facility] Hoy Register, MD   2 years ago Type 2 diabetes mellitus with other neurologic complication, without long-term current use of insulin (HCC)   [facility] [place] - A Dept Of [place]. [facility] Hoy Register, MD   2 years ago Gastroesophageal reflux disease, unspecified whether esophagitis present    [facility] at [facility], Gildardo Pounds, NP       Future Appointments             In 2 weeks Sharon Seller, [place] [facility] [place] [facility] - A Dept Of [place]. [facility]             hydrochlorothiazide (HYDRODIURIL) 25 MG tablet 90 tablet 0    Sig: Take 1 tablet (25 mg total) by mouth daily.     Cardiovascular: Diuretics - Thiazide Passed - 04/30/2023 11:04 AM      Passed - Cr in normal range and within 180 days    Creat  Date Value Ref Range Status  06/04/2015 0.66 0.50 - 1.05 mg/dL Final   Creatinine, Ser  Date Value Ref Range Status  11/25/2022 0.64 0.57 - 1.00 mg/dL Final   Creatinine, Urine  Date Value Ref Range Status  06/04/2015 204 20 - 320 mg/dL Final         Passed - K in normal range and within 180 days    Potassium  Date Value Ref Range Status  11/25/2022 4.1 3.5 - 5.2 mmol/L Final  Passed - Na in normal range and within 180 days    Sodium  Date Value Ref Range Status  11/25/2022 140 134 - 144 mmol/L Final         Passed - Last BP in normal range    BP Readings from Last 1 Encounters:  04/09/23 117/74         Passed - Valid encounter within last 6 months    Recent Outpatient Visits           5 months ago Type 2 diabetes mellitus with other neurologic complication, without long-term current use of insulin (HCC)   [place] [facility] [place] - [facility] Dept Of [place]. [facility] Hoy Register, MD   9 months ago Type 2 diabetes mellitus with other neurologic complication, without long-term current use of insulin (HCC)   [facility] [place] - A Dept Of [place]. [facility] Hoy Register, MD   1 year ago Type 2 diabetes mellitus with other neurologic complication, without long-term current use of insulin (HCC)   [facility] [facility] [place] - A Dept Of [place]. [facility] Hoy Register, MD   2 years ago Type 2 diabetes mellitus with other  neurologic complication, without long-term current use of insulin (HCC)   [facility] [place] - A Dept Of [place]. [facility] Hoy Register, MD   2 years ago Gastroesophageal reflux disease, unspecified whether esophagitis present   [facility] at [facility], Gildardo Pounds, NP       Future Appointments             In 2 weeks Sharon Seller, [place] [facility] - A Dept Of [place]. [facility]             metFORMIN (GLUCOPHAGE) 1000 MG tablet 180 tablet 0    Sig: TAKE 1 TABLET BY MOUTH TWICE DAILY WITH A MEAL     Endocrinology:  Diabetes - Biguanides Failed - 04/30/2023 11:04 AM      Failed - HBA1C is between 0 and 7.9 and within 180 days    HbA1c, POC (controlled diabetic range)  Date Value Ref Range Status  11/25/2022 10.4 (A) 0.0 - 7.0 % Final         Failed - B12 Level in normal range and within 720 days    No results found for: "VITAMINB12"       Failed - CBC within normal limits and completed in the last 12 months    WBC  Date Value Ref Range Status  11/17/2016 6.4 3.4 - 10.8 x10E3/uL Final  03/12/2016 7.5 4.0 - 10.5 K/uL Final   RBC  Date Value Ref Range Status  11/17/2016 4.96 3.77 - 5.28 x10E6/uL Final  03/12/2016 4.77 3.87 - 5.11 MIL/uL Final   Hemoglobin  Date Value Ref Range Status  11/17/2016 13.7 11.1 - 15.9 g/dL Final   Hematocrit  Date Value Ref Range Status  11/17/2016 42.8 34.0 - 46.6 % Final   MCHC  Date Value Ref Range Status  11/17/2016 32.0 31.5 - 35.7 g/dL Final  69/62/9528 41.3 30.0 - 36.0 g/dL Final   [facility]  Date Value Ref Range Status  11/17/2016 27.6 26.6 - 33.0 pg Final  03/12/2016 28.7 26.0 - 34.0 pg Final   MCV  Date Value Ref Range Status  11/17/2016 86 79 - 97 fL Final   No results found for: "  PLTCOUNTKUC", "LABPLAT", "POCPLA" RDW  Date Value Ref Range Status  11/17/2016 13.1 12.3 - 15.4 % Final         Passed - Cr in normal range and within  360 days    Creat  Date Value Ref Range Status  06/04/2015 0.66 0.50 - 1.05 mg/dL Final   Creatinine, Ser  Date Value Ref Range Status  11/25/2022 0.64 0.57 - 1.00 mg/dL Final   Creatinine, Urine  Date Value Ref Range Status  06/04/2015 204 20 - 320 mg/dL Final         Passed - eGFR in normal range and within 360 days    GFR, Est African American  Date Value Ref Range Status  06/04/2015 >89 >=60 mL/min Final   GFR calc Af Amer  Date Value Ref Range Status  04/25/2020 86 >59 mL/min/1.73 Final    Comment:    **In accordance with recommendations from the NKF-ASN Task force,**   [facility] is in the process of updating its eGFR calculation to the   2021 CKD-EPI creatinine equation that estimates kidney function   without a race variable.    GFR, Est Non African American  Date Value Ref Range Status  06/04/2015 >89 >=60 mL/min Final    Comment:      The estimated GFR is a calculation valid for adults (>=60 years old) that uses the CKD-EPI algorithm to adjust for age and sex. It is   not to be used for children, pregnant women, hospitalized patients,    patients on dialysis, or with rapidly changing kidney function. According to the NKDEP, eGFR >89 is normal, 60-89 shows mild impairment, 30-59 shows moderate impairment, 15-29 shows severe impairment and <15 is ESRD.      GFR calc non Af Amer  Date Value Ref Range Status  04/25/2020 74 >59 mL/min/1.73 Final   eGFR  Date Value Ref Range Status  11/25/2022 101 >59 mL/min/1.73 Final         Passed - Valid encounter within last 6 months    Recent Outpatient Visits           5 months ago Type 2 diabetes mellitus with other neurologic complication, without long-term current use of insulin (HCC)   [place] [facility] - [facility] Dept Of [place]. [facility] Hoy Register, MD   9 months ago Type 2 diabetes mellitus with other neurologic complication, without long-term current use of insulin (HCC)   [facility] [place] - [facility] Dept Of [place]. [facility] Hoy Register, MD   1 year ago Type 2 diabetes mellitus with other neurologic complication, without long-term current use of insulin (HCC)   [facility] [facility] [place] - A Dept Of [place]. [facility] Hoy Register, MD   2 years ago Type 2 diabetes mellitus with other neurologic complication, without long-term current use of insulin (HCC)   [facility] [place] [facility] [place] - A Dept Of [place]. [facility] Hoy Register, MD   2 years ago Gastroesophageal reflux disease, unspecified whether esophagitis present   [facility] at [facility], Gildardo Pounds, NP       Future Appointments             In 2 weeks Sharon Seller, [place] Organ [place] [facility] - A Dept Of [place]. [facility]             atorvastatin (LIPITOR) 40 MG  tablet 90 tablet 0    Sig: Take 1 tablet (40 mg total) by mouth daily.     Cardiovascular:  Antilipid - Statins Failed - 04/30/2023 11:04 AM      Failed - Lipid Panel in normal range within the last 12 months    Cholesterol, Total  Date Value Ref Range Status  01/02/2022 157 100 - 199 mg/dL Final   LDL Chol Calc (NIH)  Date Value Ref Range Status  01/02/2022 85 0 - 99 mg/dL Final   HDL  Date Value Ref Range Status  01/02/2022 52 >39 mg/dL Final   Triglycerides  Date Value Ref Range Status  01/02/2022 109 0 - 149 mg/dL Final         Passed - Patient is not pregnant      Passed - Valid encounter within last 12 months    Recent Outpatient Visits           5 months ago Type 2 diabetes mellitus with other neurologic complication, without long-term current use of insulin (HCC)   [place] [facility] - [facility] Dept Of [place]. [facility] Hoy Register, MD   9 months ago Type 2 diabetes mellitus with other neurologic complication, without long-term current use of insulin (HCC)   [place] [facility] [place] - A Dept Of [place]. [facility] Hoy Register, MD   1 year ago Type 2 diabetes mellitus with other neurologic complication, without long-term current use of insulin (HCC)   [place] [facility] [place] - A Dept Of [place]. [facility] Hoy Register, MD   2 years ago Type 2 diabetes mellitus with other neurologic complication, without long-term current use of insulin (HCC)   [place] [facility] [place] - A Dept Of [place]. [facility] Hoy Register, MD   2 years ago Gastroesophageal reflux disease, unspecified whether esophagitis present   [facility] Health Primary Care at [facility], Gildardo Pounds, NP       Future Appointments             In 2 weeks Sharon Seller, [place] [facility] - A Dept Of [place]. [facility]

## 2023-04-30 NOTE — Telephone Encounter (Signed)
Medication Refill -  Most Recent Primary Care Visit:  Provider: Hoy Register  Department[facility]  Visit Type: OFFICE VISIT  Date: 11/25/2022  Medication: Lisinopril Hydrochlorothiazide metFORMIN Atorvastatin  Has the patient contacted their pharmacy? Yes (Agent: If yes, when and what did the pharmacy advise?)  Contact office as patient has no additional refills.   Is this the correct pharmacy for this prescription? Yes  This is the patient's preferred pharmacy:  [facility] [address], [place] - [address] [place] [address] Phone: [phone] Fax: [phone]  Has the prescription been filled recently? Yes  Is the patient out of the medication? Yes  Has the patient been seen for an appointment in the last year OR does the patient have an upcoming appointment? Yes  Can we respond through MyChart? No, requesting call back with Spanish interpreter, [phone]   Agent: Please be advised that Rx refills may take up to 3 business days. We ask that you follow-up with your pharmacy.

## 2023-05-20 ENCOUNTER — Ambulatory Visit: Payer: 59 | Admitting: Physician Assistant

## 2023-06-14 ENCOUNTER — Other Ambulatory Visit: Payer: Self-pay

## 2023-06-14 ENCOUNTER — Emergency Department (HOSPITAL_COMMUNITY): Payer: 59

## 2023-06-14 ENCOUNTER — Encounter (HOSPITAL_COMMUNITY): Payer: Self-pay

## 2023-06-14 ENCOUNTER — Emergency Department (HOSPITAL_COMMUNITY)
Admission: EM | Admit: 2023-06-14 | Discharge: 2023-06-14 | Disposition: A | Discharge status: Home / Self Care | Payer: 59 | Attending: Emergency Medicine | Admitting: Emergency Medicine

## 2023-06-14 ENCOUNTER — Ambulatory Visit (HOSPITAL_COMMUNITY)
Admission: EM | Admit: 2023-06-14 | Discharge: 2023-06-14 | Disposition: A | Discharge status: Home / Self Care | Payer: 59 | Attending: Internal Medicine | Admitting: Internal Medicine

## 2023-06-14 DIAGNOSIS — J09X2 Influenza due to identified novel influenza A virus with other respiratory manifestations: Secondary | ICD-10-CM | POA: Insufficient documentation

## 2023-06-14 DIAGNOSIS — Z85038 Personal history of other malignant neoplasm of large intestine: Secondary | ICD-10-CM | POA: Diagnosis not present

## 2023-06-14 DIAGNOSIS — N3000 Acute cystitis without hematuria: Secondary | ICD-10-CM | POA: Insufficient documentation

## 2023-06-14 DIAGNOSIS — E860 Dehydration: Secondary | ICD-10-CM | POA: Insufficient documentation

## 2023-06-14 DIAGNOSIS — A419 Sepsis, unspecified organism: Secondary | ICD-10-CM | POA: Diagnosis not present

## 2023-06-14 DIAGNOSIS — I10 Essential (primary) hypertension: Secondary | ICD-10-CM | POA: Diagnosis not present

## 2023-06-14 DIAGNOSIS — N10 Acute tubulo-interstitial nephritis: Secondary | ICD-10-CM | POA: Diagnosis not present

## 2023-06-14 DIAGNOSIS — J111 Influenza due to unidentified influenza virus with other respiratory manifestations: Secondary | ICD-10-CM

## 2023-06-14 DIAGNOSIS — Z8542 Personal history of malignant neoplasm of other parts of uterus: Secondary | ICD-10-CM | POA: Diagnosis not present

## 2023-06-14 DIAGNOSIS — N390 Urinary tract infection, site not specified: Secondary | ICD-10-CM | POA: Diagnosis not present

## 2023-06-14 DIAGNOSIS — R059 Cough, unspecified: Secondary | ICD-10-CM | POA: Diagnosis present

## 2023-06-14 DIAGNOSIS — Z20822 Contact with and (suspected) exposure to covid-19: Secondary | ICD-10-CM | POA: Insufficient documentation

## 2023-06-14 DIAGNOSIS — Z7984 Long term (current) use of oral hypoglycemic drugs: Secondary | ICD-10-CM | POA: Insufficient documentation

## 2023-06-14 LAB — CBC WITH DIFFERENTIAL/PLATELET
Abs Immature Granulocytes: 0.04 10*3/uL (ref 0.00–0.07)
Basophils Absolute: 0 10*3/uL (ref 0.0–0.1)
Basophils Relative: 0 %
Eosinophils Absolute: 0 10*3/uL (ref 0.0–0.5)
Eosinophils Relative: 0 %
HCT: 42 % (ref 36.0–46.0)
Hemoglobin: 13.5 g/dL (ref 12.0–15.0)
Immature Granulocytes: 1 %
Lymphocytes Relative: 24 %
Lymphs Abs: 1.4 10*3/uL (ref 0.7–4.0)
MCH: 29 pg (ref 26.0–34.0)
MCHC: 32.1 g/dL (ref 30.0–36.0)
MCV: 90.3 fL (ref 80.0–100.0)
Monocytes Absolute: 0.4 10*3/uL (ref 0.1–1.0)
Monocytes Relative: 7 %
Neutro Abs: 3.9 10*3/uL (ref 1.7–7.7)
Neutrophils Relative %: 68 %
Platelets: 177 10*3/uL (ref 150–400)
RBC: 4.65 MIL/uL (ref 3.87–5.11)
RDW: 12.7 % (ref 11.5–15.5)
WBC: 5.8 10*3/uL (ref 4.0–10.5)
nRBC: 0 % (ref 0.0–0.2)

## 2023-06-14 LAB — POCT URINALYSIS DIP (MANUAL ENTRY)
Glucose, UA: NEGATIVE mg/dL
Nitrite, UA: NEGATIVE
Protein Ur, POC: >=300 mg/dL — AB
Spec Grav, UA: 1.025 (ref 1.010–1.025)
Urobilinogen, UA: 0.2 U/dL
pH, UA: 6 (ref 5.0–8.0)

## 2023-06-14 LAB — URINALYSIS, W/ REFLEX TO CULTURE (INFECTION SUSPECTED)
Bilirubin Urine: NEGATIVE
Glucose, UA: NEGATIVE mg/dL
Ketones, ur: 5 mg/dL — AB
Nitrite: NEGATIVE
Protein, ur: NEGATIVE mg/dL
Specific Gravity, Urine: 1.006 (ref 1.005–1.030)
pH: 5 (ref 5.0–8.0)

## 2023-06-14 LAB — COMPREHENSIVE METABOLIC PANEL
ALT: 30 U/L (ref 0–44)
AST: 30 U/L (ref 15–41)
Albumin: 3.3 g/dL — ABNORMAL LOW (ref 3.5–5.0)
Alkaline Phosphatase: 54 U/L (ref 38–126)
Anion gap: 11 (ref 5–15)
BUN: 11 mg/dL (ref 8–23)
CO2: 21 mmol/L — ABNORMAL LOW (ref 22–32)
Calcium: 8.6 mg/dL — ABNORMAL LOW (ref 8.9–10.3)
Chloride: 100 mmol/L (ref 98–111)
Creatinine, Ser: 0.8 mg/dL (ref 0.44–1.00)
GFR, Estimated: >60 mL/min (ref >60)
Glucose, Bld: 204 mg/dL — ABNORMAL HIGH (ref 70–99)
Potassium: 3.2 mmol/L — ABNORMAL LOW (ref 3.5–5.1)
Sodium: 132 mmol/L — ABNORMAL LOW (ref 135–145)
Total Bilirubin: 0.7 mg/dL (ref 0.0–1.2)
Total Protein: 6.8 g/dL (ref 6.5–8.1)

## 2023-06-14 LAB — RESP PANEL BY RT-PCR (RSV, FLU A&B, COVID)  RVPGX2
Influenza A by PCR: POSITIVE — AB
Influenza B by PCR: NEGATIVE
Resp Syncytial Virus by PCR: NEGATIVE
SARS Coronavirus 2 by RT PCR: NEGATIVE

## 2023-06-14 LAB — I-STAT CG4 LACTIC ACID, ED: Lactic Acid, Venous: 1.3 mmol/L (ref 0.5–1.9)

## 2023-06-14 LAB — LIPASE, BLOOD: Lipase: 29 U/L (ref 11–51)

## 2023-06-14 MED ORDER — IOHEXOL 350 MG/ML SOLN
65.0000 mL | Freq: Once | INTRAVENOUS | Status: AC | PRN
  Administered 2023-06-14: 65 mL via INTRAVENOUS

## 2023-06-14 MED ORDER — CEFTRIAXONE SODIUM 1 G IJ SOLR
1.0000 g | Freq: Once | INTRAMUSCULAR | Status: AC
  Administered 2023-06-14: 1 g via INTRAVENOUS
  Filled 2023-06-14: qty 10

## 2023-06-14 MED ORDER — SODIUM CHLORIDE 0.9 % IV BOLUS
1000.0000 mL | Freq: Once | INTRAVENOUS | Status: AC
Start: 2023-06-14 — End: 2023-06-14
  Administered 2023-06-14: 1000 mL via INTRAVENOUS

## 2023-06-14 MED ORDER — LACTATED RINGERS IV BOLUS
1000.0000 mL | Freq: Once | INTRAVENOUS | Status: AC
  Administered 2023-06-14: 1000 mL via INTRAVENOUS

## 2023-06-14 MED ORDER — KETOROLAC TROMETHAMINE 15 MG/ML IJ SOLN
15.0000 mg | Freq: Once | INTRAMUSCULAR | Status: AC
Start: 2023-06-14 — End: 2023-06-14
  Administered 2023-06-14: 15 mg via INTRAVENOUS
  Filled 2023-06-14: qty 1

## 2023-06-14 MED ORDER — SODIUM CHLORIDE 0.9 % IV BOLUS: 1000.0000 mL | Freq: Once | INTRAVENOUS | Status: AC

## 2023-06-14 MED ORDER — ONDANSETRON 4 MG PO TBDP
4.0000 mg | ORAL_TABLET | Freq: Four times a day (QID) | ORAL | 0 refills | Status: DC | PRN
Start: 2023-06-14 — End: 2023-07-22

## 2023-06-14 MED ORDER — ONDANSETRON HCL 4 MG/2ML IJ SOLN
4.0000 mg | Freq: Once | INTRAMUSCULAR | Status: AC
Start: 2023-06-14 — End: 2023-06-14
  Administered 2023-06-14: 4 mg via INTRAVENOUS
  Filled 2023-06-14: qty 2

## 2023-06-14 MED ORDER — CEFADROXIL 500 MG PO CAPS: 500.0000 mg | ORAL_CAPSULE | Freq: Two times a day (BID) | ORAL | 0 refills | Status: DC

## 2023-06-14 MED ORDER — POTASSIUM CHLORIDE CRYS ER 20 MEQ PO TBCR
40.0000 meq | EXTENDED_RELEASE_TABLET | Freq: Once | ORAL | Status: AC
Start: 2023-06-14 — End: 2023-06-14
  Administered 2023-06-14: 40 meq via ORAL
  Filled 2023-06-14: qty 2

## 2023-06-14 NOTE — ED Notes (Addendum)
Patient is being discharged from the Urgent Care and sent to the Emergency Department via [facility] . Per Dr. Leonides Grills, patient is in need of higher level of care due to Pyelonephritis and Septic Shock. Patient is aware and verbalizes understanding of plan of care.  Vitals:   06/14/23 0825 06/14/23 0903  BP: (!) 87/55 (!) 87/59  Pulse: (!) 135 (!) 137  Resp: 16   Temp: 98.3 F (36.8 C)   SpO2: 94%

## 2023-06-14 NOTE — ED Provider Notes (Signed)
[name] Provider Note   CSN: [id number] Arrival date & time: 06/14/23  4010     History  No chief complaint on file.   Laura Douglas is a 62 y.o. female with past medical history significant for hypertension, obesity, history of colon cancer, uterine cancer who presents with concern for cough, abdominal pain, dysuria, nausea.  Reports back pain secondary to cough.  Denies chest pain, does endorse some abdominal pain.  She was seen in urgent care just prior to arrival and was transported urgently secondary to hypotension, BP high urgent care, blood pressure 83/55, pulse 135 to urgent care.  HPI     Home Medications Prior to Admission medications   Medication Sig Start Date End Date Taking? Authorizing Provider  cefadroxil (DURICEF) 500 MG capsule Take 1 capsule (500 mg total) by mouth 2 (two) times daily. 06/14/23  Yes Charrie Mcconnon H, PA-C  ondansetron (ZOFRAN-ODT) 4 MG disintegrating tablet Take 1 tablet (4 mg total) by mouth every 6 (six) hours as needed for nausea or vomiting. 06/14/23  Yes Neli Fofana H, PA-C  atorvastatin (LIPITOR) 40 MG tablet Take 1 tablet (40 mg total) by mouth daily. 04/30/23   Hoy Register, MD  Blood Glucose Monitoring Suppl (AGAMATRIX PRESTO) w/Device KIT Check sugars before meals twice sugars 08/17/16   Julieanne Manson, MD  dicyclomine (BENTYL) 20 MG tablet Take 1 tablet (20 mg total) by mouth 3 (three) times daily as needed for spasms (Take 1-3 times daily around time of significant abdominal cramping/pain). 01/15/23   Mansouraty, Netty Starring., MD  famotidine (PEPCID) 20 MG tablet Take 1 tablet (20 mg total) by mouth at bedtime. Patient not taking: Reported on 12/23/2022 11/25/22   Hoy Register, MD  glipiZIDE (GLUCOTROL) 10 MG tablet Take 1 tablet (10 mg total) by mouth 2 (two) times daily before a meal. 11/25/22   Hoy Register, MD  glucose blood (AGAMATRIX PRESTO TEST) test  strip Check sugars twice daily before meals 08/29/17   Julieanne Manson, MD  hydrochlorothiazide (HYDRODIURIL) 25 MG tablet Take 1 tablet (25 mg total) by mouth daily. 04/30/23   Hoy Register, MD  insulin glargine (LANTUS SOLOSTAR) 100 UNIT/ML Solostar Pen Inject 8 Units into the skin at bedtime. 11/25/22   Hoy Register, MD  Insulin Pen Needle 31G X 5 MM MISC 1 each by Does not apply route at bedtime. 11/25/22   Hoy Register, MD  lisinopril (ZESTRIL) 5 MG tablet 1 tab by mouth daily with HCTZ 04/30/23   Hoy Register, MD  metFORMIN (GLUCOPHAGE) 1000 MG tablet TAKE 1 TABLET BY MOUTH TWICE DAILY WITH A MEAL 04/30/23   Hoy Register, MD  pantoprazole (PROTONIX) 40 MG tablet Take 1 tablet (40 mg total) by mouth 2 (two) times daily before a meal. 01/15/23   Mansouraty, Netty Starring., MD  Simethicone 80 MG TABS Take 1 tablet (80 mg total) by mouth in the morning, at noon, and at bedtime. Patient not taking: Reported on 12/23/2022 04/25/20   Hoy Register, MD  sucralfate (CARAFATE) 1 g tablet Take 1 tablet (1 g total) by mouth 2 (two) times daily. 04/09/23   Zenia Resides, MD      Allergies    Patient has no known allergies.    Review of Systems   Review of Systems  All other systems reviewed and are negative.   Physical Exam Updated Vital Signs BP 124/81 (BP Location: Right Arm)   Pulse (!) 105   Temp 97.7 F (  36.5 C) (Oral)   Resp 16   LMP  (LMP Unknown)   SpO2 100%  Physical Exam Vitals and nursing note reviewed.  Constitutional:      General: She is not in acute distress.    Appearance: Normal appearance.  HENT:     Head: Normocephalic and atraumatic.  Eyes:     General:        Right eye: No discharge.        Left eye: No discharge.  Cardiovascular:     Rate and Rhythm: Regular rhythm. Tachycardia present.     Heart sounds: No murmur heard.    No friction rub. No gallop.  Pulmonary:     Effort: Pulmonary effort is normal.     Breath sounds: Normal breath  sounds.  Abdominal:     General: Bowel sounds are normal.     Palpations: Abdomen is soft.     Comments: Diffusely tender in the abdomen but no rebound, rigidity, guarding, or focal tenderness throughout  Skin:    General: Skin is warm and dry.     Capillary Refill: Capillary refill takes less than 2 seconds.  Neurological:     Mental Status: She is alert and oriented to person, place, and time.  Psychiatric:        Mood and Affect: Mood normal.        Behavior: Behavior normal.     ED Results / Procedures / Treatments   Labs (all labs ordered are listed, but only abnormal results are displayed) Labs Reviewed  RESP PANEL BY RT-PCR (RSV, FLU A&B, COVID)  RVPGX2 - Abnormal; Notable for the following components:      Result Value   Influenza A by PCR POSITIVE (*)    All other components within normal limits  COMPREHENSIVE METABOLIC PANEL - Abnormal; Notable for the following components:   Sodium 132 (*)    Potassium 3.2 (*)    CO2 21 (*)    Glucose, Bld 204 (*)    Calcium 8.6 (*)    Albumin 3.3 (*)    All other components within normal limits  URINALYSIS, W/ REFLEX TO CULTURE (INFECTION SUSPECTED) - Abnormal; Notable for the following components:   APPearance HAZY (*)    Hgb urine dipstick SMALL (*)    Ketones, ur 5 (*)    Leukocytes,Ua TRACE (*)    Bacteria, UA FEW (*)    All other components within normal limits  URINE CULTURE  LIPASE, BLOOD  CBC WITH DIFFERENTIAL/PLATELET  I-STAT CG4 LACTIC ACID, ED    EKG None  Radiology CT ABDOMEN PELVIS W CONTRAST Result Date: 06/14/2023 CLINICAL DATA:  Right lower quadrant pain x3 days, fever EXAM: CT ABDOMEN AND PELVIS WITH CONTRAST TECHNIQUE: Multidetector CT imaging of the abdomen and pelvis was performed using the standard protocol following bolus administration of intravenous contrast. RADIATION DOSE REDUCTION: This exam was performed according to the departmental dose-optimization program which includes automated exposure  control, adjustment of the mA and/or kV according to patient size and/or use of iterative reconstruction technique. CONTRAST:  65mL OMNIPAQUE IOHEXOL 350 MG/ML SOLN COMPARISON:  05/01/2021 FINDINGS: Lower chest: No pleural or pericardial effusion. Calcified granuloma medially at the left lung base stable. Hepatobiliary: Fatty liver without focal lesion or biliary ductal dilatation. Gallbladder nondistended. Pancreas: Unremarkable. No pancreatic ductal dilatation or surrounding inflammatory changes. Spleen: Normal in size without focal abnormality. Adrenals/Urinary Tract: Adrenal glands are unremarkable. Kidneys are normal, without renal calculi, focal lesion, or hydronephrosis. Bladder is unremarkable.  Stomach/Bowel: Stomach is incompletely distended, without acute finding. Small bowel decompressed. Post right hemicolectomy. Residual colon is incompletely distended, without acute finding. Vascular/Lymphatic: Mild calcified aortic plaque without aneurysm. Portal vein patent. No abdominal or pelvic adenopathy. Reproductive: Status post hysterectomy. No adnexal masses. Other: No ascites.  No free air. Musculoskeletal: No acute or significant osseous findings. IMPRESSION: 1. No acute findings. 2. Fatty liver. 3.  Aortic Atherosclerosis (ICD10-I70.0). Electronically Signed   By: Corlis Leak M.D.   On: 06/14/2023 13:57   DG Chest 2 View Result Date: 06/14/2023 CLINICAL DATA:  Cough. EXAM: CHEST - 2 VIEW COMPARISON:  09/03/2020 FINDINGS: Right lung clear. Subtle nodular opacity identified left parahilar lung. No pleural effusion or evidence of pneumothorax. The cardiopericardial silhouette is within normal limits for size. No acute bony abnormality. IMPRESSION: Subtle nodular opacity left parahilar lung. CT chest without contrast recommended to further evaluate. Electronically Signed   By: Kennith Center M.D.   On: 06/14/2023 11:01    Procedures Procedures    Medications Ordered in ED Medications  sodium  chloride 0.9 % bolus 1,000 mL (1,000 mLs Intravenous Given by EMS 06/14/23 1028)  iohexol (OMNIPAQUE) 350 MG/ML injection 65 mL (65 mLs Intravenous Contrast Given 06/14/23 1241)  lactated ringers bolus 1,000 mL (1,000 mLs Intravenous New Bag/Given 06/14/23 1406)  cefTRIAXone (ROCEPHIN) 1 g in sodium chloride 0.9 % 100 mL IVPB (0 g Intravenous Stopped 06/14/23 1447)  potassium chloride SA (KLOR-CON M) CR tablet 40 mEq (40 mEq Oral Given 06/14/23 1405)  ketorolac (TORADOL) 15 MG/ML injection 15 mg (15 mg Intravenous Given 06/14/23 1413)  ondansetron (ZOFRAN) injection 4 mg (4 mg Intravenous Given 06/14/23 1412)    ED Course/ Medical Decision Making/ A&P Clinical Course as of 06/14/23 1518  Mon Jun 14, 2023  1440 grunz [CP]    Clinical Course User Index [CP] Olene Floss, PA-C                                 Medical Decision Making Risk Prescription drug management.   This patient is a 62 y.o. female who presents to the ED for concern of abdominal pain, cough, sepsis vitals on arrival, this involves an extensive number of treatment options, and is a complaint that carries with it a high risk of complications and morbidity. The emergent differential diagnosis prior to evaluation includes, but is not limited to,  The causes of generalized abdominal pain include but are not limited to AAA, mesenteric ischemia, appendicitis, diverticulitis, DKA, gastritis, gastroenteritis, AMI, nephrolithiasis, pancreatitis, peritonitis, adrenal insufficiency,lead poisoning, iron toxicity, intestinal ischemia, constipation, UTI,SBO/LBO, splenic rupture, biliary disease, IBD, IBS, PUD, or hepatitis, other undifferentiated septic presentation, upper respiratory infection, flu, COVID, versus other. This is not an exhaustive differential.   Past Medical History / Co-morbidities / Social History: Hypertension, diabetes, colon cancer, uterine cancer  Additional history: Chart reviewed. Pertinent results  include: Reviewed the urgent care visit just prior to arrival, patient was hypotensive and significantly tachycardic  Physical Exam: Physical exam performed. The pertinent findings include: On arrival blood pressure improved but remaining tachycardic, pulse 128, she is afebrile at this time.  Diffusely tender throughout the abdomen but no focal tenderness, no rebound, rigidity, guarding.  Lab Tests: I ordered, and personally interpreted labs.  The pertinent results include: UA with questionable early urinary tract infection with trace leukocytes 6-10 white blood cells and few bacteria, given her initial septic presentation we will treat.  CBC unremarkable, CMP notable for mild hyponatremia, sodium 132, hypokalemia, testing 3.2, we will orally replete her potassium.  Her RVP is positive for flu A.  I think that overall her clinical picture is mostly secondary to her flu.   Imaging Studies: I ordered imaging studies and independently interpreted them including plain film chest x-ray as well as CT abdomen pelvis with contrast which shows no evidence of acute intrathoracic or intra-abdominal abnormality to explain her symptoms.  No evidence of secondary pneumonia.  I agree with the radiologist interpretation.   Cardiac Monitoring:  The patient was maintained on a cardiac monitor.  My attending physician Dr. Fredderick Phenix viewed and interpreted the cardiac monitored which showed an underlying rhythm of: sinus tachycardia. I agree with this interpretation.   Medications: I ordered medication including Rocephin, Toradol, Zofran, potassium, and fluids for dehydration, tachycardia, nausea, suspected urinary tract infection. Reevaluation of the patient after these medicines showed that the patient improved. I have reviewed the patients home medicines and have made adjustments as needed.  Consultations Obtained: Initially consulted with the hospitalist, Dr. Jarvis Newcomer, for possible admission for observation secondary to  persistent tachycardia, hypotension prior to arrival in context of the flu, I do think as patient is feeling improved that she is likely stable for discharge at this time but will recheck her vitals and ensure that her heart rate is improving prior to discharge.   Disposition: After consideration of the diagnostic results and the patients response to treatment, I feel that patient is stable for discharge after tachycardia improves.  Tachycardia improved to 105 I think patient is stable for discharge at this time.    Final Clinical Impression(s) / ED Diagnoses Final diagnoses:  Flu  Acute cystitis without hematuria  Dehydration    Rx / DC Orders ED Discharge Orders          Ordered    cefadroxil (DURICEF) 500 MG capsule  2 times daily        06/14/23 1515    ondansetron (ZOFRAN-ODT) 4 MG disintegrating tablet  Every 6 hours PRN        06/14/23 1515              Caytlyn Evers, [place] H, PA-C 06/14/23 1518    Rolan Bucco, MD 06/14/23 1531

## 2023-06-14 NOTE — ED Provider Notes (Signed)
[facility]    CSN: [id number] Arrival date & time: 06/14/23  0804      History   Chief Complaint Chief Complaint  Patient presents with   Back Pain    HPI TINZLEY KERSHAW is a 62 y.o. female comes to the urgent care with 4-day history of right flank pain, dysuria, urgency and frequency.  Patient says symptoms started 4 days ago and has been persistent.  She has some nausea but no vomiting.  She has generalized bodyaches and headaches.  She is taking ibuprofen with no improvement in symptoms.  She has been experiencing some dizziness and near syncopal episodes over the past couple of days.   HPI  Past Medical History:  Diagnosis Date   Blood transfusion without reported diagnosis    Colon cancer (HCC)    possible? Patient had surgery in [place]. Denies needing adjuvant therapy. The surgery "removed a tumor from the colon".   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    Patient Active Problem List   Diagnosis Date Noted   Hyperlipidemia associated with type 2 diabetes mellitus (HCC) 04/22/2021   Gastritis and gastroduodenitis 11/05/2019   Helicobacter pylori infection 11/05/2019   Abnormal colonoscopy 11/05/2019   Other constipation 11/05/2019   Lynch syndrome 11/02/2018   History of colon cancer 11/02/2018   History of colonic polyps 11/02/2018   Facial lesion 11/02/2018   Peripheral edema 01/06/2018   Hepatic steatosis 03/11/2017   Dyspareunia, female 09/01/2016   Pain in joint, pelvic region and thigh 09/01/2016   Hypertension complicating diabetes (HCC) 08/17/2016   Pure hypercholesterolemia 06/05/2015   Overweight 06/03/2015   Diabetes mellitus (HCC) 06/03/2015   Back pain 06/03/2015   GERD (gastroesophageal reflux disease) 06/03/2015   Genetic  testing 10/26/2014   Uterine cancer (HCC)    Family history of uterine cancer    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   Endometrial cancer (HCC) 09/11/2014   Colon cancer (HCC) 08/10/2007    Past Surgical History:  Procedure Laterality Date   ABDOMINAL HYSTERECTOMY     abortions     twice    arm surgery     left arm   COLON SURGERY     COLON SURGERY  2010   tumor removal   COLONOSCOPY  02/17/2018   Mansouraty ta x 2, hems   FRACTURE SURGERY  2006   Lt lower arm   MULTIPLE TOOTH EXTRACTIONS     ROBOTIC ASSISTED TOTAL HYSTERECTOMY WITH BILATERAL SALPINGO OOPHERECTOMY Bilateral 09/11/2014   Procedure: ROBOTIC ASSISTED TOTAL LAPAROSCOPIC HYSTERECTOMY WITH BILATERAL SALPINGO OOPHORECTOMY LYMPH NODE DISSECTION; LYSIS OF ADHESIONS;  Surgeon: Laurette Schimke, MD;  Location: [facility];  Service: Gynecology;  Laterality: Bilateral;   TUBAL LIGATION  1993   UPPER GASTROINTESTINAL ENDOSCOPY      OB History     Gravida  6   Para  4   Term  4   Preterm  0   AB  2   Living  3      SAB  2   IAB  0   Ectopic  0   Multiple      Live Births  3  Home Medications    Prior to Admission medications   Medication Sig Start Date End Date Taking? Authorizing Provider  atorvastatin (LIPITOR) 40 MG tablet Take 1 tablet (40 mg total) by mouth daily. 04/30/23   Hoy Register, MD  Blood Glucose Monitoring Suppl (AGAMATRIX PRESTO) w/Device KIT Check sugars before meals twice sugars 08/17/16   Julieanne Manson, MD  dicyclomine (BENTYL) 20 MG tablet Take 1 tablet (20 mg total) by mouth 3 (three) times daily as needed for spasms (Take 1-3 times daily around time of significant abdominal cramping/pain). 01/15/23   Mansouraty, Netty Starring., MD  famotidine (PEPCID) 20 MG tablet Take 1 tablet (20 mg total) by mouth at bedtime. Patient not taking: Reported on 12/23/2022 11/25/22   Hoy Register, MD  glipiZIDE (GLUCOTROL) 10 MG tablet Take 1 tablet (10 mg total) by mouth  2 (two) times daily before a meal. 11/25/22   Hoy Register, MD  glucose blood (AGAMATRIX PRESTO TEST) test strip Check sugars twice daily before meals 08/29/17   Julieanne Manson, MD  hydrochlorothiazide (HYDRODIURIL) 25 MG tablet Take 1 tablet (25 mg total) by mouth daily. 04/30/23   Hoy Register, MD  insulin glargine (LANTUS SOLOSTAR) 100 UNIT/ML Solostar Pen Inject 8 Units into the skin at bedtime. 11/25/22   Hoy Register, MD  Insulin Pen Needle 31G X 5 MM MISC 1 each by Does not apply route at bedtime. 11/25/22   Hoy Register, MD  lisinopril (ZESTRIL) 5 MG tablet 1 tab by mouth daily with HCTZ 04/30/23   Hoy Register, MD  metFORMIN (GLUCOPHAGE) 1000 MG tablet TAKE 1 TABLET BY MOUTH TWICE DAILY WITH A MEAL 04/30/23   Newlin, Odette Horns, MD  ondansetron (ZOFRAN-ODT) 4 MG disintegrating tablet Take 1 tablet (4 mg total) by mouth every 8 (eight) hours as needed for nausea or vomiting. 04/09/23   Zenia Resides, MD  pantoprazole (PROTONIX) 40 MG tablet Take 1 tablet (40 mg total) by mouth 2 (two) times daily before a meal. 01/15/23   Mansouraty, Netty Starring., MD  Simethicone 80 MG TABS Take 1 tablet (80 mg total) by mouth in the morning, at noon, and at bedtime. Patient not taking: Reported on 12/23/2022 04/25/20   Hoy Register, MD  sucralfate (CARAFATE) 1 g tablet Take 1 tablet (1 g total) by mouth 2 (two) times daily. 04/09/23   Zenia Resides, MD    Family History Family History  Problem Relation Age of Onset   Diabetes Mother    Prostate cancer Father    Uterine cancer Sister 18   Cancer Sister        colon cancer as well age 99?   Colon cancer Sister    Alcohol abuse Brother    Cirrhosis Brother        cause of death   Uterine cancer Maternal Aunt    Breast cancer Neg Hx    Rectal cancer Neg Hx    Stomach cancer Neg Hx    Esophageal cancer Neg Hx     Social History Social History   Tobacco Use   Smoking status: Never   Smokeless tobacco: Never  Vaping Use    Vaping status: Never Used  Substance Use Topics   Alcohol use: No   Drug use: No     Allergies   Patient has no known allergies.   Review of Systems Review of Systems As per HPI  Physical Exam Triage Vital Signs ED Triage Vitals  Encounter Vitals Group     BP 06/14/23 0825 Marland Kitchen)  87/55     Systolic BP Percentile --      Diastolic BP Percentile --      Pulse Rate 06/14/23 0825 (!) 135     Resp 06/14/23 0825 16     Temp 06/14/23 0825 98.3 F (36.8 C)     Temp Source 06/14/23 0825 Oral     SpO2 06/14/23 0825 94 %     Weight 06/14/23 0824 143 lb (64.9 kg)     Height 06/14/23 0824 5\' 4"  (1.626 m)     Head Circumference --      Peak Flow --      Pain Score 06/14/23 0823 7     Pain Loc --      Pain Education --      Exclude from Growth Chart --    No data found.  Updated Vital Signs BP (!) 87/59 (BP Location: Left Arm)   Pulse (!) 137   Temp 98.3 F (36.8 C) (Oral)   Resp 16   Ht 5\' 4"  (1.626 m)   Wt 64.9 kg   LMP  (LMP Unknown)   SpO2 94%   BMI 24.55 kg/m   Visual Acuity Right Eye Distance:   Left Eye Distance:   Bilateral Distance:    Right Eye Near:   Left Eye Near:    Bilateral Near:     Physical Exam Vitals reviewed.  Constitutional:      General: She is in acute distress.     Appearance: She is ill-appearing.  Cardiovascular:     Rate and Rhythm: Normal rate and regular rhythm.     Pulses: Normal pulses.     Heart sounds: Normal heart sounds.  Pulmonary:     Effort: Pulmonary effort is normal.     Breath sounds: Normal breath sounds.  Abdominal:     Tenderness: There is no abdominal tenderness. There is right CVA tenderness. There is no guarding or rebound.     Hernia: No hernia is present.      UC Treatments / Results  Labs (all labs ordered are listed, but only abnormal results are displayed) Labs Reviewed  POCT URINALYSIS DIP (MANUAL ENTRY) - Abnormal; Notable for the following components:      Result Value   Clarity, UA turbid  (*)    Bilirubin, UA small (*)    Ketones, POC UA moderate (40) (*)    Blood, UA moderate (*)    Protein Ur, POC >=300 (*)    Leukocytes, UA Small (1+) (*)    All other components within normal limits    EKG   Radiology No results found.  Procedures Procedures (including critical care time)  Medications Ordered in UC Medications  sodium chloride 0.9 % bolus 1,000 mL (1,000 mLs Intravenous Bolus from Bag 06/14/23 0941)    Initial Impression / Assessment and Plan / UC Course  I have reviewed the triage vital signs and the nursing notes.  Pertinent labs & imaging results that were available during my care of the patient were reviewed by me and considered in my medical decision making (see chart for details).     1.  Sepsis secondary to acute pyelonephritis Normal saline fluid bolus IV access placed Patient will be sent to the ED for further evaluation and hospitalization Point-of-care urinalysis is positive for leukocyte Estrace, ketones and blood Final Clinical Impressions(s) / UC Diagnoses   Final diagnoses:  Sepsis secondary to UTI [facility])  Acute pyelonephritis     Discharge Instructions  We will send you to the emergency department via EMS because you have kidney infection.   ED Prescriptions   None    PDMP not reviewed this encounter.   Merrilee Jansky, MD 06/14/23 [phone]

## 2023-06-14 NOTE — ED Triage Notes (Signed)
Patient here today with c/o LB pain, fever, vomiting when she coughs, and abd pain X 3 days. She has been taking IBU and Motrin with no relief.

## 2023-06-14 NOTE — Discharge Instructions (Addendum)
We will send you to the emergency department via EMS because you have kidney infection.

## 2023-06-14 NOTE — Discharge Instructions (Addendum)
Utilice ibuprofeno o Tylenol en casa para Human resources officer y la fiebre. Puede tomar el medicamento para las nuseas que le recet hasta cada 6 horas para Paramedic las nuseas y los vmitos persistentes. Tome tambin todo el tratamiento con antibiticos que le hemos recetado para tratar la infeccin del tracto urinario que se observ en su evaluacin. Realice un seguimiento con su mdico de atencin primaria para asegurarse de que sus sntomas estn mejorando.

## 2023-06-14 NOTE — ED Provider Triage Note (Signed)
Emergency Medicine Provider Triage Evaluation Note  Laura Douglas , a 62 y.o. female  was evaluated in triage.  Pt complains of right flank pain for the last 3 days. Reports subjective fever. Went to UC and was transferred to the ED. Also reports cough and post-tussive nausea along with HA. She went to [facility] and was transferred to the ED for further workup.   Review of Systems  Positive: Dysuria, right flank pain, subjective fever Negative: CP, SOB, syncope  Physical Exam  LMP  (LMP Unknown)  Gen:   Awake, no distress   Resp:  Normal effort  MSK:   Moves extremities without difficulty  Other:  No focal tenderness in the abdomen. No peritonitis.   Medical Decision Making  Medically screening exam initiated at 10:02 AM.  Appropriate orders placed.  MARSHAI OKUNO was informed that the remainder of the evaluation will be completed by another provider, this initial triage assessment does not replace that evaluation, and the importance of remaining in the ED until their evaluation is complete.  Continue IVF and have initiated workup. Mild tachycardia but no fever or other SIRS vitals. No evidence of sepsis at this time.    Maia Plan, MD 06/14/23 1014

## 2023-06-14 NOTE — ED Triage Notes (Signed)
Patient arrived by carelink from [facility] with complaint of right sided flank pain, cough, nausea and dysuria x 3 days. Patient arrived with IV NS infusing due to BP being in 80s and 90s and HR 120. Patient reports hx of kidney stones Patient alert and oriented -

## 2023-06-17 ENCOUNTER — Ambulatory Visit: Payer: 59 | Admitting: Physician Assistant

## 2023-06-17 LAB — URINE CULTURE: Culture: >=100000 — AB

## 2023-06-18 ENCOUNTER — Telehealth (HOSPITAL_BASED_OUTPATIENT_CLINIC_OR_DEPARTMENT_OTHER): Payer: Self-pay | Admitting: *Deleted

## 2023-06-18 NOTE — Telephone Encounter (Signed)
 Post ED Visit - Positive Culture Follow-up  Culture report reviewed by antimicrobial stewardship pharmacist: [facility] [x]  Leonor Bash [place].D. []  Venetia Gully, Pharm.D., BCPS AQ-ID []  Garrel Crews, Pharm.D., BCPS []  Almarie Lunger, [address].D., BCPS []  [place], [address].D., BCPS, AAHIVP []  Rosaline Bihari, Pharm.D., BCPS, AAHIVP []  Vernell Meier, PharmD, BCPS []  Latanya Hint, PharmD, BCPS []  Donald Medley, PharmD, BCPS []  Rocky Bold, PharmD []  Dorothyann Alert, PharmD, BCPS []  Morene Babe, PharmD  [facility] []  Rosaline Edison, PharmD []  Romona Bliss, PharmD []  Dolphus Roller, PharmD []  Veva Seip, Rph []  Vernell Daunt) Leonce, PharmD []  Eva Allis, PharmD []  Rosaline Millet, PharmD []  Iantha Batch, PharmD []  Arvin Gauss, PharmD []  Wanda Hasting, PharmD []  Ronal Rav, PharmD []  Rocky Slade, PharmD []  Bard Jeans, PharmD   Positive urine culture Treated with Cefadroxil , organism sensitive to the same and no further patient follow-up is required at this time.  Jama Lisle Blondie 06/18/2023, 10:27 AM

## 2023-07-01 ENCOUNTER — Encounter: Payer: Self-pay | Admitting: Physician Assistant

## 2023-07-01 ENCOUNTER — Ambulatory Visit: Payer: Self-pay | Attending: Physician Assistant | Admitting: Physician Assistant

## 2023-07-01 VITALS — BP 125/81 | HR 100 | Resp 20 | Ht 64.0 in | Wt 137.6 lb

## 2023-07-01 DIAGNOSIS — Z603 Acculturation difficulty: Secondary | ICD-10-CM

## 2023-07-01 DIAGNOSIS — E1165 Type 2 diabetes mellitus with hyperglycemia: Secondary | ICD-10-CM

## 2023-07-01 DIAGNOSIS — Z758 Other problems related to medical facilities and other health care: Secondary | ICD-10-CM

## 2023-07-01 DIAGNOSIS — Z91199 Patient's noncompliance with other medical treatment and regimen due to unspecified reason: Secondary | ICD-10-CM

## 2023-07-01 DIAGNOSIS — I10 Essential (primary) hypertension: Secondary | ICD-10-CM

## 2023-07-01 DIAGNOSIS — E1169 Type 2 diabetes mellitus with other specified complication: Secondary | ICD-10-CM

## 2023-07-01 DIAGNOSIS — Z7984 Long term (current) use of oral hypoglycemic drugs: Secondary | ICD-10-CM

## 2023-07-01 DIAGNOSIS — N898 Other specified noninflammatory disorders of vagina: Secondary | ICD-10-CM

## 2023-07-01 DIAGNOSIS — E119 Type 2 diabetes mellitus without complications: Secondary | ICD-10-CM

## 2023-07-01 DIAGNOSIS — Z794 Long term (current) use of insulin: Secondary | ICD-10-CM

## 2023-07-01 DIAGNOSIS — E785 Hyperlipidemia, unspecified: Secondary | ICD-10-CM

## 2023-07-01 LAB — POCT URINALYSIS DIP (CLINITEK)
Bilirubin, UA: NEGATIVE
Glucose, UA: 500 mg/dL — AB
Ketones, POC UA: NEGATIVE mg/dL
Leukocytes, UA: NEGATIVE
Nitrite, UA: NEGATIVE
POC PROTEIN,UA: NEGATIVE
Spec Grav, UA: 1.01 (ref 1.010–1.025)
Urobilinogen, UA: 0.2 U/dL
pH, UA: 6.5 (ref 5.0–8.0)

## 2023-07-01 LAB — GLUCOSE, POCT (MANUAL RESULT ENTRY): POC Glucose: 296 mg/dL — AB (ref 70–99)

## 2023-07-01 LAB — POCT GLYCOSYLATED HEMOGLOBIN (HGB A1C): Hemoglobin A1C: 11.6 % — AB (ref 4.0–5.6)

## 2023-07-01 MED ORDER — HYDROCHLOROTHIAZIDE 25 MG PO TABS: 25.0000 mg | ORAL_TABLET | Freq: Every day | ORAL | 0 refills | Status: DC

## 2023-07-01 MED ORDER — LISINOPRIL 5 MG PO TABS: ORAL_TABLET | ORAL | 0 refills | Status: DC

## 2023-07-01 MED ORDER — FLUCONAZOLE 150 MG PO TABS: 150.0000 mg | ORAL_TABLET | Freq: Once | ORAL | 0 refills | Status: AC

## 2023-07-01 MED ORDER — ATORVASTATIN CALCIUM 40 MG PO TABS: 40.0000 mg | ORAL_TABLET | Freq: Every day | ORAL | 0 refills | Status: DC

## 2023-07-01 MED ORDER — GLIPIZIDE 10 MG PO TABS: 10.0000 mg | ORAL_TABLET | Freq: Two times a day (BID) | ORAL | 1 refills | Status: DC

## 2023-07-01 MED ORDER — METFORMIN HCL 1000 MG PO TABS: ORAL_TABLET | ORAL | 0 refills | Status: DC

## 2023-07-01 NOTE — Progress Notes (Signed)
Patient ID: Laura Douglas, female   DOB: [birth year], 63 y.o.   MRN: [id number]    Laura Douglas, is a 63 y.o. female  [id number]  [id number]  DOB - [birth year]  Chief Complaint  Patient presents with   Medical Management of Chronic Issues       Subjective:   Laura Douglas is a 63 y.o. female here today for Here for diabetes check, vaginal itching and burning with urination.  She has not been taking lantus and says she isn't supposed to be on insulin although it was previously prescribed.  She does not check blood sugars daily but says she checks it and it is always "high" but unable to tell me any numbers.  No flank pain.  Does not follow diabetic diet.  She reports taking metformin 1000 mg bid and taking 1/2 glipizide bid.    No problems updated.  ALLERGIES: No Known Allergies  PAST MEDICAL HISTORY: Past Medical History:  Diagnosis Date   Blood transfusion without reported diagnosis    Colon cancer (HCC)    possible? Patient had surgery in [place]. Denies needing adjuvant therapy. The surgery "removed a tumor from the colon".   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    MEDICATIONS AT HOME: Prior to Admission medications   Medication Sig Start Date End Date Taking? Authorizing Provider  Blood Glucose Monitoring Suppl (AGAMATRIX PRESTO) w/Device KIT Check sugars before meals twice sugars 08/17/16  Yes Julieanne Manson, MD  cefadroxil (DURICEF) 500 MG capsule Take 1 capsule (500 mg total) by mouth 2 (two) times daily. 06/14/23  Yes Prosperi, Christian H, PA-C  fluconazole (DIFLUCAN) 150 MG tablet Take 1 tablet (150 mg total) by mouth once for 1 dose. For yeast.  Repeat after 1 week. 07/01/23 07/01/23 Yes Autumne Kallio, Marzella Schlein, PA-C  glucose blood  (AGAMATRIX PRESTO TEST) test strip Check sugars twice daily before meals 08/29/17  Yes Julieanne Manson, MD  pantoprazole (PROTONIX) 40 MG tablet Take 1 tablet (40 mg total) by mouth 2 (two) times daily before a meal. 01/15/23  Yes Mansouraty, Netty Starring., MD  Simethicone 80 MG TABS Take 1 tablet (80 mg total) by mouth in the morning, at noon, and at bedtime. 04/25/20  Yes Hoy Register, MD  sucralfate (CARAFATE) 1 g tablet Take 1 tablet (1 g total) by mouth 2 (two) times daily. 04/09/23  Yes Zenia Resides, MD  atorvastatin (LIPITOR) 40 MG tablet Take 1 tablet (40 mg total) by mouth daily. 07/01/23   Anders Simmonds, PA-C  dicyclomine (BENTYL) 20 MG tablet Take 1 tablet (20 mg total) by mouth 3 (three) times daily as needed for spasms (Take 1-3 times daily around time of significant abdominal cramping/pain). Patient not taking: Reported on 07/01/2023 01/15/23   Mansouraty, Netty Starring., MD  famotidine (PEPCID) 20 MG tablet Take 1 tablet (20 mg total) by mouth at bedtime. Patient not taking: Reported on 07/01/2023 11/25/22   Hoy Register, MD  glipiZIDE (GLUCOTROL) 10 MG tablet Take 1 tablet (10 mg total) by mouth 2 (two) times daily before a meal. 07/01/23   Trejuan Matherne, Marzella Schlein, PA-C  hydrochlorothiazide (HYDRODIURIL) 25 MG tablet Take 1 tablet (25 mg total) by mouth daily. 07/01/23   Georgian Co  M, PA-C  lisinopril (ZESTRIL) 5 MG tablet 1 tab by mouth daily with HCTZ 07/01/23   Anders Simmonds, PA-C  metFORMIN (GLUCOPHAGE) 1000 MG tablet TAKE 1 TABLET BY MOUTH TWICE DAILY WITH A MEAL 07/01/23   Jw Covin, Marylene Land M, PA-C  ondansetron (ZOFRAN-ODT) 4 MG disintegrating tablet Take 1 tablet (4 mg total) by mouth every 6 (six) hours as needed for nausea or vomiting. Patient not taking: Reported on 07/01/2023 06/14/23   Prosperi, Christian H, PA-C    ROS: Neg HEENT Neg resp Neg cardiac Neg GI Neg GU Neg MS Neg psych Neg neuro  Objective:   Vitals:   07/01/23 1053  BP: 125/81  Pulse: 100   Resp: 20  SpO2: 98%  Weight: 137 lb 9.6 oz (62.4 kg)  Height: 5\' 4"  (1.626 m)   Exam General appearance : Awake, alert, not in any distress. Speech Clear. Not toxic looking HEENT: Atraumatic and Normocephalic Neck: Supple, no JVD. No cervical lymphadenopathy.  Chest: Good air entry bilaterally, CTAB.  No rales/rhonchi/wheezing CVS: S1 S2 regular, no murmurs.  Extremities: B/L Lower Ext shows no edema, both legs are warm to touch Neurology: Awake alert, and oriented X 3, CN II-XII intact, Non focal Skin: No Rash  Data Review Lab Results  Component Value Date   HGBA1C 11.6 (A) 07/01/2023   HGBA1C 10.4 (A) 11/25/2022   HGBA1C 8.5 (A) 07/06/2022    Assessment & Plan   1. Type 2 diabetes mellitus with hyperglycemia, with long-term current use of insulin (HCC) (Primary) Uncontrolled.  Will increase her glipizide since she was only taking 5mg .  CMP in 3 weeks please drink 80 ounces water daily to help prevent urinary tract infection.   eliminate sugars, juices, sweets and carbohydrates from your diet.  I am increasing the dose of your glipizide to 10mg  twice daily.  continue metformin 1000mg  twice daily.   check your blood sugars fasting and bedtime and record them and bring this to your next visit - Glucose (CBG) - HgB A1c - Urine Albumin/Creatinine with ratio (send out) [LAB689]   2. Long term current use of oral hypoglycemic drug  4. Hyperlipidemia associated with type 2 diabetes mellitus (HCC) - atorvastatin (LIPITOR) 40 MG tablet; Take 1 tablet (40 mg total) by mouth daily.  Dispense: 90 tablet; Refill: 0  5. Hypertension complicating diabetes (HCC) - hydrochlorothiazide (HYDRODIURIL) 25 MG tablet; Take 1 tablet (25 mg total) by mouth daily.  Dispense: 90 tablet; Refill: 0 - lisinopril (ZESTRIL) 5 MG tablet; 1 tab by mouth daily with HCTZ  Dispense: 90 tablet; Refill: 0  6. Noncompliance I am doubting compliance although she reports 100% compliance.    7. Language  barrier [employer] "Berna Spare" interpreters used and additional time performing visit was required.   8. Vaginal itching Likely secondary to #1- fluconazole (DIFLUCAN) 150 MG tablet; Take 1 tablet (150 mg total) by mouth once for 1 dose. For yeast.  Repeat after 1 week.  Dispense: 1 tablet; Refill: 0 - POCT URINALYSIS DIP (CLINITEK)-no UTI today    Return in about 3 weeks (around 07/22/2023) for Luke-for DM in 3 weeks, PCP appt in 3 months-Dr Newlin.  The patient was given clear instructions to go to ER or return to medical center if symptoms don't improve, worsen or new problems develop. The patient verbalized understanding. The patient was told to call to get lab results if they haven't heard anything in the next week.      Georgian Co, PA-C [facility] [place], [place] [phone]   07/01/2023, 11:56 AM

## 2023-07-01 NOTE — Patient Instructions (Signed)
beba 80 onzas de agua al da para ayudar a prevenir infecciones del tracto urinario.    Elimina de tu dieta azcares, jugos, dulces y carbohidratos.  Estoy aumentando la dosis de glipizida a 10 mg [employer].  Continuar con metformina 1000 mg [employer].    controle sus niveles de azcar en sangre en ayunas y antes de [place], regstrelos y Advertising account planner a su prxima visita

## 2023-07-01 NOTE — Progress Notes (Signed)
 Amn interpreter

## 2023-07-04 LAB — MICROALBUMIN / CREATININE URINE RATIO
Creatinine, Urine: 24.8 mg/dL
Microalb/Creat Ratio: 20 mg/g{creat} (ref 0–29)
Microalbumin, Urine: 4.9 ug/mL

## 2023-07-21 ENCOUNTER — Other Ambulatory Visit: Payer: Self-pay

## 2023-07-21 NOTE — Progress Notes (Addendum)
 S:     No chief complaint on file.  63 y.o. female with PMHx significant for HTN, GERD, T2DM, HLD, hepatic steatosis, and history of uterine cancer/endometrial/colon cancer who presents for diabetes evaluation, education, and management.   She was referred and last seen by Jon Moores, PA-C on 07/01/2023.  A1c was found to be 11.6% increased from last reading in June 2024 of 10.4%. During this visit she admitted to dietary indiscretion. McClung noted questionable medication compliance.  Today patient states her BG has ranged from 280s-340s. Never above 400. Her most recent reading was 187 after lunch. She checks her blood sugar twice daily, usually after meals. She was on basal insulin  5 months ago (Lantus ), but experienced headaches and weight loss while on this medication. She has remained adherent to glipizide  10 mg BID and metformin  1000 BID.   Family/Social History: No  personal or family history of thyroid cancers/disease   Current diabetes medications include:  -Glipizide  10 mg BID  -Metformin  1000 mg BID with meals   Past diabetes medications:  -Invokana  100 mg every day -Farxiga  10 mg every day -Lantus  8 units daily (last OV patient stated she is not supposed to be on insulin  therapy; unclear reason for discontinuation; last dispensed August 2024)   Current hypertension medications include:  -Lisinopril  5 mg every day -Hydrochlorothiazide  25 mg daily (due for refill-last dispensed 06/06/2023 for 30 ds)  Current hyperlipidemia medications include:  -Atorvastatin  40 mg every day (due for refill-last dispensed 06/06/2023 for 30 ds)   Patient denies hypoglycemic events.   Patient reports nocturia (nighttime urination). 4 times per night (this is a newer issue) Patient reports neuropathy (nerve pain). Dealing with this for 5 years, has been improving recently.  Patient denies visual changes. Patient denies self foot exams.   Patient reported dietary habits: Eats 3  meals/day Breakfast: eggs, tortillas sometimes cereal and milk  Lunch: chicken, salad, may be soups  Dinner: fruits, tortillas, beans, eggs Snacks: peanuts (unsalted) Drinks: 3 bottles of water /day; orange or grapefruit juice occasionally, 1 glass with a meal at most  Patient-reported exercise habits: treadmill for 120 minutes Monday-Friday   O:    Lab Results  Component Value Date   HGBA1C 11.6 (A) 07/01/2023   There were no vitals filed for this visit.  Lipid Panel     Component Value Date/Time   CHOL 157 01/02/2022 0847   TRIG 109 01/02/2022 0847   HDL 52 01/02/2022 0847   CHOLHDL 3.0 10/18/2020 0957   CHOLHDL 6.7 (H) 06/04/2015 0925   VLDL 67 (H) 06/04/2015 0925   LDLCALC 85 01/02/2022 0847    Clinical Atherosclerotic Cardiovascular Disease (ASCVD): No  The 10-year ASCVD risk score (Arnett DK, et al., 2019) is: 7.7%   Values used to calculate the score:     Age: 63 years     Sex: Female     Is Non-Hispanic African American: No     Diabetic: Yes     Tobacco smoker: No     Systolic Blood Pressure: 125 mmHg     Is BP treated: Yes     HDL Cholesterol: 52 mg/dL     Total Cholesterol: 157 mg/dL   Patient is participating in a Managed Medicaid Plan:  No   A/P: Diabetes longstanding currently uncontrolled. A1c is 11.6, increased from 10.4 in June 2024 (goal <7).  Control is suboptimal due to sub-optimal therapy and poor medication adherence. No fill history of glipizide  from August 2024 to January 2025.  Patient has since restarted. She reported adherence to Metformin  1000 mg BID and Glipizide  10 mg BID.  -Due to concern of hyperglycemia (increased nocturia, BG 280s-300s after meals) we have elected to re-start basal insulin  therapy and initiate GLP-1 agonist for further blood sugar control. Patient was counseled on proper insulin  and GLP-1 agonist administration technique -Restarted basal insulin  10 units Basaglar  (insulin  glargine). If headaches return she is to call the  clinic.  -Started GLP-1 agonist Trulicity  (dulaglutide ) 0.75 mg weekly  -Continued metformin  1000 mg BID. -Continued Glipizide  10 mg BID  -Patient educated on purpose, proper use, and potential adverse effects of Trulicity .  -Extensively discussed pathophysiology of diabetes, recommended lifestyle interventions, dietary effects on blood sugar control.  -Counseled on s/sx of and management of hypoglycemia.  -Next A1c anticipated April 2025.   ASCVD risk - primary prevention in patient with diabetes. Last LDL is 85 not at goal of <70  mg/dL. ASCVD risk factors include T2DM and 10-year ASCVD risk score of 7.7%. moderate intensity statin indicated.  -Continued Atorvastatin  40 mg daily.   Follow-up: Return to pharmacy clinic in 1 month to assess blood sugar control on Trulicity  and basal insulin    Tolu Quynn Vilchis, PharmD [employer] PGY-1

## 2023-07-22 ENCOUNTER — Encounter: Payer: Self-pay | Admitting: Pharmacist

## 2023-07-22 ENCOUNTER — Other Ambulatory Visit: Payer: Self-pay

## 2023-07-22 ENCOUNTER — Ambulatory Visit: Payer: Self-pay | Attending: Family Medicine | Admitting: Pharmacist

## 2023-07-22 DIAGNOSIS — K219 Gastro-esophageal reflux disease without esophagitis: Secondary | ICD-10-CM

## 2023-07-22 DIAGNOSIS — E785 Hyperlipidemia, unspecified: Secondary | ICD-10-CM

## 2023-07-22 DIAGNOSIS — E1165 Type 2 diabetes mellitus with hyperglycemia: Secondary | ICD-10-CM

## 2023-07-22 DIAGNOSIS — Z794 Long term (current) use of insulin: Secondary | ICD-10-CM

## 2023-07-22 DIAGNOSIS — I10 Essential (primary) hypertension: Secondary | ICD-10-CM

## 2023-07-22 DIAGNOSIS — Z7984 Long term (current) use of oral hypoglycemic drugs: Secondary | ICD-10-CM

## 2023-07-22 DIAGNOSIS — E1169 Type 2 diabetes mellitus with other specified complication: Secondary | ICD-10-CM

## 2023-07-22 DIAGNOSIS — E119 Type 2 diabetes mellitus without complications: Secondary | ICD-10-CM

## 2023-07-22 MED ORDER — ATORVASTATIN CALCIUM 40 MG PO TABS
40.0000 mg | ORAL_TABLET | Freq: Every day | ORAL | 0 refills | Status: DC
  Filled 2023-07-22 – 2023-08-09 (×2): qty 30, 30d supply, fill #0

## 2023-07-22 MED ORDER — LISINOPRIL 5 MG PO TABS
5.0000 mg | ORAL_TABLET | Freq: Every day | ORAL | 0 refills | Status: DC
  Filled 2023-07-22: qty 30, 30d supply, fill #0

## 2023-07-22 MED ORDER — BASAGLAR KWIKPEN 100 UNIT/ML ~~LOC~~ SOPN
10.0000 [IU] | PEN_INJECTOR | Freq: Every day | SUBCUTANEOUS | 2 refills | Status: DC
  Filled 2023-07-22: qty 3, 28d supply, fill #0
  Filled 2023-07-22: qty 3, 30d supply, fill #0

## 2023-07-22 MED ORDER — PANTOPRAZOLE SODIUM 40 MG PO TBEC
40.0000 mg | DELAYED_RELEASE_TABLET | Freq: Two times a day (BID) | ORAL | 3 refills | Status: DC
  Filled 2023-07-22: qty 60, 30d supply, fill #0

## 2023-07-22 MED ORDER — TRULICITY 0.75 MG/0.5ML ~~LOC~~ SOAJ
0.7500 mg | SUBCUTANEOUS | 1 refills | Status: DC
  Filled 2023-07-22 – 2023-08-09 (×2): qty 2, 28d supply, fill #0

## 2023-07-22 MED ORDER — METFORMIN HCL 1000 MG PO TABS
1000.0000 mg | ORAL_TABLET | Freq: Two times a day (BID) | ORAL | 0 refills | Status: DC
  Filled 2023-07-22: qty 60, 30d supply, fill #0

## 2023-07-22 MED ORDER — HYDROCHLOROTHIAZIDE 25 MG PO TABS
25.0000 mg | ORAL_TABLET | Freq: Every day | ORAL | 0 refills | Status: DC
  Filled 2023-07-22: qty 30, 30d supply, fill #0

## 2023-07-23 ENCOUNTER — Other Ambulatory Visit: Payer: Self-pay

## 2023-07-24 LAB — LIPID PANEL
Chol/HDL Ratio: 3.9 {ratio} (ref 0.0–4.4)
Cholesterol, Total: 159 mg/dL (ref 100–199)
HDL: 41 mg/dL (ref >39)
LDL Chol Calc (NIH): 79 mg/dL (ref 0–99)
Triglycerides: 235 mg/dL — ABNORMAL HIGH (ref 0–149)
VLDL Cholesterol Cal: 39 mg/dL (ref 5–40)

## 2023-07-24 LAB — MICROALBUMIN / CREATININE URINE RATIO
Creatinine, Urine: 110.4 mg/dL
Microalb/Creat Ratio: 19 mg/g{creat} (ref 0–29)
Microalbumin, Urine: 21.3 ug/mL

## 2023-07-26 ENCOUNTER — Other Ambulatory Visit: Payer: Self-pay

## 2023-07-26 ENCOUNTER — Telehealth: Payer: Self-pay

## 2023-07-26 NOTE — Telephone Encounter (Signed)
 Pharmacy Patient Advocate Encounter  Received notification from [employer] that Prior Authorization for TRULICITY  has been APPROVED from 07/26/2023 to 07/25/2024   PA #/Case ID/Reference #: [id number]

## 2023-08-04 ENCOUNTER — Other Ambulatory Visit: Payer: Self-pay

## 2023-08-09 ENCOUNTER — Other Ambulatory Visit: Payer: Self-pay

## 2023-08-18 ENCOUNTER — Other Ambulatory Visit: Payer: Self-pay

## 2023-08-26 ENCOUNTER — Ambulatory Visit: Payer: Self-pay | Attending: Family Medicine | Admitting: Pharmacist

## 2023-08-26 ENCOUNTER — Encounter: Payer: Self-pay | Admitting: Pharmacist

## 2023-08-26 DIAGNOSIS — E1165 Type 2 diabetes mellitus with hyperglycemia: Secondary | ICD-10-CM

## 2023-08-26 DIAGNOSIS — Z794 Long term (current) use of insulin: Secondary | ICD-10-CM

## 2023-08-26 DIAGNOSIS — Z7984 Long term (current) use of oral hypoglycemic drugs: Secondary | ICD-10-CM

## 2023-08-26 DIAGNOSIS — Z7985 Long-term (current) use of injectable non-insulin antidiabetic drugs: Secondary | ICD-10-CM

## 2023-08-26 MED ORDER — TRULICITY 1.5 MG/0.5ML ~~LOC~~ SOAJ: 1.5000 mg | SUBCUTANEOUS | 3 refills | Status: DC

## 2023-08-26 NOTE — Progress Notes (Signed)
 Interpreter used for this visit: ID #[id number], Verdon Cummins   S:     No chief complaint on file.  63 y.o. female with PMHx significant for HTN, GERD, T2DM, HLD, hepatic steatosis, and history of uterine cancer/endometrial/colon cancer who presents for diabetes evaluation, education, and management.   She was referred and last seen by Georgian Co, PA-C on 07/01/2023.  A1c was found to be 11.6%. During that visit she admitted to dietary indiscretion. McClung noted questionable medication compliance as well. We saw her 07/22/2023 and restarted Basaglar. We also added Trulicity.   Today, pt reports doing well. Has started the Trulicity. Denies any NV, abdominal pain. Reports adherence to this and her insulin. She is feeling much better since her last visit. She checks her blood sugar twice daily, usually after meals.  Family/Social History: No  personal or family history of thyroid cancers/disease   Current diabetes medications include:  -Basaglar 10 units  -Glipizide 10 mg BID  -Metformin 1000 mg BID with meals  -Trulicity 0.75 mg weekly  Patient denies hypoglycemic events.  Patient denies nocturia (nighttime urination).  Patient reports stable neuropathy (nerve pain) with no recent changes. Patient denies visual changes. Patient denies self foot exams.   Patient reported dietary habits: Eats 3 meals/day Breakfast: eggs, tortillas sometimes cereal and milk  Lunch: chicken, salad, may be soups  Dinner: fruits, tortillas, beans, eggs Snacks: peanuts (unsalted) Drinks: 3 bottles of water/day; orange or grapefruit juice occasionally, 1 glass with a meal at most  Patient-reported exercise habits: treadmill for 120 minutes Monday-Friday  Patient-reported home glucose levels:  - Most recent glucose readings: recalls 158, 148. - Denies any readings >200.  - Denies any readings < 70. Only one reading 90.   O:  Lab Results  Component Value Date   HGBA1C 11.6 (A) 07/01/2023   There were  no vitals filed for this visit.  Lipid Panel     Component Value Date/Time   CHOL 159 07/22/2023 1227   TRIG 235 (H) 07/22/2023 1227   HDL 41 07/22/2023 1227   CHOLHDL 3.9 07/22/2023 1227   CHOLHDL 6.7 (H) 06/04/2015 0925   VLDL 67 (H) 06/04/2015 0925   LDLCALC 79 07/22/2023 1227    Clinical Atherosclerotic Cardiovascular Disease (ASCVD): No  The 10-year ASCVD risk score (Arnett DK, et al., 2019) is: 9%   Values used to calculate the score:     Age: 39 years     Sex: Female     Is Non-Hispanic African American: No     Diabetic: Yes     Tobacco smoker: No     Systolic Blood Pressure: 125 mmHg     Is BP treated: Yes     HDL Cholesterol: 41 mg/dL     Total Cholesterol: 159 mg/dL   Patient is participating in a Managed Medicaid Plan:  No   A/P: Diabetes longstanding currently uncontrolled, however, reported home readings are improving. Medication adherence is improving as well. Her symptoms of hyperglycemia have improved. She is not currently hypoglycemic but can verbalize appropriate hypoglycemia management plan. -Continue Basaglar 10 units -Increased Trulicity to 1.5 mg weekly  -Continued metformin 1000 mg BID. -Continued Glipizide 10 mg BID  -Patient educated on purpose, proper use, and potential adverse effects of Trulicity.  -Extensively discussed pathophysiology of diabetes, recommended lifestyle interventions, dietary effects on blood sugar control.  -Counseled on s/sx of and management of hypoglycemia.  -Next A1c anticipated April 2025.   Follow-up: Return to pharmacy clinic in 1 month.  Butch Penny, PharmD, Patsy Baltimore, CPP Clinical Pharmacist [facility] & [facility] [phone]

## 2023-08-27 ENCOUNTER — Telehealth: Payer: Self-pay

## 2023-08-27 NOTE — Telephone Encounter (Signed)
 Pharmacy Patient Advocate Encounter   Received notification from CoverMyMeds that prior authorization for TRULICITY is required/requested.   Insurance verification completed.   The patient is insured through [employer] .   Per test claim: PA required; PA submitted to above mentioned insurance via CoverMyMeds Key/confirmation #/EOC [id number] Status is pending

## 2023-08-30 ENCOUNTER — Telehealth: Payer: Self-pay

## 2023-08-30 ENCOUNTER — Other Ambulatory Visit: Payer: Self-pay

## 2023-08-30 NOTE — Telephone Encounter (Signed)
 Pharmacy Patient Advocate Encounter  Received notification from [employer] that Prior Authorization for TRULICITY has been APPROVED from 08/27/2023 to 08/27/2024   PA #/Case ID/Reference #: [id number]  [id number] HAS BEEN NOTIFIED-REFILL TOO SOON, WILL PROCESS WHEN REFILL DUE 08/31/2023.

## 2023-09-30 ENCOUNTER — Ambulatory Visit: Payer: Self-pay | Attending: Family Medicine | Admitting: Pharmacist

## 2023-09-30 ENCOUNTER — Telehealth: Payer: Self-pay | Admitting: Pharmacist

## 2023-09-30 ENCOUNTER — Other Ambulatory Visit: Payer: Self-pay

## 2023-09-30 ENCOUNTER — Encounter: Payer: Self-pay | Admitting: Pharmacist

## 2023-09-30 DIAGNOSIS — E1165 Type 2 diabetes mellitus with hyperglycemia: Secondary | ICD-10-CM

## 2023-09-30 DIAGNOSIS — Z7984 Long term (current) use of oral hypoglycemic drugs: Secondary | ICD-10-CM

## 2023-09-30 DIAGNOSIS — Z7985 Long-term (current) use of injectable non-insulin antidiabetic drugs: Secondary | ICD-10-CM

## 2023-09-30 DIAGNOSIS — Z794 Long term (current) use of insulin: Secondary | ICD-10-CM

## 2023-09-30 LAB — POCT GLYCOSYLATED HEMOGLOBIN (HGB A1C): HbA1c, POC (controlled diabetic range): 9.4 % — AB (ref 0.0–7.0)

## 2023-09-30 MED ORDER — DAPAGLIFLOZIN PROPANEDIOL 10 MG PO TABS
10.0000 mg | ORAL_TABLET | Freq: Every day | ORAL | 6 refills | Status: DC
  Filled 2023-09-30: qty 30, 30d supply, fill #0

## 2023-09-30 NOTE — Telephone Encounter (Signed)
 Can we start a PA for this patient's Marcelline Deist?

## 2023-09-30 NOTE — Progress Notes (Signed)
 Interpreter used for this visit: ID #[id number], Collie Siad   S:     No chief complaint on file.  63 y.o. female with PMHx significant for HTN, GERD, T2DM, HLD, hepatic steatosis, and history of uterine cancer/endometrial/colon cancer who presents for diabetes evaluation, education, and management.   She was referred and last seen by Georgian Co, PA-C on 07/01/2023.  A1c was found to be 11.6%. Pharmacy last saw her 08/26/2023 and restarted Basaglar. We also added Trulicity. Most recently, I saw her last month and increased Trulicity to 1.5 mg weekly.   Today, pt reports doing well. Has been taking the increased dose of Trulicity. Denies any NV, abdominal pain. However, does endorse bloating and belching. This is not a problem for her at this time and she wishes to remain on the Trulicity. Reports adherence to this and her PO medications. She tells me she stopped Basaglar today and that I told her to during her last visit. I had documented and remember telling her to continue Basaglar in addition to the Trulicity.   Family/Social History: No  personal or family history of thyroid cancers/disease   Current diabetes medications include:  -Basaglar 10 units (not taking)  -Glipizide 10 mg BID  -Metformin 1000 mg BID with meals  -Trulicity 1.5 mg weekly  Patient denies hypoglycemic events.  Patient denies nocturia (nighttime urination).  Patient reports stable neuropathy (nerve pain) with no recent changes. Patient denies visual changes. Patient denies self foot exams.   Patient reported dietary habits: Eats 3 meals/day Breakfast: eggs, tortillas sometimes cereal and milk  Lunch: chicken, salad, may be soups  Dinner: fruits, tortillas, beans, eggs Snacks: peanuts (unsalted) Drinks: 3 bottles of water/day; orange or grapefruit juice occasionally, 1 glass with a meal at most  Patient-reported exercise habits: treadmill for 120 minutes Monday-Friday  Patient-reported home glucose levels:   - No meter with her today. - Denies any readings >200.  - Denies any readings < 70. Only one reading 90.   O:  Lab Results  Component Value Date   HGBA1C 9.4 (A) 09/30/2023   There were no vitals filed for this visit.  Lipid Panel     Component Value Date/Time   CHOL 159 07/22/2023 1227   TRIG 235 (H) 07/22/2023 1227   HDL 41 07/22/2023 1227   CHOLHDL 3.9 07/22/2023 1227   CHOLHDL 6.7 (H) 06/04/2015 0925   VLDL 67 (H) 06/04/2015 0925   LDLCALC 79 07/22/2023 1227    Clinical Atherosclerotic Cardiovascular Disease (ASCVD): No  The 10-year ASCVD risk score (Arnett DK, et al., 2019) is: 9%   Values used to calculate the score:     Age: 59 years     Sex: Female     Is Non-Hispanic African American: No     Diabetic: Yes     Tobacco smoker: No     Systolic Blood Pressure: 125 mmHg     Is BP treated: Yes     HDL Cholesterol: 41 mg/dL     Total Cholesterol: 159 mg/dL   Patient is participating in a Managed Medicaid Plan:  No   A/P: Diabetes longstanding currently uncontrolled, however, her A1c is down to 9.4% (from 11.6 in Jan of this year). Commended her on her improvement but emphasized the need for continued improvement. Medication adherence is okay. She does not wish to restart insulin at this time. We will continue Trulicity at it's current dosing given her GI symptoms. We can continue metformin and glipizide for now as  well. We will add Farxiga today. She is not currently hypoglycemic but can verbalize appropriate hypoglycemia management plan. -Continued Trulicity to 1.5 mg weekly  -Continued metformin 1000 mg BID -Continued Glipizide 10 mg BID  -Start Farxiga 10 mg daily. -Patient educated on purpose, proper use, and potential adverse effects of Farxiga.  -Extensively discussed pathophysiology of diabetes, recommended lifestyle interventions, dietary effects on blood sugar control.  -Counseled on s/sx of and management of hypoglycemia.  -Next A1c anticipated July 2025.    Follow-up: Return to pharmacy clinic in 1 month.  Marene Shape, PharmD, Becky Bowels, CPP Clinical Pharmacist [facility] [phone]

## 2023-10-12 ENCOUNTER — Other Ambulatory Visit: Payer: Self-pay

## 2023-11-04 NOTE — Progress Notes (Signed)
 Interpreter used for this visit: ID #[id number] [name]   S:     No chief complaint on file.  63 y.o. female with PMHx significant for HTN, GERD, T2DM, HLD, hepatic steatosis, and history of uterine cancer/endometrial/colon cancer who presents for diabetes evaluation, education, and management.    Last seen with pharmacist 09/30/2023 where patient reported bloating and belching but tolerating her increased dose of Trulicity  1.5 mg. Patient reported stopping basaglar  due to misunderstanding of instructions from the last visit. Patient reported stable neuropathy and was started on Farxiga  10 mg daily.   Today, patient reports continued adherence to Trulicity  1.5 mg weekly. She reports her last injection was last Friday and has one injection remaining. She reports while being on Trulicity  she has continued symptoms of upset stomach, stomach pain, a lot of gas, and diarrhea. Of note, she did not start Farxiga . Patient reports today that she did not know that she was starting Farxiga .   insurance: [facility]   Patient reports monitoring blood sugars at home with ranges 105 - 130 (highest she has seen it 180). She does not recall readings above 200.   Denies hypoglycemic events.  Patient reports nocturia (nighttime urination) (once a night)  Patient reports improvement in neuropathy Patient denies visual changes. Patient denies self foot exams.   Current diabetes medications include: Trulicity  1.5 mg weekly, glipizide  10 mg BID, metformin  1000 mg BID, Farxiga  10 mg daily (not taking).   Family/Social History: No  personal or family history of thyroid cancers/disease   Patient reported dietary habits: eats 3 meals/day but eating smaller portions now  Breakfast: eggs, tortillas sometimes cereal and milk  Lunch: chicken, salad, may be soups  Dinner: fruits, tortillas, beans, eggs Snacks: peanuts (unsalted) Drinks: 3 bottles of water /day; orange or grapefruit juice occasionally, 1  glass with a meal at most   Patient-reported exercise habits: treadmill for 120 minutes Monday-Friday  O:  Lab Results  Component Value Date   HGBA1C 9.4 (A) 09/30/2023   There were no vitals filed for this visit.  Lipid Panel     Component Value Date/Time   CHOL 159 07/22/2023 1227   TRIG 235 (H) 07/22/2023 1227   HDL 41 07/22/2023 1227   CHOLHDL 3.9 07/22/2023 1227   CHOLHDL 6.7 (H) 06/04/2015 0925   VLDL 67 (H) 06/04/2015 0925   LDLCALC 79 07/22/2023 1227    Clinical Atherosclerotic Cardiovascular Disease (ASCVD): No  The 10-year ASCVD risk score (Arnett DK, et al., 2019) is: 9%   Values used to calculate the score:     Age: 73 years     Sex: Female     Is Non-Hispanic African American: No     Diabetic: Yes     Tobacco smoker: No     Systolic Blood Pressure: 125 mmHg     Is BP treated: Yes     HDL Cholesterol: 41 mg/dL     Total Cholesterol: 159 mg/dL   Patient is participating in a Managed Medicaid Plan:  No   A/P: Diabetes longstanding currently uncontrolled, however, her A1c is down to 9.4% (from 11.6 in Jan of this year). Commended her on her improvement but emphasized the need for continued improvement. Medication adherence is okay. She does not wish to restart insulin  at this time. Will discontinue Trulicity  due to her having persistent GI side effects to the medication even after maintaining her on the 1.5 mg weekly dose for the past two months. Transition patient from metformin  1000 mg  BID to metformin  XR 500 mg two tablets BID to manage GI issues as well. Can continue glipizide  for now. Farxiga  was added to her regimen at last visit but she does not recall so we will be starting it today for improved glycemic control. Utilized teach back method to ensure that she knows the changes that have been made to her diabetic regimen including stopping Trulicity , continuing glipizide  10 mg BID, and that she is starting metformin  XR 500 mg two tablets BID and Farxiga  10 mg  daily. She is not symptomatic and she is not currently hypoglycemic but can verbalize appropriate hypoglycemia management plan. -stop Trulicity  1.5 mg weekly  -stop metformin  1000 mg BID  -start metformin  XR 500 mg two tablet BID  -Continued Glipizide  10 mg BID  -Start Farxiga  10 mg daily.  -It is likely that she'll need additional therapy. As she is resistant to adding insulin  today, we will look at trying Ozempic at follow-up to see if she can tolerate this better than the Trulicity . - plan to collect BMP at next visit instead of today d/t patient not starting Farxiga  yet. -Patient educated on purpose, proper use, and potential adverse effects of Farxiga , metformin , and glipizide . -Extensively discussed pathophysiology of diabetes, recommended lifestyle interventions, dietary effects on blood sugar control.  -Counseled on s/sx of and management of hypoglycemia.  -Next A1c anticipated July 2025.   Follow-up: Return to pharmacy clinic in 1 month.  Georges Kings M.S. PharmD Candidate Class of 2026 [employer]   Marene Shape, PharmD, [place], CPP Clinical Pharmacist [facility] [phone]

## 2023-11-05 ENCOUNTER — Ambulatory Visit: Payer: Self-pay | Attending: Family Medicine | Admitting: Pharmacist

## 2023-11-05 ENCOUNTER — Encounter: Payer: Self-pay | Admitting: Pharmacist

## 2023-11-05 DIAGNOSIS — E1165 Type 2 diabetes mellitus with hyperglycemia: Secondary | ICD-10-CM

## 2023-11-05 DIAGNOSIS — Z7984 Long term (current) use of oral hypoglycemic drugs: Secondary | ICD-10-CM

## 2023-11-05 DIAGNOSIS — Z7985 Long-term (current) use of injectable non-insulin antidiabetic drugs: Secondary | ICD-10-CM

## 2023-11-05 MED ORDER — DAPAGLIFLOZIN PROPANEDIOL 10 MG PO TABS: 10.0000 mg | ORAL_TABLET | Freq: Every day | ORAL | 6 refills | Status: DC

## 2023-11-05 MED ORDER — METFORMIN HCL ER 500 MG PO TB24: 1000.0000 mg | ORAL_TABLET | Freq: Two times a day (BID) | ORAL | 1 refills | Status: DC

## 2023-11-17 IMAGING — CT CT ABD-PELV W/ CM
2 of 5 series · 16 of 46 positions shown, 18 images · IV contrast (APPLIED)
Comparison: 09/09/2016

CLINICAL DATA: Follow-up uterine carcinoma and colon carcinoma.
Surveillance.

EXAM:
CT ABDOMEN AND PELVIS WITH CONTRAST
TECHNIQUE: Multidetector CT imaging of the abdomen and pelvis was performed
using the standard protocol following bolus administration of
intravenous contrast.
CONTRAST:  60mL OMNIPAQUE IOHEXOL 350 MG/ML SOLN

[Series 2: abd pel w · axial · 0.73mm/px · z∈[+714,+1129]mm · 13 of 95 slices shown, 15 images]
[im 6/95  soft-tissue]
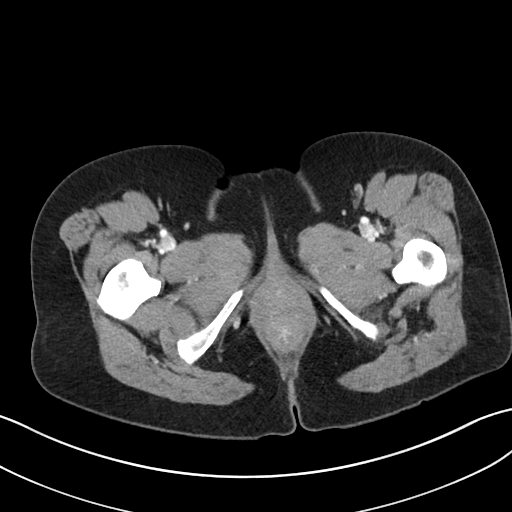
[im 6/95  bone]
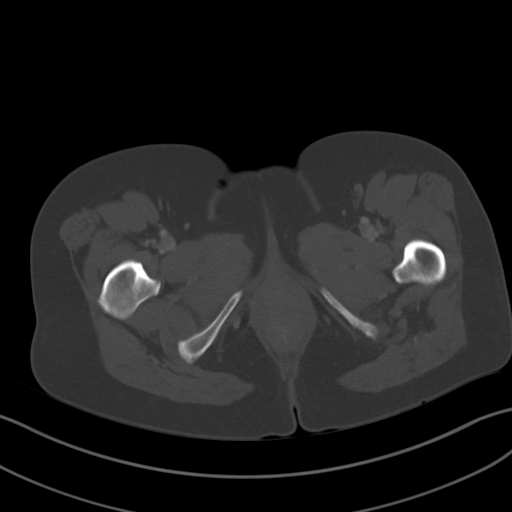
[im 11/95  soft-tissue]
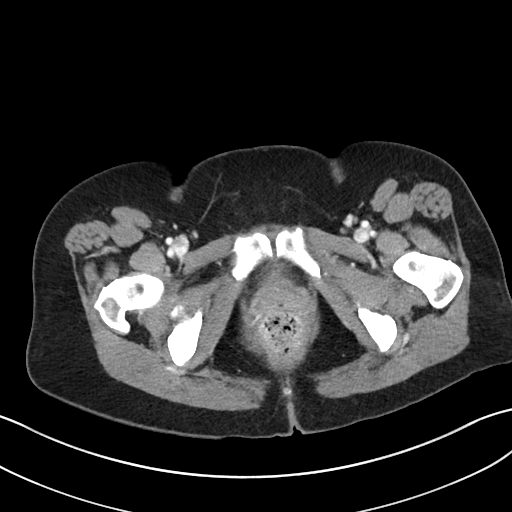
[im 21/95  soft-tissue]
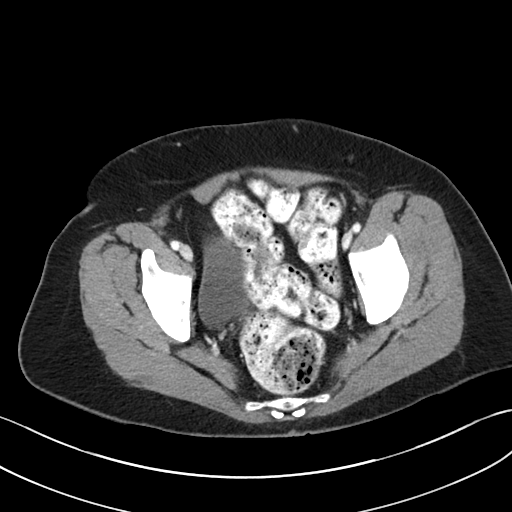
[im 27/95  soft-tissue]
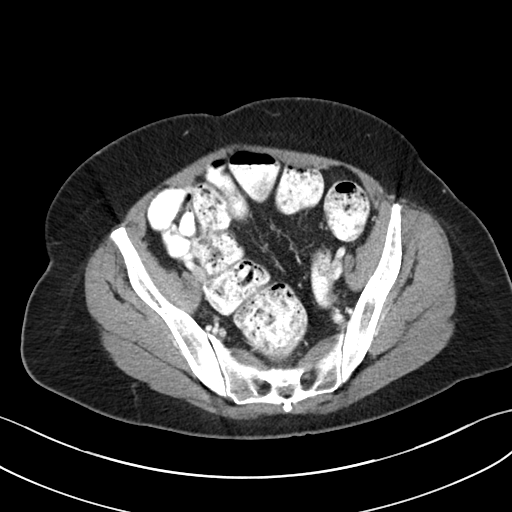
[im 32/95  soft-tissue]
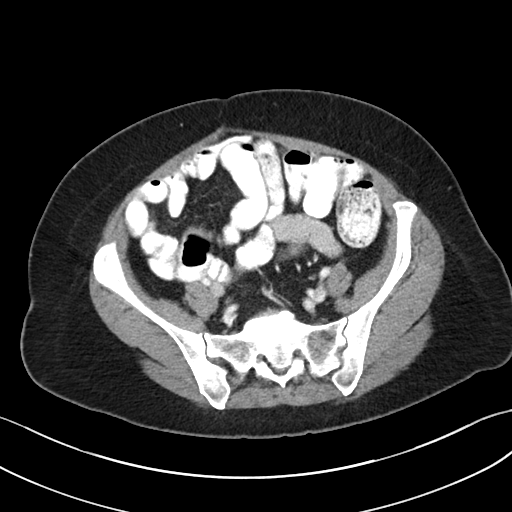
[im 42/95  soft-tissue]
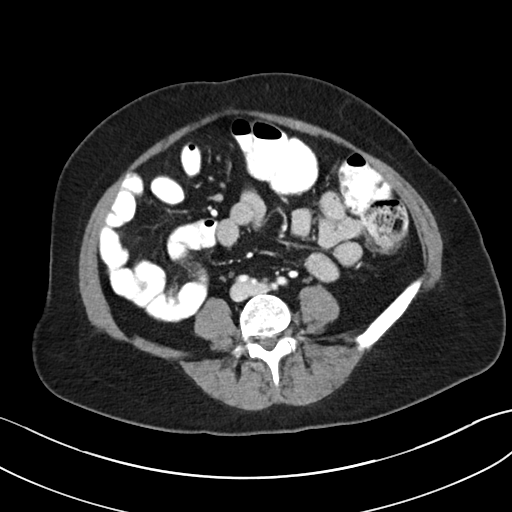
[im 48/95  soft-tissue]
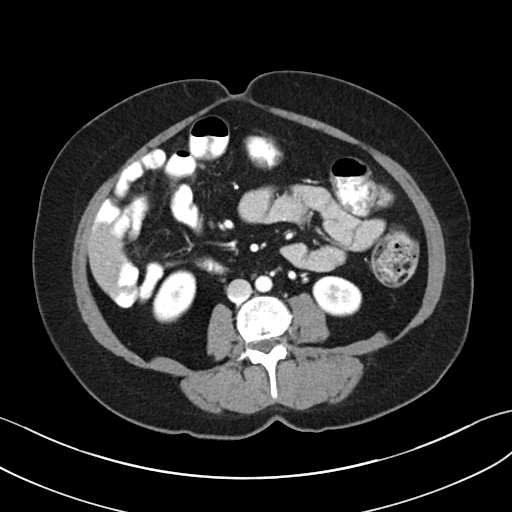
[im 53/95  soft-tissue]
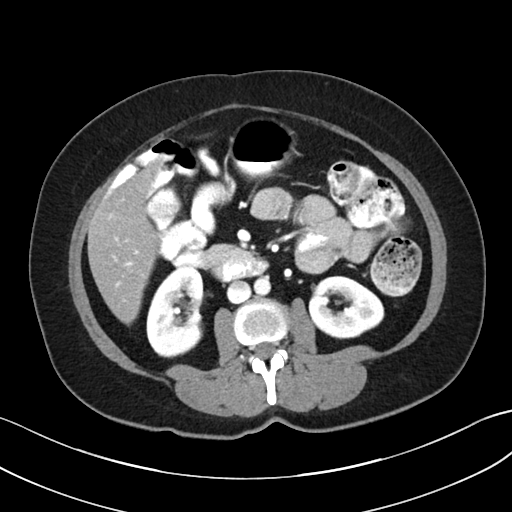
[im 63/95  soft-tissue]
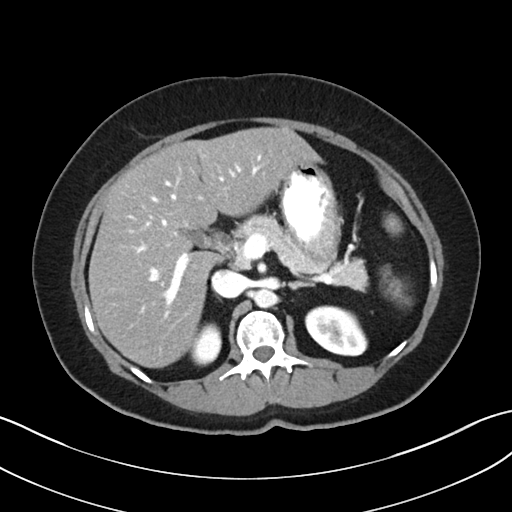
[im 63/95  bone]
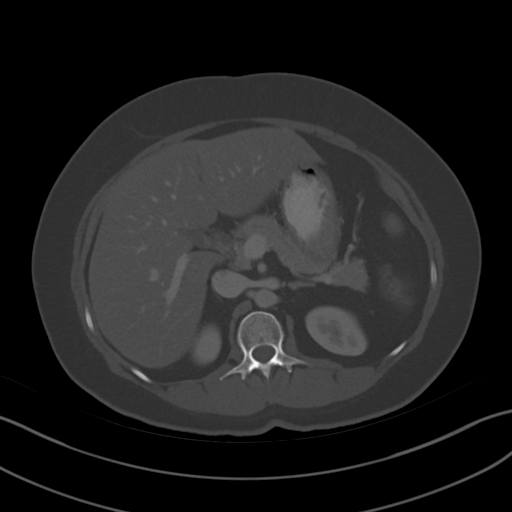
[im 68/95  soft-tissue]
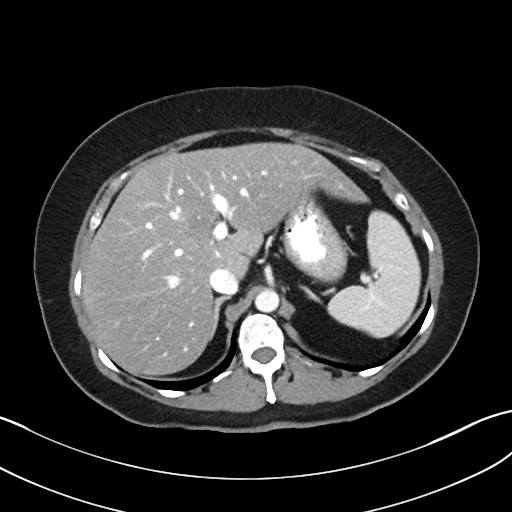
[im 74/95  soft-tissue]
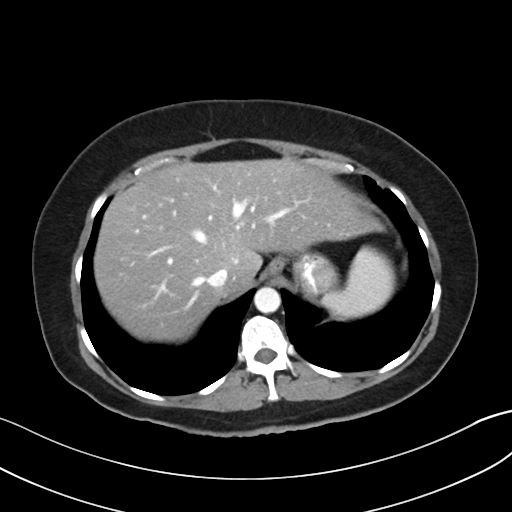
[im 84/95  soft-tissue]
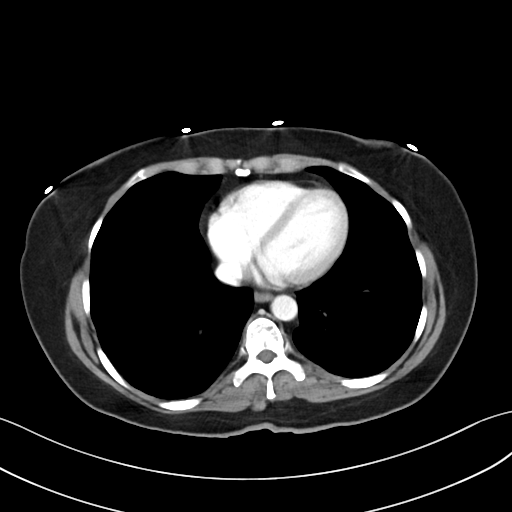
[im 89/95  soft-tissue]
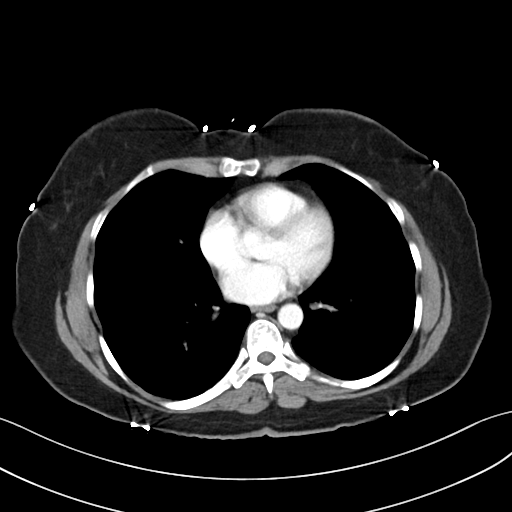

[Series 5: coronal · coronal · 0.74mm/px · 3 of 101 slices shown]
[im 34/101  soft-tissue]
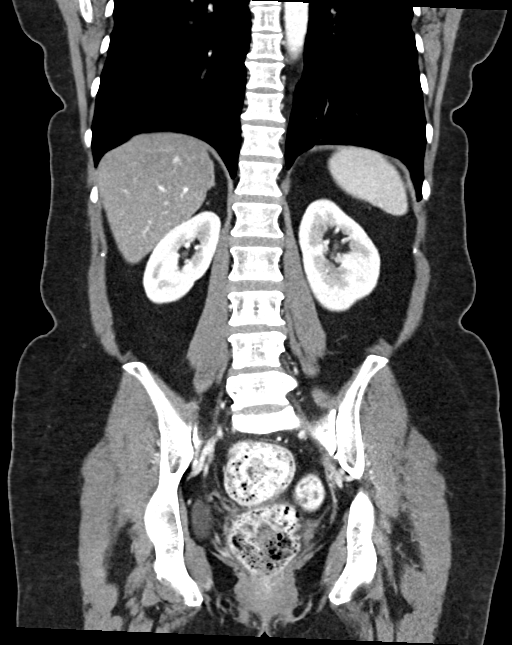
[im 45/101  soft-tissue]
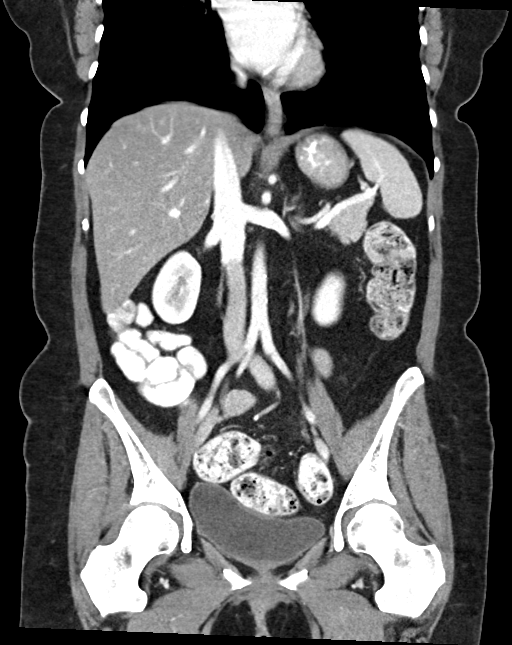
[im 56/101  soft-tissue]
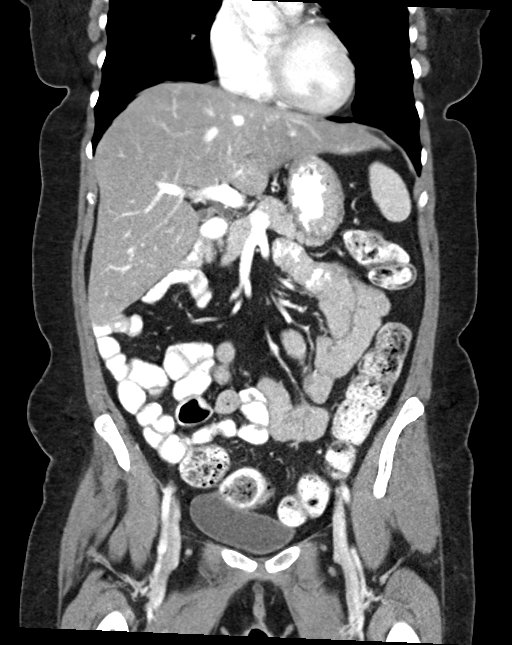

[16 of 46 positions shown; findings below may reference images not displayed]

FINDINGS: Lower Chest: No acute findings.

Hepatobiliary: No hepatic masses identified. Severe diffuse hepatic
steatosis is again seen. Gallbladder is unremarkable. No evidence of
biliary ductal dilatation.

Pancreas:  No mass or inflammatory changes.

Spleen: Within normal limits in size and appearance.

Adrenals/Urinary Tract: No masses identified. No evidence of
ureteral calculi or hydronephrosis.

Stomach/Bowel: Postop changes from right hemicolectomy again seen.
No evidence of obstruction, inflammatory process or abnormal fluid
collections.

Vascular/Lymphatic: No pathologically enlarged lymph nodes. No acute
vascular findings. Mild aortic atherosclerotic calcification again
seen.

Reproductive: Prior hysterectomy noted. Adnexal regions are
unremarkable in appearance.

Other:  None.

Musculoskeletal:  No suspicious bone lesions identified.
IMPRESSION: Stable exam. No evidence of recurrent or metastatic carcinoma within
the abdomen or pelvis.

Severe hepatic steatosis.

Aortic Atherosclerosis (RUG2G-POL.L).

## 2023-12-09 ENCOUNTER — Ambulatory Visit: Payer: Self-pay | Attending: Family Medicine | Admitting: Pharmacist

## 2023-12-09 ENCOUNTER — Telehealth: Payer: Self-pay | Admitting: Pharmacist

## 2023-12-09 ENCOUNTER — Other Ambulatory Visit: Payer: Self-pay

## 2023-12-09 ENCOUNTER — Encounter: Payer: Self-pay | Admitting: Pharmacist

## 2023-12-09 DIAGNOSIS — E1165 Type 2 diabetes mellitus with hyperglycemia: Secondary | ICD-10-CM

## 2023-12-09 DIAGNOSIS — Z7984 Long term (current) use of oral hypoglycemic drugs: Secondary | ICD-10-CM

## 2023-12-09 DIAGNOSIS — Z7985 Long-term (current) use of injectable non-insulin antidiabetic drugs: Secondary | ICD-10-CM

## 2023-12-09 MED ORDER — OZEMPIC (0.25 OR 0.5 MG/DOSE) 2 MG/3ML ~~LOC~~ SOPN
0.2500 mg | PEN_INJECTOR | SUBCUTANEOUS | 1 refills | Status: DC
  Filled 2023-12-09: qty 3, 42d supply, fill #0

## 2023-12-09 NOTE — Progress Notes (Signed)
 AMN interpreter used for this visit: ID #[id number], Marlon   S:     No chief complaint on file.  63 y.o. female with PMHx significant for HTN, GERD, T2DM, HLD, hepatic steatosis, and history of uterine cancer/endometrial/colon cancer who presents for diabetes evaluation, education, and management.    Last seen by pharmacist on 11/05/2023 where patient reported continued gas pains, abdominal pain, bloating and diarrhea associated with Trulicity  use. We had her stop Trulicity  due to this. Additionally, we found that she was not taking Farxiga  as prescribed. We had her start this, changed metformin  to XR form, and continued glipizide .   Today, patient reports that her sugars are elevated. She stopped Trulicity  as instructed, and her GI side effects have resolved. She changed metformin  to XR and diarrhea has resolved. She is taking glipizide  as prescribed. She did not start the Farxiga .    insurance: [facility]   Patient reports monitoring blood sugars at home with most numbers in the 200s.  Denies hypoglycemic events.  Patient denies nocturia (nighttime urination) (once a night)  Patient denies  improvement in neuropathy Patient denies visual changes. Patient denies self foot exams.   Current diabetes medications include:  glipizide  10 mg BID, metformin  1000 mg BID (take 500 mg XR tablets - 2 tablets BID), Farxiga  10 mg daily (not taking).   Family/Social History: No  personal or family history of thyroid cancers/disease   Patient reported dietary habits: eats 3 meals/day but eating smaller portions now  Breakfast: eggs, tortillas sometimes cereal and milk  Lunch: chicken, salad, may be soups  Dinner: fruits, tortillas, beans, eggs Snacks: peanuts (unsalted) Drinks: 3 bottles of water /day; orange or grapefruit juice occasionally, 1 glass with a meal at most   Patient-reported exercise habits: treadmill for 120 minutes Monday-Friday  O:  Lab Results  Component Value Date    HGBA1C 9.4 (A) 09/30/2023   There were no vitals filed for this visit.  Lipid Panel     Component Value Date/Time   CHOL 159 07/22/2023 1227   TRIG 235 (H) 07/22/2023 1227   HDL 41 07/22/2023 1227   CHOLHDL 3.9 07/22/2023 1227   CHOLHDL 6.7 (H) 06/04/2015 0925   VLDL 67 (H) 06/04/2015 0925   LDLCALC 79 07/22/2023 1227    Clinical Atherosclerotic Cardiovascular Disease (ASCVD): No  The 10-year ASCVD risk score (Arnett DK, et al., 2019) is: 9%   Values used to calculate the score:     Age: 23 years     Clincally relevant sex: Female     Is Non-Hispanic African American: No     Diabetic: Yes     Tobacco smoker: No     Systolic Blood Pressure: 125 mmHg     Is BP treated: Yes     HDL Cholesterol: 41 mg/dL     Total Cholesterol: 159 mg/dL   Patient is participating in a Managed Medicaid Plan:  No   A/P: Diabetes longstanding currently uncontrolled. Medication adherence is okay. She continues with hyperglycemia at home. She was having several GI side effects with Trulicity  and IR metformin . We stopped Trulicity  and changed metformin  to the XR form and these resolved. I had a shared decision-making discussion with her concerning trying a low dose Ozempic vs adding basal insulin  given her home sugars. She understands that the GI side effects may recur with Ozempic but wants to try this before insulin . She is not symptomatic and she is not currently hypoglycemic but can verbalize appropriate hypoglycemia management plan. -  Start Ozempic 0.25 mg weekly  -Continue metformin  XR 500 mg as two tablets (1000 mg) BID  -Continued Glipizide  10 mg BID  -Start Farxiga  10 mg daily.  -Patient educated on purpose, proper use, and potential adverse effects of Farxiga , metformin , glipizide , and Ozempic. -Extensively discussed pathophysiology of diabetes, recommended lifestyle interventions, dietary effects on blood sugar control.  -Counseled on s/sx of and management of hypoglycemia.  -Next A1c  anticipated July 2025.   Follow-up: Return to pharmacy clinic in 2 months. PCP: 12/29/2023  Herlene Fleeta Morris, PharmD, BCACP, CPP Clinical Pharmacist [facility] [phone]

## 2023-12-09 NOTE — Telephone Encounter (Signed)
 Hey friend,   This patient had GI side effects with Trulicity . Can we start the process of signing her up for patient assistance? Do we have any 0.25 mg pens to give her as surplus if she's able to be enrolled in PASS?

## 2023-12-16 ENCOUNTER — Other Ambulatory Visit: Payer: Self-pay

## 2023-12-22 ENCOUNTER — Other Ambulatory Visit: Payer: Self-pay

## 2023-12-28 ENCOUNTER — Other Ambulatory Visit: Payer: Self-pay | Admitting: Family Medicine

## 2023-12-28 ENCOUNTER — Telehealth: Payer: Self-pay | Admitting: Family Medicine

## 2023-12-28 DIAGNOSIS — E119 Type 2 diabetes mellitus without complications: Secondary | ICD-10-CM

## 2023-12-28 NOTE — Telephone Encounter (Signed)
 Called pt to confirm appt. Pt will be present.

## 2023-12-29 ENCOUNTER — Encounter: Payer: Self-pay | Admitting: Family Medicine

## 2023-12-29 ENCOUNTER — Ambulatory Visit: Payer: Self-pay | Attending: Family Medicine | Admitting: Family Medicine

## 2023-12-29 VITALS — BP 128/83 | HR 92 | Wt 138.6 lb

## 2023-12-29 DIAGNOSIS — E1165 Type 2 diabetes mellitus with hyperglycemia: Secondary | ICD-10-CM

## 2023-12-29 DIAGNOSIS — E1169 Type 2 diabetes mellitus with other specified complication: Secondary | ICD-10-CM

## 2023-12-29 DIAGNOSIS — E785 Hyperlipidemia, unspecified: Secondary | ICD-10-CM

## 2023-12-29 DIAGNOSIS — I10 Essential (primary) hypertension: Secondary | ICD-10-CM

## 2023-12-29 DIAGNOSIS — Z7984 Long term (current) use of oral hypoglycemic drugs: Secondary | ICD-10-CM

## 2023-12-29 LAB — GLUCOSE, POCT (MANUAL RESULT ENTRY): POC Glucose: 138 mg/dL — AB (ref 70–99)

## 2023-12-29 LAB — POCT GLYCOSYLATED HEMOGLOBIN (HGB A1C): HbA1c, POC (controlled diabetic range): 10.1 % — AB (ref 0.0–7.0)

## 2023-12-29 MED ORDER — PIOGLITAZONE HCL 15 MG PO TABS: 15.0000 mg | ORAL_TABLET | Freq: Every day | ORAL | 1 refills | Status: DC

## 2023-12-29 NOTE — Progress Notes (Signed)
 Subjective:  Patient ID: Laura Douglas, female    DOB: 12-08-1960  Age: 63 y.o. MRN: [id number]  CC: Medical Management of Chronic Issues and Dizziness     Discussed the use of AI scribe software for clinical note transcription with the patient, who gave verbal consent to proceed.  History of Present Illness Laura Douglas is a 63 year old female with a history of Lynch syndrome, colon cancer in 2009 (status post resection), endometrial cancer (status post TAHBSO LND in 08/2014 with pathology revealing stage Ia grade 1 endometrioid endometrial cancer with no lymphovascular space invasion and negative lymph nodes) type 2 diabetes mellitus who presents with dizziness related to medication use.  She experiences dizziness specifically when taking Farxiga , which was started two months ago. No dizziness occurs outside of taking Farxiga . She was initially prescribed Ozempic  but did not start it due to insurance coverage issues. Her current diabetes medications include Farxiga , glipizide , and metformin . She takes metformin  1000 mg twice daily, as the lower dose of 500 mg was not effective. Her previous A1c was 9.4% and is now 10.1%. Endorses adherence to her antihypertensive and statin.    Past Medical History:  Diagnosis Date   Blood transfusion without reported diagnosis    Colon cancer [facility])    possible? Patient had surgery in [place]. Denies needing adjuvant therapy. The surgery removed a tumor from the colon.   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    Past Surgical History:  Procedure Laterality Date   ABDOMINAL HYSTERECTOMY     abortions     twice    arm surgery     left arm   COLON SURGERY     COLON SURGERY  2010   tumor removal   COLONOSCOPY  02/17/2018    Mansouraty ta x 2, hems   FRACTURE SURGERY  2006   Lt lower arm   MULTIPLE TOOTH EXTRACTIONS     ROBOTIC ASSISTED TOTAL HYSTERECTOMY WITH BILATERAL SALPINGO OOPHERECTOMY Bilateral 09/11/2014   Procedure: ROBOTIC ASSISTED TOTAL LAPAROSCOPIC HYSTERECTOMY WITH BILATERAL SALPINGO OOPHORECTOMY LYMPH NODE DISSECTION; LYSIS OF ADHESIONS;  Surgeon: Sari Bachelor, MD;  Location: [facility];  Service: Gynecology;  Laterality: Bilateral;   TUBAL LIGATION  1993   UPPER GASTROINTESTINAL ENDOSCOPY      Family History  Problem Relation Age of Onset   Diabetes Mother    Prostate cancer Father    Uterine cancer Sister 31   Cancer Sister        colon cancer as well age 67?   Colon cancer Sister    Alcohol abuse Brother    Cirrhosis Brother        cause of death   Uterine cancer Maternal Aunt    Breast cancer Neg Hx    Rectal cancer Neg Hx    Stomach cancer Neg Hx    Esophageal cancer Neg Hx     Social History   Socioeconomic History   Marital status: Married    Spouse name: Not on file   Number of children: 4   Years of education: Not on file   Highest education level: Not on file  Occupational History   Occupation: Factory work.  Tobacco Use   Smoking  status: Never   Smokeless tobacco: Never  Vaping Use   Vaping status: Never Used  Substance and Sexual Activity   Alcohol use: No   Drug use: No   Sexual activity: Yes    Birth control/protection: Surgical    Comment: Hysterectomy  Other Topics Concern   Not on file  Social History Narrative   Lives at home with her female partner   Only son killed by gang violence in [place].   Social Drivers of Health   Financial Resource Strain: Patient Declined (07/01/2023)   Overall Financial Resource Strain (CARDIA)    Difficulty of Paying Living Expenses: Patient declined  Food Insecurity: No Food Insecurity (07/01/2023)   Hunger Vital Sign    Worried About Running Out of Food in the Last Year: Never true    Ran Out of Food in the Last  Year: Never true  Transportation Needs: No Transportation Needs (07/01/2023)   PRAPARE - Administrator, Civil Service (Medical): No    Lack of Transportation (Non-Medical): No  Physical Activity: Unknown (07/01/2023)   Exercise Vital Sign    Days of Exercise per Week: Not on file    Minutes of Exercise per Session: 60 min  Stress: Patient Declined (07/01/2023)   [employer] of Occupational Health - Occupational Stress Questionnaire    Feeling of Stress : Patient declined  Social Connections: Socially Integrated (07/01/2023)   Social Connection and Isolation Panel    Frequency of Communication with Friends and Family: Twice a week    Frequency of Social Gatherings with Friends and Family: Once a week    Attends Religious Services: 1 to 4 times per year    Active Member of [employer] or Organizations: Yes    Attends Banker Meetings: 1 to 4 times per year    Marital Status: Living with partner    No Known Allergies  Outpatient Medications Prior to Visit  Medication Sig Dispense Refill   atorvastatin  (LIPITOR) 40 MG tablet Take 1 tablet (40 mg total) by mouth daily. 90 tablet 0   Blood Glucose Monitoring Suppl (AGAMATRIX PRESTO) w/Device KIT Check sugars before meals twice sugars 1 kit 0   glipiZIDE  (GLUCOTROL ) 10 MG tablet Take 1 tablet (10 mg total) by mouth 2 (two) times daily before a meal. 180 tablet 1   hydrochlorothiazide  (HYDRODIURIL ) 25 MG tablet Take 1 tablet by mouth once daily 90 tablet 1   lisinopril  (ZESTRIL ) 5 MG tablet Take 1 tablet (5 mg total) by mouth daily with HCTZ. 90 tablet 0   metFORMIN  (GLUCOPHAGE -XR) 500 MG 24 hr tablet Take 2 tablets (1,000 mg total) by mouth 2 (two) times daily with a meal. 360 tablet 1   pantoprazole  (PROTONIX ) 40 MG tablet Take 1 tablet (40 mg total) by mouth 2 (two) times daily before a meal. 60 tablet 3   dapagliflozin  propanediol (FARXIGA ) 10 MG TABS tablet Take 1 tablet (10 mg total) by mouth daily before  breakfast. 30 tablet 6   Semaglutide ,0.25 or 0.5MG /DOS, (OZEMPIC , 0.25 OR 0.5 MG/DOSE,) 2 MG/3ML SOPN Inject 0.25 mg into the skin once a week. 3 mL 1   No facility-administered medications prior to visit.     ROS Review of Systems  Constitutional:  Negative for activity change and appetite change.  HENT:  Negative for sinus pressure and sore throat.   Respiratory:  Negative for chest tightness, shortness of breath and wheezing.   Cardiovascular:  Negative for chest pain and palpitations.  Gastrointestinal:  Negative for abdominal distention, abdominal pain and constipation.  Genitourinary: Negative.   Musculoskeletal: Negative.   Psychiatric/Behavioral:  Negative for behavioral problems and dysphoric mood.     Objective:  BP 128/83 (BP Location: Right Arm, Patient Position: Sitting, Cuff Size: Normal)   Pulse 92   Wt 138 lb 9.6 oz (62.9 kg)   LMP  (LMP Unknown)   SpO2 97%   BMI 23.79 kg/m      12/29/2023   11:49 AM 07/01/2023   10:53 AM 06/14/2023    3:16 PM  BP/Weight  Systolic BP 128 125 124  Diastolic BP 83 81 81  Wt. (Lbs) 138.6 137.6   BMI 23.79 kg/m2 23.62 kg/m2       Physical Exam Constitutional:      Appearance: She is well-developed.  Cardiovascular:     Rate and Rhythm: Normal rate.     Heart sounds: Normal heart sounds. No murmur heard. Pulmonary:     Effort: Pulmonary effort is normal.     Breath sounds: Normal breath sounds. No wheezing or rales.  Chest:     Chest wall: No tenderness.  Abdominal:     General: Bowel sounds are normal. There is no distension.     Palpations: Abdomen is soft. There is no mass.     Tenderness: There is no abdominal tenderness.  Musculoskeletal:        General: Normal range of motion.     Right lower leg: No edema.     Left lower leg: No edema.  Neurological:     Mental Status: She is alert and oriented to person, place, and time.  Psychiatric:        Mood and Affect: Mood normal.    Diabetic Foot Exam -  Simple   Simple Foot Form Diabetic Foot exam was performed with the following findings: Yes 12/29/2023 12:04 PM  Visual Inspection No deformities, no ulcerations, no other skin breakdown bilaterally: Yes Sensation Testing Intact to touch and monofilament testing bilaterally: Yes Pulse Check Posterior Tibialis and Dorsalis pulse intact bilaterally: Yes Comments         Latest Ref Rng & Units 06/14/2023   10:06 AM 11/25/2022    3:11 PM 07/06/2022    3:40 PM  CMP  Glucose 70 - 99 mg/dL 795  807  776   BUN 8 - 23 mg/dL 11  15  12    Creatinine 0.44 - 1.00 mg/dL 9.19  9.35  9.30   Sodium 135 - 145 mmol/L 132  140  138   Potassium 3.5 - 5.1 mmol/L 3.2  4.1  3.4   Chloride 98 - 111 mmol/L 100  99  95   CO2 22 - 32 mmol/L 21  26  24    Calcium  8.9 - 10.3 mg/dL 8.6  89.7  89.5   Total Protein 6.5 - 8.1 g/dL 6.8  6.9  7.6   Total Bilirubin 0.0 - 1.2 mg/dL 0.7  0.5  0.5   Alkaline Phos 38 - 126 U/L 54  65  65   AST 15 - 41 U/L 30  25  23    ALT 0 - 44 U/L 30  33  31     Lipid Panel     Component Value Date/Time   CHOL 159 07/22/2023 1227   TRIG 235 (H) 07/22/2023 1227   HDL 41 07/22/2023 1227   CHOLHDL 3.9 07/22/2023 1227   CHOLHDL 6.7 (H) 06/04/2015 0925   VLDL 67 (H) 06/04/2015 0925   LDLCALC  79 07/22/2023 1227    CBC    Component Value Date/Time   WBC 5.8 06/14/2023 1006   RBC 4.65 06/14/2023 1006   HGB 13.5 06/14/2023 1006   HGB 13.7 11/17/2016 0852   HCT 42.0 06/14/2023 1006   HCT 42.8 11/17/2016 0852   PLT 177 06/14/2023 1006   PLT 237 11/17/2016 0852   MCV 90.3 06/14/2023 1006   MCV 86 11/17/2016 0852   MCH 29.0 06/14/2023 1006   MCHC 32.1 06/14/2023 1006   RDW 12.7 06/14/2023 1006   RDW 13.1 11/17/2016 0852   LYMPHSABS 1.4 06/14/2023 1006   LYMPHSABS 2.7 11/17/2016 0852   MONOABS 0.4 06/14/2023 1006   EOSABS 0.0 06/14/2023 1006   EOSABS 0.2 11/17/2016 0852   BASOSABS 0.0 06/14/2023 1006   BASOSABS 0.0 11/17/2016 0852    Lab Results  Component Value  Date   HGBA1C 10.1 (A) 12/29/2023   Lab Results  Component Value Date   HGBA1C 10.1 (A) 12/29/2023   HGBA1C 9.4 (A) 09/30/2023   HGBA1C 11.6 (A) 07/01/2023        Assessment & Plan Type 2 diabetes mellitus with hyperglycemia A1c increased to 10.1%, indicating poor glycemic control. Dizziness reported with Farxiga . Insurance does not cover Ozempic . - Discontinue Farxiga  due to dizziness. - Prescribe Actos  for diabetes management. - Continue Glipizide  and Metformin , Metformin  at 1000 mg twice daily. - Order blood tests for kidney and liver function. - Schedule follow-up in 3 months to re-evaluate A1c levels.  Hypertension associated with Type 2 Diabetes Mellitus Controlled Continue current antihypertensive -Counseled on blood pressure goal of less than 130/80, low-sodium, DASH diet, medication compliance, 150 minutes of moderate intensity exercise per week. Discussed medication compliance, adverse effects.   Hyperlipidemia associated with Type 2 Diabetes Controlled with LDL at goal of <100, slightly elevated trigycerides Continue statin  Meds ordered this encounter  Medications   pioglitazone  (ACTOS ) 15 MG tablet    Sig: Take 1 tablet (15 mg total) by mouth daily.    Dispense:  90 tablet    Refill:  1    Discontinue Farxiga     Follow-up: Return in about 3 months (around 03/30/2024) for Chronic medical conditions.       Corrina Sabin, MD, FAAFP. [facility] [place], [place] [phone]   12/29/2023, 12:10 PM

## 2023-12-29 NOTE — Patient Instructions (Signed)
 Pioglitazone  Tablets Qu es este medicamento? La PIOGLITAZONA trata la diabetes tipo 2. Ayuda al cuerpo a usar la Comptroller eficiente y esto disminuye el nivel de azcar en la sangre (glucosa). Con frecuencia, este medicamento se combina con cambios en la dieta y el ejercicio. Este medicamento puede ser utilizado para otros usos; si tiene alguna pregunta consulte con su proveedor de atencin mdica o con su farmacutico. MARCAS COMUNES: Actos  Qu le debo informar a mi profesional de la salud antes de tomar este medicamento? Necesitan saber si usted presenta alguno de los siguientes problemas o situaciones: Database administrator de vejiga Cetoacidosis diabtica Una enfermedad ocular llamada edema macular Enfermedad cardiaca Insuficiencia cardiaca Enfermedad renal Enfermedad heptica Sndrome de ovario poliqustico Pemenopusica Inflamacin de los brazos, las piernas o los pies Diabetes tipo 1 Una reaccin alrgica o inusual a la pioglitazona, a otros medicamentos, alimentos, colorantes o conservantes Si est embarazada o buscando quedar embarazada Si est amamantando a un beb Cmo debo utilizar este medicamento? Tome este medicamento por va oral con un vaso de agua. Siga las instrucciones de la etiqueta del [place]. Administre el medicamento aproximadamente a la Manufacturing systems engineer. No lo use con una frecuencia mayor a la indicada. Su farmacutico le dar una Gua del medicamento especial (MedGuide, nombre en ingls) con cada receta y en cada ocasin que la vuelva a surtir. Asegrese de leer esta informacin cada vez cuidadosamente. Hable con su equipo de atencin sobre el uso de este medicamento en nios. Puede requerir atencin especial. Sobredosis: Pngase en contacto inmediatamente con un centro toxicolgico o una sala de urgencia si usted cree que haya tomado demasiado medicamento.<br>ATENCIN: [employer] es solo para usted. No comparta este medicamento con nadie. Qu sucede si  me olvido de una dosis? Si olvida una dosis, tmela lo antes posible. Si es casi la hora de la prxima dosis, tome slo esa dosis. No tome dosis adicionales o dobles. Qu puede interactuar con este medicamento? Gemfibrozil Rifampicina Topiramato Otros medicamentos podran afectar la forma en que funciona este medicamento. Hable con su equipo de atencin [employer] medicamentos que usa . Es posible que le sugieran cambios en su plan de tratamiento para reducir el riesgo de efectos secundarios y asegurarse de que los medicamentos actan del modo previsto. Algunos medicamentos podran afectar los niveles de azcar en la sangre u ocultar los sntomas de niveles bajos de azcar en la sangre (hipoglucemia). Hable con su equipo de atencin [employer] que usa . Es posible que le sugieran cambiar su dosis o revisar sus niveles de azcar en la sangre con ms frecuencia. Los medicamentos que podran [employer] niveles de azcar en la sangre incluyen: Alcohol Ciertos antibiticos, tales como ciprofloxacino, levofloxacino, sulfametoxasol; trimetoprima Ciertos medicamentos para la presin sangunea o enfermedad cardiaca, tales como benazepril, enalapril, lisinopril , losartn, valsartn Ciertos medicamentos para afecciones de salud mental, tales como fluoxetina u olanzapina Diurticos, como hidroclorotiazida (HCTZ) Hormonas estrgenos y Naval architect medicamentos para la diabetes Medicamentos esteroideos, tales como la prednisona o la cortisona Testosterona Hormonas tiroideas Los medicamentos que pueden Investment banker, operational los sntomas de un nivel bajo de azcar en la sangre incluyen: Betabloqueadores, tales como atenolol, metoprolol, propranolol Clonidina Guanetidina Reserpina Puede ser que esta lista no menciona todas las posibles interacciones. Informe a su profesional de [employer] de [employer] productos a base de hierbas, medicamentos de [place] o suplementos nutritivos que est  tomando. Si usted fuma, consume bebidas alcohlicas o si utiliza drogas ilegales, indqueselo tambin a su profesional  de [employer]. Algunas sustancias pueden interactuar con su medicamento. A qu debo estar atento al usar [employer]? Visite a su equipo de atencin para que revise su evolucin peridicamente. Usted podra necesitar realizarse anlisis de sangre mientras est usando este medicamento. Su equipo de Careers adviser su Hemoglobina A1C (A1C). Esta prueba muestra cul fue su nivel promedio de azcar (glucosa) en la sangre durante los ltimos 2 a 3 meses. Conozca los sntomas del nivel bajo de azcar en sangre y sepa cmo tratarlo. Lleve siempre con usted una fuente de azcar rpida. Algunos ejemplos incluyen caramelos duros de azcar o tabletas de glucosa. Asegrese de [employer] dems sepan que puede ahogarse si come o bebe si su nivel de azcar en la sangre es demasiado bajo y no puede cuidar de s mismo. Debe obtener ayuda mdica de inmediato. Informe a su equipo de atencin si tiene niveles altos de azcar en la sangre. La dosis de su medicamento podra cambiar si su cuerpo est bajo estrs. Algunos tipos de estrs que podran afectar el nivel de azcar en sangre incluyen fiebre, infeccin y [place]. Use un brazalete o una cadena de identificacin mdica. Lleve consigo una tarjeta que describa su afeccin. Incluya en la tarjeta una lista de los medicamentos y las dosis que usa . Este medicamento podra hacer que usted ovule, lo que podra aumentar sus posibilidades de quedar en embarazo. Hable con su equipo de atencin sobre sus opciones de mtodos anticonceptivos mientras usa  este medicamento. Contacte a su equipo de atencin si cree que podra estar en embarazo. Qu efectos secundarios puedo tener al [employer] este medicamento? Efectos secundarios que debe informar a su equipo de atencin tan pronto como sea posible: Reacciones alrgicas: erupcin cutnea, comezn/picazn, urticaria,  hinchazn de la cara, los labios, la lengua o la garganta Cambios en la visin tales como visin borrosa, ver halos alrededor de las luces, prdida de visin Insuficiencia cardiaca: falta de aire, hinchazn de los tobillos, los pies o las manos, aumento de peso repentino, debilidad o fatiga inusuales Lesin en el hgado: dolor en la regin abdominal superior derecha, prdida de apetito, nuseas, heces de color claro, orina amarilla oscura o marrn, color amarillento de los ojos o la piel, debilidad o fatiga inusuales Orina color rojo o marrn oscuro, dolor o dificultad para Geographical information systems officer, Geographical information systems officer con frecuencia Efectos secundarios que generalmente no requieren Psychologist, prison and probation services (debe informarlos a su equipo de atencin si persisten o si son molestos): Dolor de cabeza Dolor muscular Goteo o congestin nasal Puede ser que esta lista no menciona todos los posibles efectos secundarios. Comunquese a su mdico por asesoramiento mdico [employer]. Usted puede informar los efectos secundarios a la FDA por telfono al 1-800-FDA-1088. Dnde debo guardar mi medicina? Mantenga fuera del alcance de nios y Neurosurgeon. Guarde a [employer], entre 15 y 30 grados Celsius (59 y 47 grados Fahrenheit). Mantenga el recipiente bien cerrado y protegido de la humedad. Deseche todo el medicamento que no haya utilizado despus de la fecha de vencimiento. ATENCIN: Este folleto es un resumen. Puede ser que no cubra toda la posible informacin. Si usted tiene preguntas acerca de esta medicina, consulte con su mdico, su farmacutico o su profesional de Radiographer, therapeutic.  2024 Elsevier/Gold Standard (2023-01-14 00:00:00)

## 2023-12-30 LAB — CMP14+EGFR
ALT: 27 IU/L (ref 0–32)
AST: 22 IU/L (ref 0–40)
Albumin: 4.8 g/dL (ref 3.9–4.9)
Alkaline Phosphatase: 64 IU/L (ref 44–121)
BUN/Creatinine Ratio: 18 (ref 12–28)
BUN: 11 mg/dL (ref 8–27)
Bilirubin Total: 0.3 mg/dL (ref 0.0–1.2)
CO2: 16 mmol/L — ABNORMAL LOW (ref 20–29)
Calcium: 9.9 mg/dL (ref 8.7–10.3)
Chloride: 104 mmol/L (ref 96–106)
Creatinine, Ser: 0.62 mg/dL (ref 0.57–1.00)
Globulin, Total: 2.3 g/dL (ref 1.5–4.5)
Glucose: 119 mg/dL — ABNORMAL HIGH (ref 70–99)
Potassium: 4 mmol/L (ref 3.5–5.2)
Sodium: 143 mmol/L (ref 134–144)
Total Protein: 7.1 g/dL (ref 6.0–8.5)
eGFR: 101 mL/min/1.73 (ref >59)

## 2023-12-31 ENCOUNTER — Ambulatory Visit: Payer: Self-pay | Admitting: Family Medicine

## 2024-01-10 ENCOUNTER — Other Ambulatory Visit: Payer: Self-pay | Admitting: Family Medicine

## 2024-01-10 DIAGNOSIS — E119 Type 2 diabetes mellitus without complications: Secondary | ICD-10-CM

## 2024-01-11 ENCOUNTER — Other Ambulatory Visit: Payer: Self-pay | Admitting: Family Medicine

## 2024-01-11 DIAGNOSIS — E119 Type 2 diabetes mellitus without complications: Secondary | ICD-10-CM

## 2024-01-11 NOTE — Telephone Encounter (Unsigned)
 Copied from CRM [id number]. Topic: Clinical - Medication Refill >> Jan 11, 2024 11:20 AM [name] wrote: Medication: lisinopril  (ZESTRIL ) 5 MG tablet  Has the patient contacted their pharmacy? Yes (Agent: If no, request that the patient contact the pharmacy for the refill. If patient does not wish to contact the pharmacy document the reason why and proceed with request.) (Agent: If yes, when and what did the pharmacy advise?) Pharmacy needs order to refill  This is the patient's preferred pharmacy:  [facility] [address] [place] - [address] [place] [address] Phone: [phone] Fax: [phone]  Is this the correct pharmacy for this prescription? Yes If no, delete pharmacy and type the correct one.   Has the prescription been filled recently? No  Is the patient out of the medication? Yes  Has the patient been seen for an appointment in the last year OR does the patient have an upcoming appointment? Yes  Can we respond through MyChart? No  Agent: Please be advised that Rx refills may take up to 3 business days. We ask that you follow-up with your pharmacy.

## 2024-01-11 NOTE — Telephone Encounter (Signed)
 Copied from CRM [id number]. Topic: Clinical - Lab/Test Results >> Jan 11, 2024 11:19 AM [name] wrote:  Reason for CRM:  I gave Laura Douglas the lab results from Dr. Delbert:  Please inform her that her lab result reveals normal kidney and liver function.  Thank you She had no questions.

## 2024-01-12 NOTE — Telephone Encounter (Signed)
 Refused Lisinopril  5 mg because it was sent 01/10/2024 #90, 0 refills.

## 2024-01-19 ENCOUNTER — Other Ambulatory Visit: Payer: Self-pay | Admitting: Physician Assistant

## 2024-01-19 ENCOUNTER — Other Ambulatory Visit: Payer: Self-pay | Admitting: Family Medicine

## 2024-01-19 DIAGNOSIS — E1149 Type 2 diabetes mellitus with other diabetic neurological complication: Secondary | ICD-10-CM

## 2024-01-19 MED ORDER — METFORMIN HCL ER 500 MG PO TB24: 1000.0000 mg | ORAL_TABLET | Freq: Two times a day (BID) | ORAL | 1 refills | Status: DC

## 2024-02-07 ENCOUNTER — Other Ambulatory Visit: Payer: Self-pay | Admitting: Family Medicine

## 2024-02-07 ENCOUNTER — Telehealth: Payer: Self-pay | Admitting: Family Medicine

## 2024-02-07 DIAGNOSIS — E1169 Type 2 diabetes mellitus with other specified complication: Secondary | ICD-10-CM

## 2024-02-07 NOTE — Telephone Encounter (Signed)
Confirmed appt for 8/26

## 2024-02-07 NOTE — Progress Notes (Unsigned)
 S:     No chief complaint on file.  63 y.o. female with PMHx significant for HTN, GERD, T2DM, HLD, hepatic steatosis, and history of uterine cancer/endometrial/colon cancer who presents for diabetes evaluation, education, and management.    Last seen by pharmacist on 12/09/23. At that visit, pt admitted to stopping Farxiga . We restarted this, added Ozempic  0.25 mg weekly, continued on metformin  XR 500 mg BID, continued on glipizide  10 mg BID. Of note, patient previously had GI upset (gas pain, abdominal pain, bloating, diarrhea) associated with Trulicity  and this was stopped for that reason. PA was submitted for the Ozempic  on 12/09/23.   Since that visit with us , pt last saw their PCP, Dr. Delbert, on 12/29/23. At this visit, pt reported feeling dizzy with the Farxiga  and was not able to start the Ozempic  due to insurance reasons (copay is $900/month due to deductible). Of note, A1c at that visit was 10.1% (up from 9.4% on 09/30/23). Dr. Newlin discontinued Farxiga , continued glipizide  and metformin , and initiated pioglitazone  15 mg daily.   Insurance: [facility]   Patient reports monitoring blood sugars at home, recalled 118 fasting value. Admits that post-prandial values have been in the 200-300s.   Denies hypoglycemic events.  Patient denies nocturia (nighttime urination) (once a night).  Patient denies neuropathy. Has improved from baseline.  Patient denies visual changes. Patient denies self foot exams.   Current diabetes medications include:  glipizide  10 mg BID, metformin  XR 500 BID (patient states they are taking IR 1000 mg BID), pioglitazone  15 mg daily. Pt has been experiencing burning sensation in the chest, likely due to acid reflux, that has been occurring for a while.   Family/Social History: No  personal or family history of thyroid cancers/disease   Patient reported dietary habits: eats 3 meals/day but eating smaller portions now  Breakfast: eggs, tortillas  sometimes cereal and milk  Lunch: chicken, salad, may be soups  Dinner: fruits, tortillas, beans, eggs Snacks: peanuts (unsalted) Drinks: 3 bottles of water /day; orange or grapefruit juice occasionally, 1 glass with a meal at most   Patient-reported exercise habits: treadmill for 120 minutes Monday-Friday.   O:  Lab Results  Component Value Date   HGBA1C 10.1 (A) 12/29/2023   There were no vitals filed for this visit.  Lipid Panel     Component Value Date/Time   CHOL 159 07/22/2023 1227   TRIG 235 (H) 07/22/2023 1227   HDL 41 07/22/2023 1227   CHOLHDL 3.9 07/22/2023 1227   CHOLHDL 6.7 (H) 06/04/2015 0925   VLDL 67 (H) 06/04/2015 0925   LDLCALC 79 07/22/2023 1227    Clinical Atherosclerotic Cardiovascular Disease (ASCVD): No  The 10-year ASCVD risk score (Arnett DK, et al., 2019) is: 10.7%   Values used to calculate the score:     Age: 67 years     Clincally relevant sex: Female     Is Non-Hispanic African American: No     Diabetic: Yes     Tobacco smoker: No     Systolic Blood Pressure: 137 mmHg     Is BP treated: Yes     HDL Cholesterol: 41 mg/dL     Total Cholesterol: 159 mg/dL   Patient is participating in a Managed Medicaid Plan:  No   A/P: Diabetes longstanding currently uncontrolled with an A1c of 10.1% (above a goal of <7%). Medication adherence is optimal. She is not symptomatic and she is not currently hypoglycemic but can verbalize appropriate hypoglycemia management plan. Pt is  having post-prandial spikes in the 200s-300s and would likely benefit from a basal insulin  for better glycemic control throughout the day. A GLP-1 agonist would be preferred but the patient cannot tolerate Trulicity  and Ozempic  is unaffordable at this time ($900/month). Pt was willing to try Basaglar .   -Initiated insulin  glargine (Basaglar ) 10 units daily in the morning  -Switched to Glipizide  XR 10 mg once in the morning  -Continued metformin  IR 1000 mg BID  -Continued pioglitazone   15 mg daily  -Patient educated on purpose, proper use, and potential adverse effects of Basaglar , metformin , glipizide , and pioglitazone . -Extensively discussed pathophysiology of diabetes, recommended lifestyle interventions, dietary effects on blood sugar control.  -Counseled on s/sx of and management of hypoglycemia.  -Next A1c anticipated 03/2024.  Follow-up: Return to pharmacy clinic on 03/21/24   Patient seen with: Tinnie Norrie, PharmD Candidate   Herlene Fleeta Morris, PharmD, BCACP, CPP Clinical Pharmacist [facility] [phone]

## 2024-02-08 ENCOUNTER — Encounter: Payer: Self-pay | Admitting: Pharmacist

## 2024-02-08 ENCOUNTER — Ambulatory Visit: Payer: Self-pay | Attending: Family Medicine | Admitting: Pharmacist

## 2024-02-08 ENCOUNTER — Other Ambulatory Visit: Payer: Self-pay

## 2024-02-08 DIAGNOSIS — Z7984 Long term (current) use of oral hypoglycemic drugs: Secondary | ICD-10-CM

## 2024-02-08 DIAGNOSIS — E1149 Type 2 diabetes mellitus with other diabetic neurological complication: Secondary | ICD-10-CM

## 2024-02-08 DIAGNOSIS — E1165 Type 2 diabetes mellitus with hyperglycemia: Secondary | ICD-10-CM

## 2024-02-08 MED ORDER — GLIPIZIDE ER 10 MG PO TB24: 10.0000 mg | ORAL_TABLET | Freq: Every day | ORAL | 1 refills | Status: DC

## 2024-02-08 MED ORDER — BASAGLAR KWIKPEN 100 UNIT/ML ~~LOC~~ SOPN: 10.0000 [IU] | PEN_INJECTOR | Freq: Every day | SUBCUTANEOUS | 1 refills | Status: DC

## 2024-02-08 MED ORDER — PEN NEEDLES 32G X 4 MM MISC: 3 refills | Status: DC

## 2024-02-08 MED ORDER — METFORMIN HCL 1000 MG PO TABS: 1000.0000 mg | ORAL_TABLET | Freq: Two times a day (BID) | ORAL | 1 refills | Status: DC

## 2024-03-09 ENCOUNTER — Encounter: Payer: Self-pay | Admitting: Gastroenterology

## 2024-03-20 ENCOUNTER — Telehealth: Payer: Self-pay | Admitting: Family Medicine

## 2024-03-20 NOTE — Telephone Encounter (Signed)
 Confirmed appt for 10/7

## 2024-03-21 ENCOUNTER — Ambulatory Visit: Payer: Self-pay | Attending: Family Medicine | Admitting: Pharmacist

## 2024-03-21 ENCOUNTER — Encounter: Payer: Self-pay | Admitting: Pharmacist

## 2024-03-21 DIAGNOSIS — Z7984 Long term (current) use of oral hypoglycemic drugs: Secondary | ICD-10-CM

## 2024-03-21 DIAGNOSIS — K219 Gastro-esophageal reflux disease without esophagitis: Secondary | ICD-10-CM

## 2024-03-21 DIAGNOSIS — E1149 Type 2 diabetes mellitus with other diabetic neurological complication: Secondary | ICD-10-CM

## 2024-03-21 LAB — POCT GLYCOSYLATED HEMOGLOBIN (HGB A1C): HbA1c, POC (controlled diabetic range): 9.1 % — AB (ref 0.0–7.0)

## 2024-03-21 MED ORDER — PANTOPRAZOLE SODIUM 40 MG PO TBEC: 40.0000 mg | DELAYED_RELEASE_TABLET | Freq: Two times a day (BID) | ORAL | 3 refills | Status: AC

## 2024-03-21 NOTE — Progress Notes (Signed)
 S:    AMN interpretersBETHA Douglas, [name]  No chief complaint on file.  63 y.o. female with PMHx significant for HTN, GERD, T2DM, HLD, hepatic steatosis, and history of uterine cancer/endometrial/colon cancer who presents for diabetes evaluation, education, and management.    Last seen by pharmacist on 02/08/24. At that visit, we started the patient on insulin . Of note, we have tried to get her access to GLP-1 RA therapy in the past but this was cost prohibitive. She has an unmet deductible and Ozempic  copay was >$900 for 1 month supply.   Today, patient reports that she is doing well. She has no complaints regarding her DM.  Of note, she never started the insulin . She tells me today that she used Lantus  in the evenings at 8 units once daily in the past and this caused her sugar to drop. Tells me that the insulin  made her sugar decrease to 90 mg/dL and she became symptomatic. Otherwise, she denies any symptoms of hyper- or hypoglycemia currently. Endorses adherence to her metformin , glipizide , and pioglitazone .   Insurance: [facility]   Patient reports monitoring blood sugars at home,:  Fasting: 120s  Post-prandial/random: 200s mostly. Sees numbers in the 300s.   Denies hypoglycemic events.  Patient denies nocturia (nighttime urination) (once a night).  Patient denies neuropathy. Has improved from baseline.  Patient denies visual changes. Patient denies self foot exams.   Current diabetes medications include:  glipizide  10 mg XL daily, metformin  1000 mg BID, pioglitazone  15 mg daily.   Family/Social History:  Fhx: DM, cirrhosis Tobacco: has never smoked Alcohol:  none  Patient reported dietary habits: eats 3 meals/day but eating smaller portions now  Breakfast: eggs, tortillas sometimes cereal and milk  Lunch: chicken, salad, may be soups  Dinner: fruits, tortillas, beans, eggs Snacks: peanuts (unsalted) Drinks: 3 bottles of water /day; orange or grapefruit juice  occasionally, 1 glass with a meal at most   Patient-reported exercise habits: treadmill for 120 minutes Monday-Friday.   O:  Lab Results  Component Value Date   HGBA1C 9.1 (A) 03/21/2024   There were no vitals filed for this visit.  Lipid Panel     Component Value Date/Time   CHOL 159 07/22/2023 1227   TRIG 235 (H) 07/22/2023 1227   HDL 41 07/22/2023 1227   CHOLHDL 3.9 07/22/2023 1227   CHOLHDL 6.7 (H) 06/04/2015 0925   VLDL 67 (H) 06/04/2015 0925   LDLCALC 79 07/22/2023 1227    Clinical Atherosclerotic Cardiovascular Disease (ASCVD): No  The 10-year ASCVD risk score (Arnett DK, et al., 2019) is: 10.7%   Values used to calculate the score:     Age: 24 years     Clincally relevant sex: Female     Is Non-Hispanic African American: No     Diabetic: Yes     Tobacco smoker: No     Systolic Blood Pressure: 137 mmHg     Is BP treated: Yes     HDL Cholesterol: 41 mg/dL     Total Cholesterol: 159 mg/dL   Patient is participating in a Managed Medicaid Plan:  No   A/P: Diabetes longstanding currently uncontrolled with an A1c of 9.1% today, improved from 10.1% in July of this year. A1c goal is <7%. Medication adherence is suboptimal and she is fearful that insulin  will drop her insulin  too low. She is not symptomatic from a hyper- or hypoglycemia standpoint but can verbalize appropriate hypoglycemia management plan. Pt is having post-prandial spikes in the 200s-300s  and would likely benefit from a basal insulin  for better glycemic control throughout the day. I recommend she try a very small dose of insulin  dosed in the AM to help avoid hypoglycemia. After discussion, she is amenable to trying this. -Initiated insulin  glargine (Basaglar ) 6 units daily in the morning  -Continued Glipizide  XR 10 mg once in the morning  -Continued metformin  IR 1000 mg BID  -Continued pioglitazone  15 mg daily  -Patient educated on purpose, proper use, and potential adverse effects of Basaglar , metformin ,  glipizide , and pioglitazone . -Extensively discussed pathophysiology of diabetes, recommended lifestyle interventions, dietary effects on blood sugar control.  -Counseled on s/sx of and management of hypoglycemia.  -Next A1c anticipated 06/2024.  Follow-up: Return to pharmacy clinic on 04/27/2024  \ Herlene Fleeta Morris, PharmD, [place], CPP Clinical Pharmacist [facility] [phone]

## 2024-03-31 ENCOUNTER — Emergency Department (HOSPITAL_COMMUNITY)
Admission: EM | Admit: 2024-03-31 | Discharge: 2024-04-01 | Disposition: A | Discharge status: Home / Self Care | Payer: Self-pay | Attending: Emergency Medicine | Admitting: Emergency Medicine

## 2024-03-31 DIAGNOSIS — I10 Essential (primary) hypertension: Secondary | ICD-10-CM | POA: Insufficient documentation

## 2024-03-31 DIAGNOSIS — R300 Dysuria: Secondary | ICD-10-CM | POA: Diagnosis present

## 2024-03-31 DIAGNOSIS — Z7984 Long term (current) use of oral hypoglycemic drugs: Secondary | ICD-10-CM | POA: Insufficient documentation

## 2024-03-31 DIAGNOSIS — Z85038 Personal history of other malignant neoplasm of large intestine: Secondary | ICD-10-CM | POA: Diagnosis not present

## 2024-03-31 DIAGNOSIS — E119 Type 2 diabetes mellitus without complications: Secondary | ICD-10-CM | POA: Diagnosis not present

## 2024-03-31 DIAGNOSIS — Z8542 Personal history of malignant neoplasm of other parts of uterus: Secondary | ICD-10-CM | POA: Diagnosis not present

## 2024-03-31 DIAGNOSIS — N12 Tubulo-interstitial nephritis, not specified as acute or chronic: Secondary | ICD-10-CM | POA: Diagnosis not present

## 2024-03-31 DIAGNOSIS — Z794 Long term (current) use of insulin: Secondary | ICD-10-CM | POA: Insufficient documentation

## 2024-03-31 NOTE — ED Notes (Signed)
 c

## 2024-03-31 NOTE — ED Triage Notes (Signed)
 Speaks spanish abd pain for 3 days no nausea or vomiting

## 2024-04-01 LAB — BASIC METABOLIC PANEL WITH GFR
Anion gap: 15 (ref 5–15)
BUN: 17 mg/dL (ref 8–23)
CO2: 21 mmol/L — ABNORMAL LOW (ref 22–32)
Calcium: 9.1 mg/dL (ref 8.9–10.3)
Chloride: 100 mmol/L (ref 98–111)
Creatinine, Ser: 0.76 mg/dL (ref 0.44–1.00)
GFR, Estimated: >60 mL/min (ref >60)
Glucose, Bld: 233 mg/dL — ABNORMAL HIGH (ref 70–99)
Potassium: 3.1 mmol/L — ABNORMAL LOW (ref 3.5–5.1)
Sodium: 136 mmol/L (ref 135–145)

## 2024-04-01 LAB — URINALYSIS, ROUTINE W REFLEX MICROSCOPIC
Bilirubin Urine: NEGATIVE
Glucose, UA: 50 mg/dL — AB
Ketones, ur: NEGATIVE mg/dL
Leukocytes,Ua: NEGATIVE
Nitrite: POSITIVE — AB
Protein, ur: 100 mg/dL — AB
RBC / HPF: >50 RBC/hpf (ref 0–5)
Specific Gravity, Urine: 1.021 (ref 1.005–1.030)
WBC, UA: >50 WBC/hpf (ref 0–5)
pH: 5 (ref 5.0–8.0)

## 2024-04-01 LAB — CBC
HCT: 37.8 % (ref 36.0–46.0)
Hemoglobin: 12.1 g/dL (ref 12.0–15.0)
MCH: 28.8 pg (ref 26.0–34.0)
MCHC: 32 g/dL (ref 30.0–36.0)
MCV: 90 fL (ref 80.0–100.0)
Platelets: 193 K/uL (ref 150–400)
RBC: 4.2 MIL/uL (ref 3.87–5.11)
RDW: 13.4 % (ref 11.5–15.5)
WBC: 6.4 K/uL (ref 4.0–10.5)
nRBC: 0 % (ref 0.0–0.2)

## 2024-04-01 MED ORDER — POTASSIUM CHLORIDE CRYS ER 20 MEQ PO TBCR
40.0000 meq | EXTENDED_RELEASE_TABLET | Freq: Once | ORAL | Status: AC
  Administered 2024-04-01: 40 meq via ORAL
  Filled 2024-04-01: qty 2

## 2024-04-01 MED ORDER — KETOROLAC TROMETHAMINE 60 MG/2ML IM SOLN
60.0000 mg | Freq: Once | INTRAMUSCULAR | Status: AC
  Administered 2024-04-01: 60 mg via INTRAMUSCULAR
  Filled 2024-04-01: qty 2

## 2024-04-01 MED ORDER — CEPHALEXIN 250 MG PO CAPS
500.0000 mg | ORAL_CAPSULE | Freq: Once | ORAL | Status: AC
  Administered 2024-04-01: 500 mg via ORAL
  Filled 2024-04-01: qty 2

## 2024-04-01 MED ORDER — CEPHALEXIN 500 MG PO CAPS: 500.0000 mg | ORAL_CAPSULE | Freq: Three times a day (TID) | ORAL | 0 refills | Status: AC

## 2024-04-01 MED ORDER — PHENAZOPYRIDINE HCL 200 MG PO TABS: 200.0000 mg | ORAL_TABLET | Freq: Three times a day (TID) | ORAL | 0 refills | Status: DC

## 2024-04-01 NOTE — Discharge Instructions (Signed)
For your pain, you may take up to 1000mg of acetaminophen (tylenol) 4 times daily for up to a week. This is the maximum dose of acetminophen (tylenol) you can take from all sources. Please check other over-the-counter medications and prescriptions to ensure you are not taking other medications that contain acetaminophen.  You may also take ibuprofen 400 mg 6 times a day OR 600mg 4 times a day alternating with or at the same time as tylenol.   

## 2024-04-01 NOTE — ED Provider Notes (Signed)
 [place] EMERGENCY DEPARTMENT AT [facility] Provider Note   CSN: [id number] Arrival date & time: 03/31/24  2326     Patient presents with: Abdominal Pain and Dysuria   Laura Douglas is a 63 y.o. female.  {Add pertinent medical, surgical, social history, OB history to HPI:32947} HPI     63yo female with history of type II DM, hypertension, hyperlipidemia, uterine cancer, colon cancer (Lynch syndrome) who presents with concern for dysuria, abdominal pain.    Reports vaginal pain but clarifies it is more pain with urination.   No vaginal bleeding or discharge Does have some flank pain, just on the left side  Last night was very severe  Comes and goes No hx of kidney stones Bought some medications that help, miconazole and AZO No fever No nausea or vomiting No diarrhea or constipation Working with machines, f  Past Medical History:  Diagnosis Date   Blood transfusion without reported diagnosis    Colon cancer (HCC)    possible? Patient had surgery in [place]. Denies needing adjuvant therapy. The surgery removed a tumor from the colon.   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 Authorizing Provider  atorvastatin  (LIPITOR) 40 MG tablet Take 1 tablet by mouth once daily 02/08/24   Newlin, Enobong, MD  Blood Glucose Monitoring Suppl (AGAMATRIX PRESTO) w/Device KIT Check sugars before meals twice sugars 08/17/16   Adella Norris, MD  glipiZIDE  (GLUCOTROL  XL) 10 MG 24 hr tablet Take 1 tablet (10 mg total) by mouth daily with breakfast. 02/08/24   Delbert Clam, MD  hydrochlorothiazide  (HYDRODIURIL ) 25 MG tablet Take 1 tablet by mouth once daily 12/28/23   Newlin, Enobong, MD   Insulin  Glargine (BASAGLAR  KWIKPEN) 100 UNIT/ML Inject 10 Units into the skin daily. 02/08/24   Newlin, Enobong, MD  Insulin  Pen Needle (PEN NEEDLES) 32G X 4 MM MISC Use to inject insulin  once daily. 02/08/24   Newlin, Enobong, MD  lisinopril  (ZESTRIL ) 5 MG tablet TAKE 1 TABLET BY MOUTH ONCE DAILY WITH HCTZ 01/10/24   Newlin, Enobong, MD  metFORMIN  (GLUCOPHAGE ) 1000 MG tablet Take 1 tablet (1,000 mg total) by mouth 2 (two) times daily with a meal. 02/08/24   Delbert Clam, MD  pantoprazole  (PROTONIX ) 40 MG tablet Take 1 tablet (40 mg total) by mouth 2 (two) times daily before a meal. 03/21/24   Delbert Clam, MD  pioglitazone  (ACTOS ) 15 MG tablet Take 1 tablet (15 mg total) by mouth daily. 12/29/23   Newlin, Enobong, MD    Allergies: Patient has no known allergies.    Review of Systems  Updated Vital Signs BP 139/85   Pulse 93   Temp 97.8 F (36.6 C) (Oral)   Resp 17   LMP  (LMP Unknown)   SpO2 96%   Physical Exam  (all labs ordered are listed, but only abnormal results are displayed) Labs Reviewed  URINALYSIS, ROUTINE W REFLEX MICROSCOPIC - Abnormal; Notable for the following components:      Result Value   Color, Urine AMBER (*)    APPearance HAZY (*)    Glucose, UA 50 (*)    Hgb urine dipstick  MODERATE (*)    Protein, ur 100 (*)    Nitrite POSITIVE (*)    Bacteria, UA MANY (*)    All other components within normal limits  BASIC METABOLIC PANEL WITH GFR - Abnormal; Notable for the following components:   Potassium 3.1 (*)    CO2 21 (*)    Glucose, Bld 233 (*)    All other components within normal limits  CBC    EKG: None  Radiology: No results found.  {Document cardiac monitor, telemetry assessment procedure when appropriate:32947} Procedures   Medications Ordered in the ED - No data to display    {Click here for ABCD2, HEART and other calculators REFRESH Note before signing:1}                              Medical Decision Making Amount and/or Complexity of  Data Reviewed Labs: ordered.   ***  {Document critical care time when appropriate  Document review of labs and clinical decision tools ie CHADS2VASC2, etc  Document your independent review of radiology images and any outside records  Document your discussion with family members, caretakers and with consultants  Document social determinants of health affecting pt's care  Document your decision making why or why not admission, treatments were needed:32947:::1}   Final diagnoses:  None    ED Discharge Orders     None

## 2024-04-01 NOTE — ED Triage Notes (Signed)
 POV complaints of dysuria, for a week, burning itching and discomfort.   Pt as treated with AZO, no relief.   Alert and orient on triage, vaginal pain 10 out 10.

## 2024-04-01 NOTE — ED Notes (Signed)
 Pt given discharge instructions her and spouse verbalized understanding. Pt was offered wheelchair but declined.

## 2024-04-05 ENCOUNTER — Other Ambulatory Visit: Payer: Self-pay | Admitting: Family Medicine

## 2024-04-05 DIAGNOSIS — I10 Essential (primary) hypertension: Secondary | ICD-10-CM

## 2024-04-17 ENCOUNTER — Encounter (HOSPITAL_COMMUNITY): Payer: Self-pay | Admitting: *Deleted

## 2024-04-17 ENCOUNTER — Ambulatory Visit (HOSPITAL_COMMUNITY)
Admission: EM | Admit: 2024-04-17 | Discharge: 2024-04-17 | Disposition: A | Discharge status: Home / Self Care | Attending: Internal Medicine | Admitting: Internal Medicine

## 2024-04-17 DIAGNOSIS — B379 Candidiasis, unspecified: Secondary | ICD-10-CM | POA: Diagnosis not present

## 2024-04-17 DIAGNOSIS — T3695XA Adverse effect of unspecified systemic antibiotic, initial encounter: Secondary | ICD-10-CM | POA: Diagnosis not present

## 2024-04-17 DIAGNOSIS — N898 Other specified noninflammatory disorders of vagina: Secondary | ICD-10-CM

## 2024-04-17 DIAGNOSIS — E11628 Type 2 diabetes mellitus with other skin complications: Secondary | ICD-10-CM

## 2024-04-17 MED ORDER — FLUCONAZOLE 150 MG PO TABS: 150.0000 mg | ORAL_TABLET | ORAL | 0 refills | Status: DC

## 2024-04-17 NOTE — ED Provider Notes (Signed)
 [facility]    CSN: [id number] Arrival date & time: 04/17/24  1210      History   Chief Complaint Chief Complaint  Patient presents with   Vaginal Itching    HPI Laura Douglas is a 63 y.o. female.   Laura Douglas is a 63 y.o. female with history of type 2 diabetes presenting for chief complaint of Vaginal Itching that started approximately 1 week ago.  Patient was recently treated for pyelonephritis with Keflex 3 times daily for 7 days.  Vaginal discharge and vaginal itching started after finishing antibiotics.  Vaginal discharge is small in amount and is thin/white and without odor.  She denies pelvic pain, abdominal pain, urinary symptoms, flank pain, dizziness, fever/chills, and concern for STI.  Denies vaginal rash or open sores/lesions to the vaginal region.  She has not attempted treatment of symptoms at home.  The history is provided by the patient. The history is limited by a language barrier. A language interpreter was used (Spanish medical interpreter used for entirety of patient encounter.).  Vaginal Itching    Past Medical History:  Diagnosis Date   Blood transfusion without reported diagnosis    Colon cancer [facility])    possible? Patient had surgery in [place]. Denies needing adjuvant therapy. The surgery removed a tumor from the colon.   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    Patient Active Problem List   Diagnosis Date Noted   Hyperlipidemia associated with type 2 diabetes mellitus (HCC) 04/22/2021   Gastritis and gastroduodenitis 11/05/2019   Helicobacter pylori infection 11/05/2019   Abnormal colonoscopy 11/05/2019   Other constipation 11/05/2019   Lynch syndrome 11/02/2018   History of colon cancer 11/02/2018   History of  colonic polyps 11/02/2018   Facial lesion 11/02/2018   Peripheral edema 01/06/2018   Hepatic steatosis 03/11/2017   Dyspareunia, female 09/01/2016   Pain in joint, pelvic region and thigh 09/01/2016   Hypertension complicating diabetes (HCC) 08/17/2016   Pure hypercholesterolemia 06/05/2015   Overweight 06/03/2015   Diabetes mellitus (HCC) 06/03/2015   Back pain 06/03/2015   GERD (gastroesophageal reflux disease) 06/03/2015   Genetic testing 10/26/2014   Uterine cancer (HCC)    Family history of uterine cancer    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   Endometrial cancer (HCC) 09/11/2014   Colon cancer (HCC) 08/10/2007    Past Surgical History:  Procedure Laterality Date   ABDOMINAL HYSTERECTOMY     abortions     twice    arm surgery     left arm   COLON SURGERY     COLON SURGERY  2010   tumor removal   COLONOSCOPY  02/17/2018   Mansouraty ta x 2, hems   FRACTURE SURGERY  2006   Lt lower arm   MULTIPLE TOOTH EXTRACTIONS     ROBOTIC ASSISTED TOTAL HYSTERECTOMY WITH BILATERAL SALPINGO OOPHERECTOMY Bilateral 09/11/2014   Procedure: ROBOTIC ASSISTED TOTAL LAPAROSCOPIC HYSTERECTOMY WITH BILATERAL SALPINGO OOPHORECTOMY LYMPH NODE DISSECTION; LYSIS OF ADHESIONS;  Surgeon: Sari Bachelor, MD;  Location: [facility];  Service: Gynecology;  Laterality: Bilateral;   TUBAL LIGATION  1993   UPPER GASTROINTESTINAL ENDOSCOPY      OB History     Gravida  6   Para  4   Term  4   Preterm  0   AB  2   Living  3      SAB  2   IAB  0   Ectopic  0   Multiple      Live Births  3            Home Medications    Prior to Admission medications   Medication Sig Start Date End Date Taking? Authorizing Provider  atorvastatin  (LIPITOR) 40 MG tablet Take 1 tablet by mouth once daily 02/08/24  Yes Newlin, Enobong, MD  Blood Glucose Monitoring Suppl (AGAMATRIX PRESTO) w/Device KIT Check sugars before meals twice sugars 08/17/16  Yes Adella Norris, MD  fluconazole   (DIFLUCAN ) 150 MG tablet Take 1 tablet (150 mg total) by mouth every 3 (three) days. 04/17/24  Yes Enedelia Dorna HERO, FNP  glipiZIDE  (GLUCOTROL  XL) 10 MG 24 hr tablet Take 1 tablet (10 mg total) by mouth daily with breakfast. 02/08/24  Yes Newlin, Enobong, MD  hydrochlorothiazide  (HYDRODIURIL ) 25 MG tablet Take 1 tablet by mouth once daily 12/28/23  Yes Newlin, Enobong, MD  Insulin  Glargine (BASAGLAR  KWIKPEN) 100 UNIT/ML Inject 10 Units into the skin daily. 02/08/24  Yes Newlin, Enobong, MD  Insulin  Pen Needle (PEN NEEDLES) 32G X 4 MM MISC Use to inject insulin  once daily. 02/08/24  Yes Newlin, Enobong, MD  lisinopril  (ZESTRIL ) 5 MG tablet TAKE 1 TABLET BY MOUTH ONCE DAILY WITH HCTZ 04/05/24  Yes Newlin, Enobong, MD  metFORMIN  (GLUCOPHAGE ) 1000 MG tablet Take 1 tablet (1,000 mg total) by mouth 2 (two) times daily with a meal. 02/08/24  Yes Delbert Clam, MD  pantoprazole  (PROTONIX ) 40 MG tablet Take 1 tablet (40 mg total) by mouth 2 (two) times daily before a meal. 03/21/24  Yes Newlin, Enobong, MD  phenazopyridine (PYRIDIUM) 200 MG tablet Take 1 tablet (200 mg total) by mouth 3 (three) times daily. 04/01/24  Yes Dreama Longs, MD  pioglitazone  (ACTOS ) 15 MG tablet Take 1 tablet (15 mg total) by mouth daily. 12/29/23  Yes Delbert Clam, MD    Family History Family History  Problem Relation Age of Onset   Diabetes Mother    Prostate cancer Father    Uterine cancer Sister 45   Cancer Sister        colon cancer as well age 4?   Colon cancer Sister    Alcohol abuse Brother    Cirrhosis Brother        cause of death   Uterine cancer Maternal Aunt    Breast cancer Neg Hx    Rectal cancer Neg Hx    Stomach cancer Neg Hx    Esophageal cancer Neg Hx     Social History Social History   Tobacco Use   Smoking status: Never   Smokeless tobacco: Never  Vaping Use   Vaping status: Never Used  Substance Use Topics   Alcohol use: No   Drug use: No     Allergies   Patient has no known  allergies.   Review of Systems Review of Systems Per HPI  Physical Exam Triage Vital Signs ED Triage Vitals  Encounter Vitals Group     BP 04/17/24 1336 127/82     Girls Systolic BP Percentile --      Girls Diastolic BP Percentile --      Boys Systolic BP Percentile --      Boys Diastolic BP Percentile --  Pulse Rate 04/17/24 1336 93     Resp 04/17/24 1336 16     Temp 04/17/24 1336 99 F (37.2 C)     Temp Source 04/17/24 1336 Oral     SpO2 04/17/24 1336 97 %     Weight --      Height --      Head Circumference --      Peak Flow --      Pain Score 04/17/24 1335 0     Pain Loc --      Pain Education --      Exclude from Growth Chart --    No data found.  Updated Vital Signs BP 127/82 (BP Location: Left Arm)   Pulse 93   Temp 99 F (37.2 C) (Oral)   Resp 16   LMP  (LMP Unknown)   SpO2 97%   Visual Acuity Right Eye Distance:   Left Eye Distance:   Bilateral Distance:    Right Eye Near:   Left Eye Near:    Bilateral Near:     Physical Exam Vitals and nursing note reviewed.  Constitutional:      Appearance: She is not ill-appearing or toxic-appearing.  HENT:     Head: Normocephalic and atraumatic.     Right Ear: Hearing and external ear normal.     Left Ear: Hearing and external ear normal.     Nose: Nose normal.     Mouth/Throat:     Lips: Pink.  Eyes:     General: Lids are normal. Vision grossly intact. Gaze aligned appropriately.     Extraocular Movements: Extraocular movements intact.     Conjunctiva/sclera: Conjunctivae normal.  Pulmonary:     Effort: Pulmonary effort is normal.  Abdominal:     General: Bowel sounds are normal.     Palpations: Abdomen is soft.     Tenderness: There is no abdominal tenderness. There is no right CVA tenderness, left CVA tenderness or guarding.  Musculoskeletal:     Cervical back: Neck supple.  Skin:    General: Skin is warm and dry.     Capillary Refill: Capillary refill takes less than 2 seconds.      Findings: No rash.  Neurological:     General: No focal deficit present.     Mental Status: She is alert and oriented to person, place, and time. Mental status is at baseline.     Cranial Nerves: No dysarthria or facial asymmetry.  Psychiatric:        Mood and Affect: Mood normal.        Speech: Speech normal.        Behavior: Behavior normal.        Thought Content: Thought content normal.        Judgment: Judgment normal.      UC Treatments / Results  Labs (all labs ordered are listed, but only abnormal results are displayed) Labs Reviewed  CERVICOVAGINAL ANCILLARY ONLY    EKG   Radiology No results found.  Procedures Procedures (including critical care time)  Medications Ordered in UC Medications - No data to display  Initial Impression / Assessment and Plan / UC Course  I have reviewed the triage vital signs and the nursing notes.  Pertinent labs & imaging results that were available during my care of the patient were reviewed by me and considered in my medical decision making (see chart for details).   1.  Vaginal itching, antibiotic induced yeast infection BV and yeast  labs pending, no concern for STI. Will go ahead and treat with Diflucan  for yeast vaginitis given symptoms and history of same.  Discussed tips for BV/yeast prevention in the future. Recommend follow-up with OB/GYN PRN.    Counseled patient on potential for adverse effects with medications prescribed/recommended today, strict ER and return-to-clinic precautions discussed, patient verbalized understanding.    Final Clinical Impressions(s) / UC Diagnoses   Final diagnoses:  Antibiotic-induced yeast infection  Vaginal itching  Type 2 diabetes mellitus with other skin complication, without long-term current use of insulin  (HCC)     Discharge Instructions      Sospecho que sus sntomas vaginales se deben a una candidiasis vaginal.  Las candidiasis vaginales son comunes despus del uso de  antibiticos.  Se le ha recetado Diflucan  para tratarla.  Tome una pastilla hoy y otra en 3 das si an presenta sntomas vaginales.  Su muestra se ha enviado para su anlisis. El insurance claims handler en 2 o 3 das si da positivo para alguna otra infeccin y optometrist el tratamiento en ese momento.  Regrese a urgencias cuando sea necesario y consulte con su mdico de cabecera para una evaluacin y control de sus sntomas. Espero que se sienta mejor!  I suspect your vaginal symptoms are due to vaginal yeast infection.  Vaginal yeast infections are common after use of antibiotics.   Diflucan  has been sent to treat this.  Take 1 pill today, then another pill in 3 days if you are still having vaginal symptoms.   Your swab has been sent for testing, staff will call you in 2-3 days if you test positive for any other infections and will provide treatment at that time.  Return to urgent care as needed and follow-up with your primary care provider for further evaluation and management of your symptoms..  I hope you feel better!       ED Prescriptions     Medication Sig Dispense Auth. Provider   fluconazole  (DIFLUCAN ) 150 MG tablet Take 1 tablet (150 mg total) by mouth every 3 (three) days. 2 tablet Enedelia Dorna HERO, FNP      PDMP not reviewed this encounter.   Enedelia Dorna HERO, [place] 04/17/24 1425

## 2024-04-17 NOTE — ED Triage Notes (Signed)
 Professional interpreter service used for clinical intake.   Pt states she has vaginal pain, itching in vaginal area X 1 week. Pt not using any meds for sx.

## 2024-04-17 NOTE — Discharge Instructions (Addendum)
 Sospecho que sus sntomas vaginales se deben a una candidiasis vaginal.  Las candidiasis vaginales son comunes despus del uso de antibiticos.  Se le ha recetado Diflucan  para tratarla.  Tome una pastilla hoy y otra en 3 das si an presenta sntomas vaginales.  Su muestra se ha enviado para su anlisis. El insurance claims handler en 2 o 3 das si da positivo para alguna otra infeccin y optometrist el tratamiento en ese momento.  Regrese a urgencias cuando sea necesario y consulte con su mdico de cabecera para una evaluacin y control de sus sntomas. Espero que se sienta mejor!  I suspect your vaginal symptoms are due to vaginal yeast infection.  Vaginal yeast infections are common after use of antibiotics.   Diflucan  has been sent to treat this.  Take 1 pill today, then another pill in 3 days if you are still having vaginal symptoms.   Your swab has been sent for testing, staff will call you in 2-3 days if you test positive for any other infections and will provide treatment at that time.  Return to urgent care as needed and follow-up with your primary care provider for further evaluation and management of your symptoms..  I hope you feel better!

## 2024-04-26 ENCOUNTER — Telehealth: Payer: Self-pay | Admitting: Family Medicine

## 2024-04-26 NOTE — Telephone Encounter (Signed)
 Confirmed appt for 11/13

## 2024-04-27 ENCOUNTER — Encounter: Payer: Self-pay | Admitting: Pharmacist

## 2024-04-27 ENCOUNTER — Ambulatory Visit: Payer: Self-pay | Attending: Family Medicine | Admitting: Pharmacist

## 2024-04-27 DIAGNOSIS — Z1507 Genetic susceptibility to malignant neoplasm of urinary tract: Secondary | ICD-10-CM

## 2024-04-27 DIAGNOSIS — E1149 Type 2 diabetes mellitus with other diabetic neurological complication: Secondary | ICD-10-CM | POA: Diagnosis not present

## 2024-04-27 DIAGNOSIS — Z7984 Long term (current) use of oral hypoglycemic drugs: Secondary | ICD-10-CM

## 2024-04-27 DIAGNOSIS — Z794 Long term (current) use of insulin: Secondary | ICD-10-CM

## 2024-04-27 DIAGNOSIS — Z15060 Genetic susceptibility to colorectal cancer: Secondary | ICD-10-CM | POA: Diagnosis not present

## 2024-04-27 DIAGNOSIS — Z15068 Genetic susceptibility to other malignant neoplasm of digestive system: Secondary | ICD-10-CM

## 2024-04-27 DIAGNOSIS — Z1509 Genetic susceptibility to other malignant neoplasm: Secondary | ICD-10-CM

## 2024-04-27 DIAGNOSIS — Z85038 Personal history of other malignant neoplasm of large intestine: Secondary | ICD-10-CM

## 2024-04-27 MED ORDER — GLIPIZIDE ER 10 MG PO TB24: 10.0000 mg | ORAL_TABLET | Freq: Every day | ORAL | 1 refills | Status: AC

## 2024-04-27 MED ORDER — PEN NEEDLES 32G X 4 MM MISC: 3 refills | Status: AC

## 2024-04-27 MED ORDER — PIOGLITAZONE HCL 15 MG PO TABS: 15.0000 mg | ORAL_TABLET | Freq: Every day | ORAL | 1 refills | Status: AC

## 2024-04-27 MED ORDER — FLUCONAZOLE 150 MG PO TABS: 150.0000 mg | ORAL_TABLET | ORAL | 0 refills | Status: AC

## 2024-04-27 MED ORDER — BASAGLAR KWIKPEN 100 UNIT/ML ~~LOC~~ SOPN: 10.0000 [IU] | PEN_INJECTOR | Freq: Every day | SUBCUTANEOUS | 1 refills | Status: DC

## 2024-04-27 MED ORDER — METFORMIN HCL 1000 MG PO TABS: 1000.0000 mg | ORAL_TABLET | Freq: Two times a day (BID) | ORAL | 1 refills | Status: AC

## 2024-04-27 NOTE — Progress Notes (Signed)
 S:    AMN interpetersBETHA Bitters, [id number]  No chief complaint on file.  63 y.o. female with PMHx significant for HTN, GERD, T2DM, HLD, hepatic steatosis, and history of uterine cancer/endometrial/colon cancer who presents for diabetes evaluation, education, and management.    Last seen by pharmacist on 03/21/2024. At that appt, A1c was 9.1%, down from 10.1 prior. We started her on Basaglar .   Today, patient reports that she is doing well. She has no complaints regarding her DM.  Of note, she did pick up 90-days worth of the insulin  on 03/21/2024. She tells me today that she is using Basaglar  in the evenings at 8 units. However, she only uses the insulin  every other day. She still has a fear of hypoglycemia associated with insulin  use. Tells me that she will only use the insulin  when she feels bad or notices her sugar is too high.  Endorses adherence to her metformin , glipizide . She is not taking the pioglitazone  due to her belief that she's taking too many medications.   Insurance: [facility]   Patient reports monitoring blood sugars at home,:  Fasting: 120s - 130s.  Post-prandial/random: names numbers in the low 200s, gives 210 as an example.   Denies hypoglycemic events.  Patient denies nocturia (nighttime urination) (once a night).  Patient denies neuropathy. Has improved from baseline.  Patient denies visual changes. Patient denies self foot exams.   Current diabetes medications include:  Basaglar  6 units daily (takes 8 units once every other days), glipizide  10 mg XL daily, metformin  1000 mg BID, pioglitazone  15 mg daily (has not been taking due to not wanting to take too much medication).  Prefers to fill at [facility].   Family/Social History:  Fhx: DM, cirrhosis Tobacco: has never smoked Alcohol:  none  Patient reported dietary habits: eats 3 meals/day but eating smaller portions now  Breakfast: eggs, tortillas sometimes cereal and milk  Lunch: chicken, salad, may  be soups  Dinner: fruits, tortillas, beans, eggs Snacks: peanuts (unsalted) Drinks: 3 bottles of water /day; orange or grapefruit juice occasionally, 1 glass with a meal at most   Patient-reported exercise habits: treadmill for 120 minutes Monday-Friday.   O:  Lab Results  Component Value Date   HGBA1C 9.1 (A) 03/21/2024   There were no vitals filed for this visit.  Lipid Panel     Component Value Date/Time   CHOL 159 07/22/2023 1227   TRIG 235 (H) 07/22/2023 1227   HDL 41 07/22/2023 1227   CHOLHDL 3.9 07/22/2023 1227   CHOLHDL 6.7 (H) 06/04/2015 0925   VLDL 67 (H) 06/04/2015 0925   LDLCALC 79 07/22/2023 1227    Clinical Atherosclerotic Cardiovascular Disease (ASCVD): No  The 10-year ASCVD risk score (Arnett DK, et al., 2019) is: 9.3%   Values used to calculate the score:     Age: 45 years     Clincally relevant sex: Female     Is Non-Hispanic African American: No     Diabetic: Yes     Tobacco smoker: No     Systolic Blood Pressure: 127 mmHg     Is BP treated: Yes     HDL Cholesterol: 41 mg/dL     Total Cholesterol: 159 mg/dL   Patient is participating in a Managed Medicaid Plan:  No   A/P: Diabetes longstanding currently uncontrolled with an A1c of 9.1% last month. A1c goal is <7%. Medication adherence is suboptimal and she is fearful that insulin  will drop her insulin  too low. She is  also not taking pioglitazone  as she believes she is taking too many medications. She is not symptomatic from a hyper- or hypoglycemia standpoint but can verbalize appropriate hypoglycemia management plan.  -Continue (Basaglar ) 6 units daily in the morning. Encouraged patient to take daily. -Continued Glipizide  XR 10 mg once in the morning  -Continued metformin  1000 mg BID  -Restarted pioglitazone  15 mg daily  -Patient educated on purpose, proper use, and potential adverse effects of Basaglar , metformin , glipizide , and pioglitazone . -Extensively discussed pathophysiology of diabetes,  recommended lifestyle interventions, dietary effects on blood sugar control.  -Counseled on s/sx of and management of hypoglycemia.  -Next A1c anticipated 06/2024.  Follow-up:  -PCP 05/10/24 -Return to pharmacy clinic in 1 month   Herlene Fleeta Morris, PharmD, [place], CPP Clinical Pharmacist [facility] [phone]

## 2024-05-10 ENCOUNTER — Other Ambulatory Visit (HOSPITAL_COMMUNITY)
Admission: RE | Admit: 2024-05-10 | Discharge: 2024-05-10 | Disposition: A | Discharge status: Home / Self Care | Source: Ambulatory Visit | Attending: Family Medicine | Admitting: Family Medicine

## 2024-05-10 ENCOUNTER — Ambulatory Visit: Attending: Family Medicine | Admitting: Family Medicine

## 2024-05-10 ENCOUNTER — Encounter: Payer: Self-pay | Admitting: Family Medicine

## 2024-05-10 VITALS — BP 111/71 | HR 102 | Temp 98.4°F | Ht 64.0 in | Wt 140.8 lb

## 2024-05-10 DIAGNOSIS — Z23 Encounter for immunization: Secondary | ICD-10-CM

## 2024-05-10 DIAGNOSIS — E1169 Type 2 diabetes mellitus with other specified complication: Secondary | ICD-10-CM

## 2024-05-10 DIAGNOSIS — E785 Hyperlipidemia, unspecified: Secondary | ICD-10-CM

## 2024-05-10 DIAGNOSIS — Z713 Dietary counseling and surveillance: Secondary | ICD-10-CM

## 2024-05-10 DIAGNOSIS — E876 Hypokalemia: Secondary | ICD-10-CM | POA: Diagnosis not present

## 2024-05-10 DIAGNOSIS — N898 Other specified noninflammatory disorders of vagina: Secondary | ICD-10-CM | POA: Insufficient documentation

## 2024-05-10 DIAGNOSIS — Z794 Long term (current) use of insulin: Secondary | ICD-10-CM

## 2024-05-10 DIAGNOSIS — Z15060 Genetic susceptibility to colorectal cancer: Secondary | ICD-10-CM

## 2024-05-10 DIAGNOSIS — R309 Painful micturition, unspecified: Secondary | ICD-10-CM | POA: Diagnosis not present

## 2024-05-10 DIAGNOSIS — I10 Essential (primary) hypertension: Secondary | ICD-10-CM

## 2024-05-10 DIAGNOSIS — E1149 Type 2 diabetes mellitus with other diabetic neurological complication: Secondary | ICD-10-CM

## 2024-05-10 DIAGNOSIS — E1159 Type 2 diabetes mellitus with other circulatory complications: Secondary | ICD-10-CM

## 2024-05-10 LAB — POCT URINALYSIS DIP (CLINITEK)
Bilirubin, UA: NEGATIVE
Glucose, UA: NEGATIVE mg/dL
Ketones, POC UA: NEGATIVE mg/dL
Nitrite, UA: NEGATIVE
POC PROTEIN,UA: NEGATIVE
Spec Grav, UA: 1.01 (ref 1.010–1.025)
Urobilinogen, UA: 0.2 U/dL
pH, UA: 5.5 (ref 5.0–8.0)

## 2024-05-10 NOTE — Patient Instructions (Signed)
Please call to schedule your Colonoscopy: [facility] [address] [place], [place] [address] PH# [phone]

## 2024-05-10 NOTE — Progress Notes (Signed)
 Subjective:  Patient ID: Laura Douglas, female    DOB: 1961-05-18  Age: 63 y.o. MRN: [id number]  CC: Medical Management of Chronic Issues (Pelvic pain /Pain for urination X  23month)     Discussed the use of AI scribe software for clinical note transcription with the patient, who gave verbal consent to proceed.  History of Present Illness Laura Douglas is a 63 year old female with a history of Lynch syndrome, colon cancer in 2009 (status post resection), endometrial cancer (status post TAHBSO LND in 08/2014 with pathology revealing stage Ia grade 1 endometrioid endometrial cancer with no lymphovascular space invasion and negative lymph nodes) type 2 diabetes mellitus  who presents with urinary symptoms and follow-up on her diabetes management.  She has had dysuria and urinary urgency for the past month despite treatment from urgent care. She denies hematuria.  1 month ago she was treated for pyelonephritis with Keflex  and 2 weeks ago was treated for antibiotic induced yeast infection at urgent care visits. She also has vaginal discharge, pruritus, and a throbbing sensation around the vaginal area.  Her diabetes is poorly controlled with a recent A1c of 9.1%. She was not taking her medications as prescribed and taking her Lantus  every other day, not taking Actos  but has been taking them regularly since a visit with a clinical pharmacist two weeks ago. She remains adherent with her statin and antihypertensive. She has Lynch syndrome and is due for a colonoscopy, which has not yet been scheduled.  Her last potassium level was low at 3.1 at her last urgent care visit and she is due for a recheck today.    Past Medical History:  Diagnosis Date   Blood transfusion without reported diagnosis    Colon cancer [facility])    possible? Patient had surgery in [place]. Denies needing adjuvant therapy. The surgery removed a tumor from the colon.   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    Past Surgical History:  Procedure Laterality Date   ABDOMINAL HYSTERECTOMY     abortions     twice    arm surgery     left arm   COLON SURGERY     COLON SURGERY  2010   tumor removal   COLONOSCOPY  02/17/2018   Mansouraty ta x 2, hems   FRACTURE SURGERY  2006   Lt lower arm   MULTIPLE TOOTH EXTRACTIONS     ROBOTIC ASSISTED TOTAL HYSTERECTOMY WITH BILATERAL SALPINGO OOPHERECTOMY Bilateral 09/11/2014   Procedure: ROBOTIC ASSISTED TOTAL LAPAROSCOPIC HYSTERECTOMY WITH BILATERAL SALPINGO OOPHORECTOMY LYMPH NODE DISSECTION; LYSIS OF ADHESIONS;  Surgeon: Sari Bachelor, MD;  Location: [facility];  Service: Gynecology;  Laterality: Bilateral;   TUBAL LIGATION  1993   UPPER GASTROINTESTINAL ENDOSCOPY      Family History  Problem Relation Age of Onset   Diabetes Mother    Prostate cancer Father    Uterine cancer Sister 29   Cancer Sister        colon cancer as well age 58?   Colon cancer Sister    Alcohol abuse Brother    Cirrhosis Brother        cause of death   Uterine cancer Maternal Aunt  Breast cancer Neg Hx    Rectal cancer Neg Hx    Stomach cancer Neg Hx    Esophageal cancer Neg Hx     Social History   Socioeconomic History   Marital status: Married    Spouse name: Not on file   Number of children: 4   Years of education: Not on file   Highest education level: Not on file  Occupational History   Occupation: Factory work.  Tobacco Use   Smoking status: Never   Smokeless tobacco: Never  Vaping Use   Vaping status: Never Used  Substance and Sexual Activity   Alcohol use: No   Drug use: No   Sexual activity: Yes    Birth control/protection: Surgical    Comment: Hysterectomy  Other Topics Concern   Not on file  Social History Narrative   Lives at  home with her female partner   Only son killed by gang violence in [place].   Social Drivers of Health   Financial Resource Strain: Patient Declined (07/01/2023)   Overall Financial Resource Strain (CARDIA)    Difficulty of Paying Living Expenses: Patient declined  Food Insecurity: No Food Insecurity (07/01/2023)   Hunger Vital Sign    Worried About Running Out of Food in the Last Year: Never true    Ran Out of Food in the Last Year: Never true  Transportation Needs: No Transportation Needs (07/01/2023)   PRAPARE - Administrator, Civil Service (Medical): No    Lack of Transportation (Non-Medical): No  Physical Activity: Unknown (07/01/2023)   Exercise Vital Sign    Days of Exercise per Week: Not on file    Minutes of Exercise per Session: 60 min  Stress: Patient Declined (07/01/2023)   [employer] of Occupational Health - Occupational Stress Questionnaire    Feeling of Stress : Patient declined  Social Connections: Socially Integrated (07/01/2023)   Social Connection and Isolation Panel    Frequency of Communication with Friends and Family: Twice a week    Frequency of Social Gatherings with Friends and Family: Once a week    Attends Religious Services: 1 to 4 times per year    Active Member of [employer] or Organizations: Yes    Attends Banker Meetings: 1 to 4 times per year    Marital Status: Living with partner    No Known Allergies  Outpatient Medications Prior to Visit  Medication Sig Dispense Refill   atorvastatin  (LIPITOR) 40 MG tablet Take 1 tablet by mouth once daily 90 tablet 1   Blood Glucose Monitoring Suppl (AGAMATRIX PRESTO) w/Device KIT Check sugars before meals twice sugars 1 kit 0   fluconazole  (DIFLUCAN ) 150 MG tablet Take 1 tablet (150 mg total) by mouth every 3 (three) days. 2 tablet 0   glipiZIDE  (GLUCOTROL  XL) 10 MG 24 hr tablet Take 1 tablet (10 mg total) by mouth daily with breakfast. 90 tablet 1   hydrochlorothiazide  (HYDRODIURIL )  25 MG tablet Take 1 tablet by mouth once daily 90 tablet 1   Insulin  Glargine (BASAGLAR  KWIKPEN) 100 UNIT/ML Inject 10 Units into the skin daily. (Patient taking differently: Inject 8 Units into the skin daily.) 15 mL 1   Insulin  Pen Needle (PEN NEEDLES) 32G X 4 MM MISC Use to inject insulin  once daily. 100 each 3   lisinopril  (ZESTRIL ) 5 MG tablet TAKE 1 TABLET BY MOUTH ONCE DAILY WITH HCTZ 90 tablet 1   metFORMIN  (GLUCOPHAGE ) 1000 MG tablet Take 1 tablet (1,000 mg  total) by mouth 2 (two) times daily with a meal. 180 tablet 1   pantoprazole  (PROTONIX ) 40 MG tablet Take 1 tablet (40 mg total) by mouth 2 (two) times daily before a meal. 60 tablet 3   phenazopyridine (PYRIDIUM) 200 MG tablet Take 1 tablet (200 mg total) by mouth 3 (three) times daily. 6 tablet 0   pioglitazone  (ACTOS ) 15 MG tablet Take 1 tablet (15 mg total) by mouth daily. 90 tablet 1   No facility-administered medications prior to visit.     ROS Review of Systems  Constitutional:  Negative for activity change and appetite change.  HENT:  Negative for sinus pressure and sore throat.   Respiratory:  Negative for chest tightness, shortness of breath and wheezing.   Cardiovascular:  Negative for chest pain and palpitations.  Gastrointestinal:  Negative for abdominal distention, abdominal pain and constipation.  Genitourinary:  Positive for dysuria.  Musculoskeletal: Negative.   Psychiatric/Behavioral:  Negative for behavioral problems and dysphoric mood.     Objective:  BP 111/71   Pulse (!) 102   Temp 98.4 F (36.9 C) (Oral)   Ht 5' 4 (1.626 m)   Wt 140 lb 12.8 oz (63.9 kg)   LMP  (LMP Unknown)   SpO2 96%   BMI 24.17 kg/m      05/10/2024    1:56 PM 04/17/2024    1:36 PM 04/01/2024    8:15 AM  BP/Weight  Systolic BP 111 127 132  Diastolic BP 71 82 77  Wt. (Lbs) 140.8    BMI 24.17 kg/m2        Physical Exam Constitutional:      Appearance: She is well-developed.  Cardiovascular:     Rate and Rhythm:  Tachycardia present.     Heart sounds: Normal heart sounds. No murmur heard. Pulmonary:     Effort: Pulmonary effort is normal.     Breath sounds: Normal breath sounds. No wheezing or rales.  Chest:     Chest wall: No tenderness.  Abdominal:     General: Bowel sounds are normal. There is no distension.     Palpations: Abdomen is soft. There is no mass.     Tenderness: There is no abdominal tenderness.  Musculoskeletal:        General: Normal range of motion.     Right lower leg: No edema.     Left lower leg: No edema.  Neurological:     Mental Status: She is alert and oriented to person, place, and time.  Psychiatric:        Mood and Affect: Mood normal.        Latest Ref Rng & Units 04/01/2024   12:32 AM 12/29/2023   12:15 PM 06/14/2023   10:06 AM  CMP  Glucose 70 - 99 mg/dL 766  880  795   BUN 8 - 23 mg/dL 17  11  11    Creatinine 0.44 - 1.00 mg/dL 9.23  9.37  9.19   Sodium 135 - 145 mmol/L 136  143  132   Potassium 3.5 - 5.1 mmol/L 3.1  4.0  3.2   Chloride 98 - 111 mmol/L 100  104  100   CO2 22 - 32 mmol/L 21  16  21    Calcium  8.9 - 10.3 mg/dL 9.1  9.9  8.6   Total Protein 6.0 - 8.5 g/dL  7.1  6.8   Total Bilirubin 0.0 - 1.2 mg/dL  0.3  0.7   Alkaline Phos 44 - 121  IU/L  64  54   AST 0 - 40 IU/L  22  30   ALT 0 - 32 IU/L  27  30     Lipid Panel     Component Value Date/Time   CHOL 159 07/22/2023 1227   TRIG 235 (H) 07/22/2023 1227   HDL 41 07/22/2023 1227   CHOLHDL 3.9 07/22/2023 1227   CHOLHDL 6.7 (H) 06/04/2015 0925   VLDL 67 (H) 06/04/2015 0925   LDLCALC 79 07/22/2023 1227    CBC    Component Value Date/Time   WBC 6.4 04/01/2024 0032   RBC 4.20 04/01/2024 0032   HGB 12.1 04/01/2024 0032   HGB 13.7 11/17/2016 0852   HCT 37.8 04/01/2024 0032   HCT 42.8 11/17/2016 0852   PLT 193 04/01/2024 0032   PLT 237 11/17/2016 0852   MCV 90.0 04/01/2024 0032   MCV 86 11/17/2016 0852   MCH 28.8 04/01/2024 0032   MCHC 32.0 04/01/2024 0032   RDW 13.4 04/01/2024  0032   RDW 13.1 11/17/2016 0852   LYMPHSABS 1.4 06/14/2023 1006   LYMPHSABS 2.7 11/17/2016 0852   MONOABS 0.4 06/14/2023 1006   EOSABS 0.0 06/14/2023 1006   EOSABS 0.2 11/17/2016 0852   BASOSABS 0.0 06/14/2023 1006   BASOSABS 0.0 11/17/2016 0852    Lab Results  Component Value Date   HGBA1C 9.1 (A) 03/21/2024       Assessment & Plan Type 2 diabetes mellitus, poorly controlled Diabetes poorly controlled with A1c of 9.1. Non-adherence addressed, now compliant with medication. - Continue current diabetes medications as prescribed. -Counseled on Diabetic diet, the healthy plate, 849 minutes of moderate intensity exercise/week Blood sugar logs with fasting goals of 80-120 mg/dl, random of less than 819 and in the event of sugars less than 60 mg/dl or greater than 599 mg/dl encouraged to notify the clinic. Advised on the need for annual eye exams, annual foot exams, Pneumonia vaccine.   Hypertension and hyperlipidemia associated with Type 2 diabetes mellitus Blood pressure controlled.  Hyperlipidemia reveals LDL at goal of less than 100 but elevated triglycerides - Continue current antihypertensive and lipid-lowering medications. - Fish oil capsules will be beneficial -Counseled on blood pressure goal of less than 130/80, low-sodium, DASH diet, medication compliance, 150 minutes of moderate intensity exercise per week. Discussed medication compliance, adverse effects.   Painful micturition and possible urinary tract infection Urinalysis indicates possible infection. High blood sugar increases infection risk. - Ordered urine culture to confirm infection given recent antibiotic use  Vaginal itching Symptoms suggest yeast or bacterial infection. -Hyperglycemic state places her at risk for yeast infection - Performed vaginal swab to check for yeast or bacterial infection.  Hypokalemia Previous low potassium level noted - 3.1 - Rechecked potassium level.  Lynch syndrome Genetic  susceptibility to colorectal cancer  Requires regular colonoscopy due to Lynch syndrome. Previous referrals had issues reaching her. - Provided contact number for scheduling colonoscopy.      Healthcare maintenance Need for immunization against influenza-flu shot administered  No orders of the defined types were placed in this encounter.   Follow-up: Return in about 3 months (around 08/10/2024) for Chronic medical conditions.       Corrina Sabin, MD, FAAFP. [facility] [place], [place] [phone]   05/10/2024, 5:07 PM

## 2024-05-11 LAB — POTASSIUM: Potassium: 3.9 mmol/L (ref 3.5–5.2)

## 2024-05-12 ENCOUNTER — Ambulatory Visit: Payer: Self-pay | Admitting: Family Medicine

## 2024-05-12 LAB — CERVICOVAGINAL ANCILLARY ONLY
Bacterial Vaginitis (gardnerella): NEGATIVE
Candida Glabrata: NEGATIVE
Candida Vaginitis: NEGATIVE
Chlamydia: NEGATIVE
Comment: NEGATIVE
Comment: NEGATIVE
Comment: NEGATIVE
Comment: NEGATIVE
Comment: NEGATIVE
Comment: NORMAL
Neisseria Gonorrhea: NEGATIVE
Trichomonas: NEGATIVE

## 2024-05-13 LAB — URINE CULTURE

## 2024-05-15 MED ORDER — CEPHALEXIN 500 MG PO CAPS: 500.0000 mg | ORAL_CAPSULE | Freq: Two times a day (BID) | ORAL | 0 refills | Status: DC

## 2024-05-16 ENCOUNTER — Encounter: Payer: Self-pay | Admitting: Gastroenterology

## 2024-06-02 ENCOUNTER — Encounter: Payer: Self-pay | Admitting: Pharmacist

## 2024-06-02 ENCOUNTER — Ambulatory Visit: Attending: Family Medicine | Admitting: Pharmacist

## 2024-06-02 DIAGNOSIS — Z794 Long term (current) use of insulin: Secondary | ICD-10-CM | POA: Diagnosis not present

## 2024-06-02 DIAGNOSIS — E1149 Type 2 diabetes mellitus with other diabetic neurological complication: Secondary | ICD-10-CM | POA: Diagnosis not present

## 2024-06-02 DIAGNOSIS — Z7984 Long term (current) use of oral hypoglycemic drugs: Secondary | ICD-10-CM

## 2024-06-02 MED ORDER — CEPHALEXIN 500 MG PO CAPS: 500.0000 mg | ORAL_CAPSULE | Freq: Two times a day (BID) | ORAL | 0 refills | Status: DC

## 2024-06-02 MED ORDER — BASAGLAR KWIKPEN 100 UNIT/ML ~~LOC~~ SOPN: 16.0000 [IU] | PEN_INJECTOR | Freq: Every day | SUBCUTANEOUS | 1 refills | Status: AC

## 2024-06-02 NOTE — Progress Notes (Signed)
 "  S:    AMN interpeters: Jon, [id number]  No chief complaint on file.  63 y.o. female with PMHx significant for HTN, GERD, T2DM, HLD, hepatic steatosis, and history of uterine cancer/endometrial/colon cancer who presents for diabetes evaluation, education, and management.    A1c was 9.1% when I saw her 03/21/2024, down from 10.1% prior. We started her on Basaglar  at that time. I then saw her on 04/27/2024. I encouraged her to restart pioglitazone  as she was not taking it prior to that visit. I also continued her Basaglar , glipizide , and metformin .   Dr. Newlin saw her 05/10/24. She noted that Ms. Walsie was taking all of her DM medications. No changes were made. Of note, pt was given Keflex  for a UTI. She tells me today she completed her antibiotic but still has pain after urination. She is able to void her bladder completely and denies any urinary hesitancy or polyuria. Denies any itching, discharge, or hematuria.  Today, she has no complaints regarding her DM.  She seems to be doing okay with her medication adherence. She did pick up 90-days worth of the insulin  on 03/21/2024. She tells me today that she is using Basaglar  in the evenings at 10 units and she uses this every day. Endorses adherence to her metformin , glipizide , and pioglitazone .  Insurance: [facility]   Patient reports monitoring blood sugars at home,:  Fasting: 120s - 130s.  Post-prandial/random: names numbers in the low 200s-300s.   Denies hypoglycemic events. Patient denies neuropathy. Has improved from baseline.  Patient denies visual changes. Patient denies self foot exams.   Current diabetes medications include:   -Basaglar  10 units daily (filled 90-day 03/21/24) -glipizide  10 mg XL daily (filled 90-day 05/06/24) -metformin  1000 mg BID (filled 90-day 02/22/24) -pioglitazone  15 mg daily (filled 90-day 05/06/24)  Family/Social History:  Fhx: DM, cirrhosis Tobacco: has never smoked Alcohol:  none  Patient  reported dietary habits: eats 3 meals/day but eating smaller portions now  Breakfast: eggs, tortillas sometimes cereal and milk  Lunch: chicken, salad, may be soups  Dinner: fruits, tortillas, beans, eggs Snacks: peanuts (unsalted) Drinks: 3 bottles of water /day; orange or grapefruit juice occasionally, 1 glass with a meal at most   Patient-reported exercise habits: treadmill for 120 minutes Monday-Friday.   O:  Lab Results  Component Value Date   HGBA1C 9.1 (A) 03/21/2024   There were no vitals filed for this visit.  Lipid Panel     Component Value Date/Time   CHOL 159 07/22/2023 1227   TRIG 235 (H) 07/22/2023 1227   HDL 41 07/22/2023 1227   CHOLHDL 3.9 07/22/2023 1227   CHOLHDL 6.7 (H) 06/04/2015 0925   VLDL 67 (H) 06/04/2015 0925   LDLCALC 79 07/22/2023 1227    Clinical Atherosclerotic Cardiovascular Disease (ASCVD): No  The 10-year ASCVD risk score (Arnett DK, et al., 2019) is: 7.9%   Values used to calculate the score:     Age: 63 years     Clinically relevant sex: Female     Is Non-Hispanic African American: No     Diabetic: Yes     Tobacco smoker: No     Systolic Blood Pressure: 111 mmHg     Is BP treated: Yes     HDL Cholesterol: 41 mg/dL     Total Cholesterol: 159 mg/dL   Patient is participating in a Managed Medicaid Plan:  No   A/P: Diabetes longstanding currently uncontrolled with an A1c of 9.1% in October. A1c goal is <7%.  Medication adherence is improving, and I think her sugars are improving as well. However, she has ongoing dysuria and it could be that her UTI has not completely resolved in the setting of glucosuria.  She is not symptomatic from a hypoglycemia standpoint but can verbalize appropriate hypoglycemia management plan.  -INCREASE Basaglar  to 16 units daily in the morning. Encouraged patient to take daily. -Continued Glipizide  XR 10 mg once in the morning  -Continued metformin  1000 mg BID  -Continued pioglitazone  15 mg daily  -Refill of  Keflex  given. Pt encouraged to contact and make a PCP follow-up if her symptoms persist after completing antibiotics. -Patient educated on purpose, proper use, and potential adverse effects of Basaglar , metformin , glipizide , and pioglitazone . -Extensively discussed pathophysiology of diabetes, recommended lifestyle interventions, dietary effects on blood sugar control.  -Counseled on s/sx of and management of hypoglycemia.  -Next A1c anticipated 06/2024.  Follow-up:  -PCP 08/14/2024 -Me: 07/01/23  Herlene Fleeta Morris, PharmD, BCACP, CPP Clinical Pharmacist [facility] [phone]    "

## 2024-06-23 ENCOUNTER — Ambulatory Visit: Payer: Self-pay

## 2024-06-23 ENCOUNTER — Other Ambulatory Visit: Payer: Self-pay

## 2024-06-23 VITALS — Ht 60.0 in | Wt 145.0 lb

## 2024-06-23 DIAGNOSIS — Z85038 Personal history of other malignant neoplasm of large intestine: Secondary | ICD-10-CM

## 2024-06-23 MED ORDER — NA SULFATE-K SULFATE-MG SULF 17.5-3.13-1.6 GM/177ML PO SOLN: 1.0000 | Freq: Once | ORAL | 0 refills | Status: AC

## 2024-06-23 NOTE — Progress Notes (Signed)
 Denies allergies to eggs or soy products. Denies complication of anesthesia or sedation. Denies use of weight loss medication. Denies use of O2.   Emmi instructions given for colonoscopy.   Pre visit was conducted with [employer]. Patient was given instructions in Spanish and English. Patient verbalizes instructions for procedure.

## 2024-06-25 ENCOUNTER — Other Ambulatory Visit: Payer: Self-pay | Admitting: Family Medicine

## 2024-06-25 DIAGNOSIS — I10 Essential (primary) hypertension: Secondary | ICD-10-CM

## 2024-06-30 ENCOUNTER — Telehealth: Payer: Self-pay | Admitting: Pharmacist

## 2024-06-30 ENCOUNTER — Ambulatory Visit: Payer: Self-pay | Admitting: Pharmacist

## 2024-06-30 ENCOUNTER — Encounter: Payer: Self-pay | Admitting: Pharmacist

## 2024-06-30 ENCOUNTER — Other Ambulatory Visit: Payer: Self-pay

## 2024-06-30 DIAGNOSIS — Z7984 Long term (current) use of oral hypoglycemic drugs: Secondary | ICD-10-CM

## 2024-06-30 DIAGNOSIS — E1149 Type 2 diabetes mellitus with other diabetic neurological complication: Secondary | ICD-10-CM | POA: Diagnosis not present

## 2024-06-30 DIAGNOSIS — Z794 Long term (current) use of insulin: Secondary | ICD-10-CM | POA: Diagnosis not present

## 2024-06-30 DIAGNOSIS — Z7985 Long-term (current) use of injectable non-insulin antidiabetic drugs: Secondary | ICD-10-CM | POA: Diagnosis not present

## 2024-06-30 LAB — POCT GLYCOSYLATED HEMOGLOBIN (HGB A1C): HbA1c, POC (controlled diabetic range): 9.9 % — AB (ref 0.0–7.0)

## 2024-06-30 MED ORDER — BENZONATATE 100 MG PO CAPS: 100.0000 mg | ORAL_CAPSULE | Freq: Three times a day (TID) | ORAL | 0 refills | Status: DC | PRN

## 2024-06-30 MED ORDER — OZEMPIC (0.25 OR 0.5 MG/DOSE) 2 MG/3ML ~~LOC~~ SOPN: PEN_INJECTOR | SUBCUTANEOUS | 1 refills | Status: DC

## 2024-06-30 NOTE — Progress Notes (Signed)
 "  S:    AMN interpetersBETHA Douglas, [id number]  No chief complaint on file.  64 y.o. female with PMHx significant for HTN, GERD, T2DM, HLD, hepatic steatosis, and history of uterine cancer/endometrial/colon cancer who presents for diabetes evaluation, education, and management.   Patient was referred and last seen by Dr. Newlin on 05/10/24. She noted that [name] was taking all of her DM medications. No changes were made. I last saw her on 06/02/24 and increased Basaglar  to 16 units once daily. Other antihyperglycemic medications were continued without changes.   Today, she has no complaints regarding her DM. She seems to be doing okay with her medication adherence. Of note, her A1c today is back up to 9.9% (up from 9.1% in October). She tells me today that she is using Basaglar  in the evenings at 16 units and she uses this every day. Endorses adherence to her metformin , glipizide , and pioglitazone .  Insurance: [facility]   Patient reports monitoring blood sugars at home,:  Fasting: 120s - 130s.  Post-prandial/random: names numbers in the low 200s-300s.   Denies hypoglycemic events. Patient denies neuropathy. Has improved from baseline.  Patient denies visual changes. Patient denies self foot exams.   Current diabetes medications include:   -Basaglar  10 units daily (filled 90-day 03/21/24) -glipizide  10 mg XL daily (filled 90-day 05/06/24) -metformin  1000 mg BID (filled 90-day 06/07/24) -pioglitazone  15 mg daily (filled 90-day 05/06/24)  Family/Social History:  Fhx: DM, cirrhosis Tobacco: has never smoked Alcohol:  none  Patient reported dietary habits: eats 3 meals/day but eating smaller portions now  Breakfast: eggs, tortillas sometimes cereal and milk  Lunch: chicken, salad, may be soups  Dinner: fruits, tortillas, beans, eggs Snacks: peanuts (unsalted) Drinks: 3 bottles of water /day; orange or grapefruit juice occasionally, 1 glass with a meal at most    Patient-reported exercise habits: treadmill for 120 minutes Monday-Friday.   O:  Lab Results  Component Value Date   HGBA1C 9.9 (A) 06/30/2024   There were no vitals filed for this visit.  Lipid Panel     Component Value Date/Time   CHOL 159 07/22/2023 1227   TRIG 235 (H) 07/22/2023 1227   HDL 41 07/22/2023 1227   CHOLHDL 3.9 07/22/2023 1227   CHOLHDL 6.7 (H) 06/04/2015 0925   VLDL 67 (H) 06/04/2015 0925   LDLCALC 79 07/22/2023 1227    Clinical Atherosclerotic Cardiovascular Disease (ASCVD): No  The 10-year ASCVD risk score (Arnett DK, et al., 2019) is: 7.9%   Values used to calculate the score:     Age: 11 years     Clinically relevant sex: Female     Is Non-Hispanic African American: No     Diabetic: Yes     Tobacco smoker: No     Systolic Blood Pressure: 111 mmHg     Is BP treated: Yes     HDL Cholesterol: 41 mg/dL     Total Cholesterol: 159 mg/dL   Patient is participating in a Managed Medicaid Plan:  No   A/P: Diabetes longstanding currently uncontrolled with an A1c of 9.9%, up from 9.1% in October. A1c goal is <7%. Medication adherence appears to be near optimal. She is ~1 week behind on insulin  refills but endorses adherence to this and PO medications. While her morning sugars at home appear to be at goal, her sugars after eating are still elevated. She has tried and failed Trulicity  due to side effects. She was unable to try Ozempic  in the past. This appears to  be covered but needs a PA. I went over this process with her today. I will pursue PA approval and reach out to her when/if approved.  -Start Ozempic  0.25 mg once weekly. Will collaborate with pharmacy for PA approval.  -Continue Basaglar  16 units daily in the morning. -Continued Glipizide  XR 10 mg once in the morning  -Continued metformin  1000 mg BID  -Continued pioglitazone  15 mg daily  -Patient educated on purpose, proper use, and potential adverse effects of Basaglar , metformin , glipizide , and  pioglitazone . -Extensively discussed pathophysiology of diabetes, recommended lifestyle interventions, dietary effects on blood sugar control.  -Counseled on s/sx of and management of hypoglycemia.  -Next A1c anticipated 09/2024.  Follow-up:  -PCP 08/14/2024 -Me: in 1 month  Herlene Fleeta Morris, PharmD, [place], CPP Clinical Pharmacist [facility] [phone]    "

## 2024-06-30 NOTE — Telephone Encounter (Signed)
 Can we attempt a PA for this patient's Ozempic . Tried and failed Trulicity  due to GI side effects. Will you let me know once approved?   This section is for me:   Once approved, I will contact patient using interpreter. Preferred number, per patient, is [phone].

## 2024-07-03 ENCOUNTER — Telehealth: Payer: Self-pay

## 2024-07-03 ENCOUNTER — Other Ambulatory Visit: Payer: Self-pay

## 2024-07-03 ENCOUNTER — Encounter: Payer: Self-pay | Admitting: Gastroenterology

## 2024-07-03 NOTE — Telephone Encounter (Signed)
 Pharmacy Patient Advocate Encounter   Received notification from CoverMyMeds that prior authorization for OZEMPIC  is required/requested.   Insurance verification completed.   The patient is insured through [employer].   Per test claim: PA required; PA submitted to above mentioned insurance via CoverMyMeds Key/confirmation #/EOC [id number] Status is pending

## 2024-07-04 ENCOUNTER — Other Ambulatory Visit: Payer: Self-pay

## 2024-07-04 NOTE — Telephone Encounter (Signed)
 Pharmacy Patient Advocate Encounter  Received notification from [facility] MEDICAID that Prior Authorization for OZEMPIC  has been APPROVED from 07/03/2024 to 07/03/2025   PA #/Case ID/Reference #: [id number]

## 2024-07-07 ENCOUNTER — Encounter: Payer: Self-pay | Admitting: Gastroenterology

## 2024-07-07 ENCOUNTER — Ambulatory Visit: Admitting: Gastroenterology

## 2024-07-07 ENCOUNTER — Ambulatory Visit: Payer: Self-pay | Admitting: Pharmacist

## 2024-07-07 VITALS — BP 138/87 | HR 100 | Temp 97.5°F | Resp 11 | Ht 64.0 in | Wt 145.0 lb

## 2024-07-07 DIAGNOSIS — D123 Benign neoplasm of transverse colon: Secondary | ICD-10-CM | POA: Diagnosis not present

## 2024-07-07 DIAGNOSIS — K644 Residual hemorrhoidal skin tags: Secondary | ICD-10-CM

## 2024-07-07 DIAGNOSIS — Z1507 Genetic susceptibility to malignant neoplasm of urinary tract: Secondary | ICD-10-CM

## 2024-07-07 DIAGNOSIS — D127 Benign neoplasm of rectosigmoid junction: Secondary | ICD-10-CM

## 2024-07-07 DIAGNOSIS — K641 Second degree hemorrhoids: Secondary | ICD-10-CM | POA: Diagnosis not present

## 2024-07-07 DIAGNOSIS — K635 Polyp of colon: Secondary | ICD-10-CM | POA: Diagnosis not present

## 2024-07-07 DIAGNOSIS — Z85038 Personal history of other malignant neoplasm of large intestine: Secondary | ICD-10-CM

## 2024-07-07 DIAGNOSIS — Z1211 Encounter for screening for malignant neoplasm of colon: Secondary | ICD-10-CM

## 2024-07-07 MED ORDER — SODIUM CHLORIDE 0.9 % IV SOLN: 500.0000 mL | INTRAVENOUS | Status: AC

## 2024-07-07 NOTE — Progress Notes (Signed)
 Pt's states no medical or surgical changes since previsit or office visit.

## 2024-07-07 NOTE — Progress Notes (Signed)
 While in recovery patient with c/o cramping. Levsin 0.25 mg  given. She is passing gas and had a BM and stated it made her feel better. Upon discharge patient expressed she was still cramping but was okay going home. Patient instructed about the importance of calling the emergency phone number provided in discharge instructions if her pain does not get better or if she has any other symptoms. Patient verbalized understanding. Dr. Wilhelmenia made aware.

## 2024-07-07 NOTE — Patient Instructions (Signed)
 -Espere por los [place] de patologia -Panfletos sobre hemorroides y polipos brindados  USTED TUVO UN PROCEDIMIENTO ENDOSCPICO HOY EN EL [facility]:   Lea el informe del procedimiento que se le entreg para cualquier pregunta especfica sobre lo que se dentist.  Si el informe del examen no responde a sus preguntas, por favor llame a su gastroenterlogo para aclararlo.  Si usted solicit que no se le den lowe's companies de lo que se clinical cytogeneticist en su procedimiento al marathon oil va a cuidar, entonces el informe del procedimiento se ha incluido en un sobre sellado para que usted lo revise despus cuando le sea ms conveniente.   LO QUE PUEDE ESPERAR: Algunas sensaciones de hinchazn en el abdomen.  Puede tener ms gases de lo normal.  El caminar puede ayudarle a eliminar el aire que se le puso en el tracto gastrointestinal durante el procedimiento y reducir la hinchazn.  Si le hicieron una endoscopia inferior (como una colonoscopia o una sigmoidoscopia flexible), podra notar manchas de sangre en las heces fecales o en el papel higinico.  Si se someti a una preparacin intestinal para su procedimiento, es posible que no tenga una evacuacin intestinal normal durante time warner.   Tenga en cuenta:  Es posible que note un poco de irritacin y congestin en la nariz o algn drenaje.  Esto es debido al oxgeno applied materials durante su procedimiento.  No hay que preocuparse y esto debe desaparecer ms o regulatory affairs officer.   SNTOMAS PARA REPORTAR INMEDIATAMENTE:  Despus de una endoscopia inferior (colonoscopia o sigmoidoscopia flexible):  Cantidades excesivas de sangre en las heces fecales  Sensibilidad significativa o empeoramiento de los dolores abdominales   Hinchazn aguda del abdomen que antes no tena   Fiebre de 100F o ms   Para asuntos urgentes o de associate professor, puede comunicarse con un gastroenterlogo a cualquier hora llamando al [phone].  DIETA:   Recomendamos una comida pequea al principio, pero luego puede continuar con su dieta normal.  Tome muchos lquidos, pero debe evitar las bebidas alcohlicas durante 24 horas.    ACTIVIDAD:  Debe planear tomarse las cosas con calma por el resto del da y no debe CONDUCIR ni usar maquinaria pesada patent examiner (debido a los medicamentos de sedacin utilizados durante el examen).     SEGUIMIENTO: Nuestro personal llamar al nmero que aparece en su historial al siguiente da hbil de su procedimiento para ver cmo se siente y para responder cualquier pregunta o inquietud que pueda tener con respecto a la informacin que se le dio despus del procedimiento. Si no podemos contactarle, le dejaremos un mensaje.  Sin embargo, si se siente bien y no tiene english as a second language teacher, no es necesario que nos devuelva la llamada.  Asumiremos que ha regresado a sus actividades diarias normales sin incidentes. Si se le tomaron algunas biopsias, le contactaremos por telfono o por carta en las prximas 3 semanas.  Si no ha sabido [employer] biopsias en el transcurso de 3 semanas, por favor llmenos al [phone].   FIRMAS/CONFIDENCIALIDAD: Usted y/o el acompaante que le cuide han firmado documentos que se ingresarn en su historial mdico electrnico.  Estas firmas atestiguan el hecho de que la informacin anterior   Crecimiento tisular en el colon (plipos en el colon): qu hay que saber Tissue Growths in the Colon (Colon Polyps): What to Know  Estos plipos son un crecimiento de tejido dentro del colon, que es una parte del intestino grueso. Un  plipo puede ser un bulto redondo o un crecimiento con forma de seta. Usted podra tener un plipo o ms de uno. La mayora de los plipos en el colon son benignos. Sin embargo, algunos de ellos pueden volverse malignos con el tiempo. Encontrar y extirpar los plipos con anticipacin es una forma de engineer, manufacturing systems. Cules son las causas? No se conoce la causa exacta  de los plipos en el colon. Qu incrementa el riesgo? Algunos factores pueden volverlo ms propenso a tener plipos en el colon. Estos incluyen: Tener antecedentes familiares de cncer colorrectal o de plipos en el colon. Ser mayor de [address] Ser menor de 45 aos y warehouse manager cualquiera de estas dos enfermedades: Tener antecedentes familiares importantes de cncer colorrectal o de plipos en el colon. Enfermedad gentica que aumenta el riesgo de padecer plipos en el colon. Tener una enfermedad inflamatoria del intestino, como colitis ulcerosa o enfermedad de Crohn. Padecer ciertas afecciones que se transmiten de padres a hijos, como las siguientes: Poliposis adenomatosa familiar (PAF). Sndrome de Personnel Officer. Sndrome de [place]. Sndrome de Peutz-Jeghers. Poliposis asociada a MUTYH (MAP). Ciertos hbitos como: Fumar cigarrillos o beber demasiado alcohol. No hacer suficiente actividad fsica. Tener sobrepeso. Seguir una dieta rica en grasas y carnes rojas, pero escasa en [place]. Haber tenido cncer de nio, el cual se trat con radiacin en el abdomen. Cules son los signos o sntomas? En muchos casos, no hay sntomas. Si tiene sntomas, estos pueden incluir los siguientes: Sangre procedente del recto al defecar. Sangre en las heces. La sangre puede ser de color rojo brillante o de color muy oscuro. Dolor en el vientre. Cambio en los hbitos intestinales, como problemas para defecar (estreimiento) o diarrea. Cmo se diagnostica? Los plipos en el colon se diagnostican mediante una colonoscopa. En este procedimiento, se introduce un colonoscopio flexible con luz propia en la abertura entre las nalgas para que llegue al colon y poder examinar la zona. A veces, los plipos se encuentran cuando se realiza una colonoscopa como parte de un examen de rutina para engineer, site. Cmo se trata? El forensic psychologist en extirpar los plipos encontrados. En su mayora, los plipos pueden  extirparse durante una colonoscopa. Estos plipos, luego, se analizan para landscape architect presencia de cncer. Puede ser necesario un tratamiento adicional segn los sun microsystems. Siga las siguientes instrucciones en su hogar: Comida y bebida  Consuma alimentos con alto contenido de fibra, como frutas, verduras y radiation protection practitioner. Consuma alimentos con alto contenido de calcio y vitamina D. Algunas opciones incluyen [place], [place], [place], huevos, hgado, pescado y [place]. Limite el consumo de alimentos ricos en grasa, como los alimentos fritos y los postres. Limite la cantidad de carne roja, carne precocinada o carne curada, u otro tipo de carne procesada. Algunos ejemplos son los perros calientes, las salchichas, el tocino y los panes de carne. Limite las bebidas azucaradas. Estilo de vida Mantenga un peso saludable. Baje de peso si lo recomienda el mdico. Haga actividad fsica todos los das o como se lo haya indicado el mdico. No  fume, vapee, ni consuma nicotina ni tabaco. No  beba alcohol si: Su mdico le indica no hacerlo. Est embarazada, puede estar embarazada o planea quedar embarazada. Si bebe alcohol: Limite la cantidad que consume: Entre 0 y 1 medida al da si es [place]. Entre 0 y 2 medidas al da si es varn. Sepa cunta cantidad de alcohol tienen las bebidas que toma. En los [address], una medida es una botella de  cerveza de 12 OZ (355 ML), un vaso de vino de 5 OZ (148 ML) o un vaso de una bebida alcohlica de alta graduacin de 1 OZ (44 ML). Instrucciones generales [employer] solo segn lo indicado. Concurra a todas las visitas de seguimiento. Esto incluye realizarse colonoscopas peridicas. Hable con el mdico para saber cundo debe realizarse una colonoscopa. Comunquese con un mdico si: Presenta otro sangrado o sangra ms cuando defeca. Las heces presentan sangre o aument la cantidad en ellas. Hubo un cambio en los hbitos intestinales,  como estreimiento o diarrea. Pierde peso sin ningn motivo conocido. Vomita o siente ganas de vomitar. Solicite ayuda de inmediato si: Tiene dolor muy intenso en el abdomen. Esta informacin no tiene theme park manager el consejo del mdico. Asegrese de hacerle al mdico cualquier pregunta que tenga. Document Revised: 06/22/2023 Document Reviewed: 06/22/2023 Elsevier Patient Education  2025 [employer].  Hemorroides Hemorrhoids Las hemorroides son venas inflamadas que pueden formarse: En el ano (recto). Estas se denominan hemorroides internas. Alrededor de la abertura del ano. Estas se denominan hemorroides externas. La mayora de las hemorroides no causan problemas muy graves. Generalmente mejoran con cambios en el estilo de vida y en lo que se come. Cules son las causas? Tener dificultad para defecar (estreimiento) o heces acuosas (diarrea). Hacer demasiada fuerza al defecar. Embarazo. Tener mucho sobrepeso (obesidad). Permanecer sentado durante mucho tiempo. Andar en bicicleta por un largo tiempo. Levantar objetos pesados u otras cosas que requieren genworth financial. Sexo anal. Cules son los signos o sntomas? Dolor. Picazn o irritacin en el ano. Sangrado proveniente del ano. Prdida de materia fecal. Hinchazn. Uno o ms bultos alrededor de la abertura del ano. Cmo se trata? En la international business machines, las hemorroides pueden tratarse en casa. Es posible que le indiquen lo siguiente: Multimedia Programmer lo que come. Hacer cambios en su estilo de vida. Si estos tratamientos no resultan eficaces, tal vez deba someterse a un procedimiento. Es posible que el mdico deba hacer lo siguiente: Scientific Laboratory Technician bandas de goma en la parte inferior de las hemorroides para hacer que se desprendan. Poner un medicamento dentro de las hemorroides para reducir su tamao. Dirigir un tipo de sports administrator las hemorroides para hacer que se desprendan. Realizar una ciruga para retirar las  hemorroides. Siga estas instrucciones en su casa: Medicamentos Use los medicamentos de venta libre y los recetados solamente como se lo haya indicado el mdico. Use cremas medicadas o medicamentos que se ponen en el ano como se lo haya indicado el mdico. Comida y bebida  Consuma alimentos con alto contenido de fibra. Entre ellos cereales integrales, frijoles, frutos secos, frutas y verduras. Pregntele a su mdico acerca de tomar productos con fibra aadida (suplementos de [place]). Consuma menos grasa. Para esto, puede hacer lo siguiente: Coma productos lcteos descremados. Coma menos carne roja. Evite los alimentos procesados. Beber suficiente lquido para radio producer pis (orina) de color amarillo plido. Control del dolor y la hinchazn  Tome un bao de agua tibia (bao de asiento) durante 20 minutos para engineer, materials. Hgalo 3 o 4 veces al da. Puede hacerlo en una baera. Tambin puede usar un bao de asiento porttil que se pone sobre el inodoro. Si se lo indican, aplique hielo sobre la zona dolorida. Puede ser de ayuda aplicarse hielo the kroger baos con agua tibia. Ponga el hielo en una bolsa plstica. Coloque una toalla entre la piel y la bolsa. Aplique el hielo durante 20 minutos, 2 a 3 veces por  da. Si la piel se le pone de color rojo brillante, quite el hielo de inmediato para evitar daos en la piel. [place] de dao es mayor si no puede sentir dolor, airline pilot o fro. Instrucciones generales Actividad fsica. Consulte al mdico qu tipos de ejercicios son mejores para usted y qu cantidad. Vaya al bao cuando sienta la necesidad de defecar. No espere. Trate de no hacer mucha fuerza al defecar. Mantenga el ano seco y limpio. Use papel higinico hmedo o toallitas humedecidas despus de defecar. No pase mucho tiempo sentado en el inodoro. Comunquese con un mdico si: Tiene dolor e hinchazn que no mejoran con el tratamiento. Tiene dificultad para defecar. No puede  defecar. Tiene dolor o hinchazn en la zona exterior de las hemorroides. Solicite ayuda de inmediato si: Tiene un sangrado del ao que no se detiene. Esta informacin no tiene theme park manager el consejo del mdico. Asegrese de hacerle al mdico cualquier pregunta que tenga. Document Revised: 03/19/2022 Document Reviewed: 03/19/2022 Elsevier Patient Education  2024 [employer].

## 2024-07-07 NOTE — Progress Notes (Signed)
 Report given to PACU, vss

## 2024-07-07 NOTE — Progress Notes (Signed)
 Called to room to assist during endoscopic procedure.  Patient ID and intended procedure confirmed with present staff. Received instructions for my participation in the procedure from the performing physician.

## 2024-07-07 NOTE — Progress Notes (Signed)
 "  GASTROENTEROLOGY PROCEDURE H&P NOTE   Primary Care Physician: Delbert Clam, MD  HPI: Laura Douglas is a 63 y.o. female who presents for Colonoscopy for Lynch Syndrome and history of colon cancer.  Past Medical History:  Diagnosis Date   Blood transfusion without reported diagnosis    Colon cancer [facility])    possible? Patient had surgery in [place]. Denies needing adjuvant therapy. The surgery removed a tumor from the colon.   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   Past Surgical History:  Procedure Laterality Date   ABDOMINAL HYSTERECTOMY     abortions     twice    arm surgery     left arm   COLON SURGERY     COLON SURGERY  2010   tumor removal   COLONOSCOPY  02/17/2018   Mansouraty ta x 2, hems   FRACTURE SURGERY  2006   Lt lower arm   MULTIPLE TOOTH EXTRACTIONS     ROBOTIC ASSISTED TOTAL HYSTERECTOMY WITH BILATERAL SALPINGO OOPHERECTOMY Bilateral 09/11/2014   Procedure: ROBOTIC ASSISTED TOTAL LAPAROSCOPIC HYSTERECTOMY WITH BILATERAL SALPINGO OOPHORECTOMY LYMPH NODE DISSECTION; LYSIS OF ADHESIONS;  Surgeon: Sari Bachelor, MD;  Location: [facility];  Service: Gynecology;  Laterality: Bilateral;   TUBAL LIGATION  1993   UPPER GASTROINTESTINAL ENDOSCOPY     Current Outpatient Medications  Medication Sig Dispense Refill   atorvastatin  (LIPITOR) 40 MG tablet Take 1 tablet by mouth once daily 90 tablet 1   benzonatate  (TESSALON ) 100 MG capsule Take 1 capsule (100 mg total) by mouth 3 (three) times daily as needed for cough. 20 capsule 0   Blood Glucose Monitoring Suppl (AGAMATRIX PRESTO) w/Device KIT Check sugars before meals twice sugars 1 kit 0   cephALEXin  (KEFLEX ) 500 MG capsule Take 1 capsule (500 mg total) by mouth 2  (two) times daily. (Patient not taking: Reported on 06/23/2024) 14 capsule 0   fluconazole  (DIFLUCAN ) 150 MG tablet Take 1 tablet (150 mg total) by mouth every 3 (three) days. 2 tablet 0   glipiZIDE  (GLUCOTROL  XL) 10 MG 24 hr tablet Take 1 tablet (10 mg total) by mouth daily with breakfast. 90 tablet 1   hydrochlorothiazide  (HYDRODIURIL ) 25 MG tablet Take 1 tablet by mouth once daily 90 tablet 1   Insulin  Glargine (BASAGLAR  KWIKPEN) 100 UNIT/ML Inject 16 Units into the skin daily. 15 mL 1   Insulin  Pen Needle (PEN NEEDLES) 32G X 4 MM MISC Use to inject insulin  once daily. 100 each 3   lisinopril  (ZESTRIL ) 5 MG tablet TAKE 1 TABLET BY MOUTH ONCE DAILY WITH HCTZ 90 tablet 1   metFORMIN  (GLUCOPHAGE ) 1000 MG tablet Take 1 tablet (1,000 mg total) by mouth 2 (two) times daily with a meal. 180 tablet 1   OVER THE COUNTER MEDICATION Blood Sugar Complex, one capsule daily.     pantoprazole  (PROTONIX ) 40 MG tablet Take 1 tablet (40 mg total) by mouth 2 (two) times daily before a meal. 60 tablet 3   phenazopyridine  (PYRIDIUM ) 200 MG tablet Take 1 tablet (200 mg total) by mouth 3 (three) times daily. (Patient not taking: Reported on 06/23/2024) 6 tablet 0   pioglitazone  (ACTOS ) 15 MG tablet Take  1 tablet (15 mg total) by mouth daily. 90 tablet 1   Semaglutide ,0.25 or 0.5MG /DOS, (OZEMPIC , 0.25 OR 0.5 MG/DOSE,) 2 MG/3ML SOPN Inject 0.25 mg into the skin once a week for 4 weeks. Then, increase to 0.5 mg into the skin once a week thereafter. 3 mL 1   No current facility-administered medications for this visit.   Current Medications[1] Allergies[2] Family History  Problem Relation Age of Onset   Diabetes Mother    Colon cancer Father    Prostate cancer Father    Uterine cancer Sister 52   Cancer Sister        colon cancer as well age 23?   Colon cancer Sister    Alcohol abuse Brother    Cirrhosis Brother        cause of death   Uterine cancer Maternal Aunt    Breast cancer Neg Hx    Rectal cancer Neg Hx     Stomach cancer Neg Hx    Esophageal cancer Neg Hx    Social History   Socioeconomic History   Marital status: Married    Spouse name: Not on file   Number of children: 4   Years of education: Not on file   Highest education level: Not on file  Occupational History   Occupation: Factory work.  Tobacco Use   Smoking status: Never   Smokeless tobacco: Never  Vaping Use   Vaping status: Never Used  Substance and Sexual Activity   Alcohol use: No   Drug use: No   Sexual activity: Yes    Birth control/protection: Surgical    Comment: Hysterectomy  Other Topics Concern   Not on file  Social History Narrative   Lives at home with her female partner   Only son killed by gang violence in [place].   Social Drivers of Health   Tobacco Use: Low Risk (06/30/2024)   Patient History    Smoking Tobacco Use: Never    Smokeless Tobacco Use: Never    Passive Exposure: Not on file  Financial Resource Strain: Patient Declined (07/01/2023)   Overall Financial Resource Strain (CARDIA)    Difficulty of Paying Living Expenses: Patient declined  Food Insecurity: No Food Insecurity (07/01/2023)   Hunger Vital Sign    Worried About Running Out of Food in the Last Year: Never true    Ran Out of Food in the Last Year: Never true  Transportation Needs: No Transportation Needs (07/01/2023)   PRAPARE - Administrator, Civil Service (Medical): No    Lack of Transportation (Non-Medical): No  Physical Activity: Unknown (07/01/2023)   Exercise Vital Sign    Days of Exercise per Week: Not on file    Minutes of Exercise per Session: 60 min  Stress: Patient Declined (07/01/2023)   [employer] of Occupational Health - Occupational Stress Questionnaire    Feeling of Stress : Patient declined  Social Connections: Socially Integrated (07/01/2023)   Social Connection and Isolation Panel    Frequency of Communication with Friends and Family: Twice a week    Frequency of Social Gatherings with  Friends and Family: Once a week    Attends Religious Services: 1 to 4 times per year    Active Member of [employer] or Organizations: Yes    Attends Banker Meetings: 1 to 4 times per year    Marital Status: Living with partner  Intimate Partner Violence: Not At Risk (07/01/2023)   Humiliation, Afraid, Rape, and Kick questionnaire  Fear of Current or Ex-Partner: No    Emotionally Abused: No    Physically Abused: No    Sexually Abused: No  Depression (PHQ2-9): Low Risk (05/10/2024)   Depression (PHQ2-9)    PHQ-2 Score: 0  Alcohol Screen: Low Risk (07/01/2023)   Alcohol Screen    Last Alcohol Screening Score (AUDIT): 0  Housing: Low Risk (07/01/2023)   Housing Stability Vital Sign    Unable to Pay for Housing in the Last Year: No    Number of Times Moved in the Last Year: 0    Homeless in the Last Year: No  Utilities: Not At Risk (07/01/2023)   AHC Utilities    Threatened with loss of utilities: No  Health Literacy: Adequate Health Literacy (07/01/2023)   B1300 Health Literacy    Frequency of need for help with medical instructions: Never    Physical Exam: There were no vitals filed for this visit. There is no height or weight on file to calculate BMI. GEN: NAD EYE: Sclerae anicteric ENT: MMM CV: Non-tachycardic GI: Soft, NT/ND NEURO:  Alert & Oriented x 3  Lab Results: No results for input(s): WBC, HGB, HCT, PLT in the last 72 hours. BMET No results for input(s): NA, K, CL, CO2, GLUCOSE, BUN, CREATININE, CALCIUM  in the last 72 hours. LFT No results for input(s): PROT, ALBUMIN, AST, ALT, ALKPHOS, BILITOT, BILIDIR, IBILI in the last 72 hours. PT/INR No results for input(s): LABPROT, INR in the last 72 hours.   Impression / Plan: This is a 64 y.o.female who presents for Colonoscopy for Lynch Syndrome and history of colon cancer.  The risks and benefits of endoscopic evaluation/treatment were discussed with the patient  and/or family; these include but are not limited to the risk of perforation, infection, bleeding, missed lesions, lack of diagnosis, severe illness requiring hospitalization, as well as anesthesia and sedation related illnesses.  The patient's history has been reviewed, patient examined, no change in status, and deemed stable for procedure.  The patient and/or family was provided an opportunity to ask questions and all were answered.  The patient and/or family is agreeable to proceed.    Aloha Finner, MD [facility] Office # [phone]    [1]  Current Outpatient Medications:    atorvastatin  (LIPITOR) 40 MG tablet, Take 1 tablet by mouth once daily, Disp: 90 tablet, Rfl: 1   benzonatate  (TESSALON ) 100 MG capsule, Take 1 capsule (100 mg total) by mouth 3 (three) times daily as needed for cough., Disp: 20 capsule, Rfl: 0   Blood Glucose Monitoring Suppl (AGAMATRIX PRESTO) w/Device KIT, Check sugars before meals twice sugars, Disp: 1 kit, Rfl: 0   cephALEXin  (KEFLEX ) 500 MG capsule, Take 1 capsule (500 mg total) by mouth 2 (two) times daily. (Patient not taking: Reported on 06/23/2024), Disp: 14 capsule, Rfl: 0   fluconazole  (DIFLUCAN ) 150 MG tablet, Take 1 tablet (150 mg total) by mouth every 3 (three) days., Disp: 2 tablet, Rfl: 0   glipiZIDE  (GLUCOTROL  XL) 10 MG 24 hr tablet, Take 1 tablet (10 mg total) by mouth daily with breakfast., Disp: 90 tablet, Rfl: 1   hydrochlorothiazide  (HYDRODIURIL ) 25 MG tablet, Take 1 tablet by mouth once daily, Disp: 90 tablet, Rfl: 1   Insulin  Glargine (BASAGLAR  KWIKPEN) 100 UNIT/ML, Inject 16 Units into the skin daily., Disp: 15 mL, Rfl: 1   Insulin  Pen Needle (PEN NEEDLES) 32G X 4 MM MISC, Use to inject insulin  once daily., Disp: 100 each, Rfl: 3   lisinopril  (  ZESTRIL ) 5 MG tablet, TAKE 1 TABLET BY MOUTH ONCE DAILY WITH HCTZ, Disp: 90 tablet, Rfl: 1   metFORMIN  (GLUCOPHAGE ) 1000 MG tablet, Take 1 tablet (1,000 mg total) by  mouth 2 (two) times daily with a meal., Disp: 180 tablet, Rfl: 1   OVER THE COUNTER MEDICATION, Blood Sugar Complex, one capsule daily., Disp: , Rfl:    pantoprazole  (PROTONIX ) 40 MG tablet, Take 1 tablet (40 mg total) by mouth 2 (two) times daily before a meal., Disp: 60 tablet, Rfl: 3   phenazopyridine  (PYRIDIUM ) 200 MG tablet, Take 1 tablet (200 mg total) by mouth 3 (three) times daily. (Patient not taking: Reported on 06/23/2024), Disp: 6 tablet, Rfl: 0   pioglitazone  (ACTOS ) 15 MG tablet, Take 1 tablet (15 mg total) by mouth daily., Disp: 90 tablet, Rfl: 1   Semaglutide ,0.25 or 0.5MG /DOS, (OZEMPIC , 0.25 OR 0.5 MG/DOSE,) 2 MG/3ML SOPN, Inject 0.25 mg into the skin once a week for 4 weeks. Then, increase to 0.5 mg into the skin once a week thereafter., Disp: 3 mL, Rfl: 1 [2] No Known Allergies  "

## 2024-07-07 NOTE — Op Note (Signed)
 [facility] Patient Name: Laura Douglas Procedure Date: 07/07/2024 8:21 AM MRN: [id number] Endoscopist: Aloha Finner , MD, [id number] Age: 64 Referring MD:  Date of Birth: 1961/05/10 Gender: Female Account #: [id number] Procedure:                Colonoscopy Indications:              High risk colon cancer surveillance: Personal                            history of colon cancer, High risk colon cancer                            surveillance: Personal history of hereditary                            nonpolyposis colorectal cancer (Lynch Syndrome) Medicines:                Monitored Anesthesia Care Procedure:                Pre-Anesthesia Assessment:                           - Prior to the procedure, a History and Physical                            was performed, and patient medications and                            allergies were reviewed. The patient's tolerance of                            previous anesthesia was also reviewed. The risks                            and benefits of the procedure and the sedation                            options and risks were discussed with the patient.                            All questions were answered, and informed consent                            was obtained. Prior Anticoagulants: The patient has                            taken no anticoagulant or antiplatelet agents. ASA                            Grade Assessment: II - A patient with mild systemic                            disease. After reviewing the risks and benefits,  the patient was deemed in satisfactory condition to                            undergo the procedure.                           After obtaining informed consent, the colonoscope                            was passed under direct vision. Throughout the                            procedure, the patient's blood pressure, pulse, and                            oxygen  saturations were monitored continuously. The                            Olympus Scope SN [id number] was introduced through the                            anus and advanced to the the ileocolonic                            anastomosis. The colonoscopy was performed without                            difficulty. The patient tolerated the procedure.                            The quality of the bowel preparation was adequate. Scope In: 8:39:00 AM Scope Out: 8:56:45 AM Scope Withdrawal Time: 0 hours 14 minutes 10 seconds  Total Procedure Duration: 0 hours 17 minutes 45 seconds  Findings:                 The digital rectal exam findings include                            hemorrhoids. Pertinent negatives include no                            palpable rectal lesions.                           There was evidence of a prior functional end-to-end                            ileo-colonic anastomosis in the ascending colon.                            This was patent and was characterized by healthy                            appearing mucosa.  Five sessile polyps were found in the recto-sigmoid                            colon (1), transverse colon (2) and hepatic flexure                            (2). The polyps were 2 to 14 mm in size. These                            polyps were removed with a cold snare. Resection                            and retrieval were complete.                           Normal mucosa was found in the entire colon                            otherwise.                           Non-bleeding non-thrombosed external and internal                            hemorrhoids were found during retroflexion, during                            perianal exam and during digital exam. The                            hemorrhoids were Grade II (internal hemorrhoids                            that prolapse but reduce spontaneously). Complications:            No immediate  complications. Estimated Blood Loss:     Estimated blood loss was minimal. Impression:               - Hemorrhoids found on digital rectal exam.                           - Patent functional end-to-end ileo-colonic                            anastomosis, characterized by healthy appearing                            mucosa.                           - Five 2 to 14 mm polyps at the recto-sigmoid                            colon, in the transverse colon and at the hepatic  flexure, removed with a cold snare. Resected and                            retrieved.                           - Normal mucosa in the entire examined colon                            otherwise.                           - Non-bleeding non-thrombosed external and internal                            hemorrhoids. Recommendation:           - The patient will be observed post-procedure,                            until all discharge criteria are met.                           - Discharge patient to home.                           - Patient has a contact number available for                            emergencies. The signs and symptoms of potential                            delayed complications were discussed with the                            patient. Return to normal activities tomorrow.                            Written discharge instructions were provided to the                            patient.                           - High fiber diet.                           - Use FiberCon 1-2 tablets PO daily.                           - Continue present medications.                           - Await pathology results.                           - Repeat colonoscopy in 1 year for surveillance due  to Lynch Syndrome.                           - The findings and recommendations were discussed                            with the patient.                           - The  findings and recommendations were discussed                            with the designated responsible adult. Aloha Finner, MD 07/07/2024 9:00:56 AM

## 2024-07-11 ENCOUNTER — Telehealth: Payer: Self-pay

## 2024-07-11 NOTE — Telephone Encounter (Signed)
 Attempted to reach patient for follow up phone call. No answer, unable to leave voicemail due to voicemail not set up.

## 2024-07-13 LAB — SURGICAL PATHOLOGY

## 2024-07-15 ENCOUNTER — Ambulatory Visit: Payer: Self-pay | Admitting: Gastroenterology

## 2024-07-31 ENCOUNTER — Ambulatory Visit: Payer: Self-pay | Admitting: Pharmacist

## 2024-08-14 ENCOUNTER — Ambulatory Visit: Admitting: Family Medicine
